# Patient Record
Sex: Female | Born: 1940
Health system: Southern US, Community
[De-identification: ages and names within clinical notes are randomized; demographics above are authoritative.]

## PROBLEM LIST (undated history)

## (undated) DIAGNOSIS — E039 Hypothyroidism, unspecified: Secondary | ICD-10-CM

## (undated) DIAGNOSIS — E782 Mixed hyperlipidemia: Secondary | ICD-10-CM

## (undated) DIAGNOSIS — I4891 Unspecified atrial fibrillation: Secondary | ICD-10-CM

## (undated) DIAGNOSIS — I350 Nonrheumatic aortic (valve) stenosis: Secondary | ICD-10-CM

## (undated) DIAGNOSIS — L409 Psoriasis, unspecified: Secondary | ICD-10-CM

## (undated) DIAGNOSIS — M1990 Unspecified osteoarthritis, unspecified site: Secondary | ICD-10-CM

## (undated) DIAGNOSIS — I10 Essential (primary) hypertension: Secondary | ICD-10-CM

## (undated) DIAGNOSIS — I2510 Atherosclerotic heart disease of native coronary artery without angina pectoris: Secondary | ICD-10-CM

## (undated) HISTORY — DX: Essential (primary) hypertension: I10

## (undated) HISTORY — DX: Unspecified osteoarthritis, unspecified site: M19.90

## (undated) HISTORY — DX: Atherosclerotic heart disease of native coronary artery without angina pectoris: I25.10

## (undated) HISTORY — DX: Mixed hyperlipidemia: E78.2

## (undated) HISTORY — DX: Nonrheumatic aortic (valve) stenosis: I35.0

## (undated) HISTORY — DX: Unspecified atrial fibrillation: I48.91

## (undated) HISTORY — DX: Hypothyroidism, unspecified: E03.9

## (undated) HISTORY — PX: CHOLECYSTECTOMY: SHX55

---

## 1968-06-24 HISTORY — PX: TOTAL THYROIDECTOMY: SHX2547

## 2001-01-22 ENCOUNTER — Encounter: Admission: RE | Admit: 2001-01-22 | Discharge: 2001-01-22 | Payer: Self-pay

## 2001-02-05 ENCOUNTER — Encounter: Payer: Self-pay | Admitting: Unknown Physician Specialty

## 2001-02-05 ENCOUNTER — Encounter: Admission: RE | Admit: 2001-02-05 | Discharge: 2001-02-05 | Payer: Self-pay | Admitting: Unknown Physician Specialty

## 2001-02-19 ENCOUNTER — Encounter: Payer: Self-pay | Admitting: Unknown Physician Specialty

## 2001-02-19 ENCOUNTER — Encounter: Admission: RE | Admit: 2001-02-19 | Discharge: 2001-02-19 | Payer: Self-pay | Admitting: Unknown Physician Specialty

## 2004-07-31 ENCOUNTER — Ambulatory Visit: Payer: Self-pay | Admitting: Cardiology

## 2004-09-10 ENCOUNTER — Ambulatory Visit: Payer: Self-pay | Admitting: Cardiology

## 2005-04-09 ENCOUNTER — Ambulatory Visit: Payer: Self-pay | Admitting: Cardiology

## 2005-06-25 ENCOUNTER — Ambulatory Visit: Payer: Self-pay | Admitting: Cardiology

## 2005-10-16 ENCOUNTER — Ambulatory Visit: Payer: Self-pay | Admitting: Cardiology

## 2005-10-28 ENCOUNTER — Ambulatory Visit: Payer: Self-pay | Admitting: Cardiology

## 2007-01-15 ENCOUNTER — Ambulatory Visit: Payer: Self-pay | Admitting: Cardiology

## 2007-02-03 ENCOUNTER — Ambulatory Visit: Payer: Self-pay | Admitting: Cardiology

## 2007-03-18 ENCOUNTER — Ambulatory Visit: Payer: Self-pay | Admitting: Gastroenterology

## 2007-03-18 LAB — CONVERTED CEMR LAB
INR: 0.9 (ref 0.8–1.0)
Prothrombin Time: 11.2 s (ref 10.9–13.3)
aPTT: 26.2 s (ref 21.7–29.8)

## 2007-03-26 ENCOUNTER — Ambulatory Visit (HOSPITAL_COMMUNITY): Admission: RE | Admit: 2007-03-26 | Discharge: 2007-03-26 | Payer: Self-pay | Admitting: Gastroenterology

## 2007-03-26 ENCOUNTER — Encounter: Payer: Self-pay | Admitting: Gastroenterology

## 2007-04-06 ENCOUNTER — Ambulatory Visit: Payer: Self-pay | Admitting: Gastroenterology

## 2007-06-25 HISTORY — PX: AORTIC VALVE REPLACEMENT: SHX41

## 2007-06-25 HISTORY — PX: CORONARY ARTERY BYPASS GRAFT: SHX141

## 2007-07-15 ENCOUNTER — Ambulatory Visit: Payer: Self-pay | Admitting: Cardiology

## 2007-09-04 DIAGNOSIS — Z8679 Personal history of other diseases of the circulatory system: Secondary | ICD-10-CM | POA: Insufficient documentation

## 2007-09-04 DIAGNOSIS — I359 Nonrheumatic aortic valve disorder, unspecified: Secondary | ICD-10-CM

## 2007-09-24 ENCOUNTER — Ambulatory Visit: Payer: Self-pay | Admitting: Cardiology

## 2007-11-24 ENCOUNTER — Ambulatory Visit: Payer: Self-pay | Admitting: Cardiology

## 2007-12-08 ENCOUNTER — Inpatient Hospital Stay (HOSPITAL_BASED_OUTPATIENT_CLINIC_OR_DEPARTMENT_OTHER): Admission: RE | Admit: 2007-12-08 | Discharge: 2007-12-08 | Payer: Self-pay | Admitting: Internal Medicine

## 2007-12-08 ENCOUNTER — Inpatient Hospital Stay (HOSPITAL_COMMUNITY): Admission: AD | Admit: 2007-12-08 | Discharge: 2007-12-23 | Payer: Self-pay | Admitting: Internal Medicine

## 2007-12-08 ENCOUNTER — Ambulatory Visit: Payer: Self-pay | Admitting: Internal Medicine

## 2007-12-09 ENCOUNTER — Encounter: Payer: Self-pay | Admitting: Internal Medicine

## 2007-12-10 ENCOUNTER — Encounter: Payer: Self-pay | Admitting: Thoracic Surgery (Cardiothoracic Vascular Surgery)

## 2007-12-12 ENCOUNTER — Encounter: Payer: Self-pay | Admitting: Cardiology

## 2007-12-15 ENCOUNTER — Ambulatory Visit: Payer: Self-pay | Admitting: Thoracic Surgery (Cardiothoracic Vascular Surgery)

## 2007-12-16 ENCOUNTER — Encounter: Payer: Self-pay | Admitting: Cardiology

## 2007-12-17 ENCOUNTER — Encounter: Payer: Self-pay | Admitting: Cardiology

## 2007-12-30 ENCOUNTER — Ambulatory Visit: Payer: Self-pay | Admitting: Thoracic Surgery (Cardiothoracic Vascular Surgery)

## 2008-01-05 ENCOUNTER — Ambulatory Visit: Payer: Self-pay | Admitting: Cardiology

## 2008-01-06 ENCOUNTER — Encounter: Payer: Self-pay | Admitting: Cardiology

## 2008-01-11 ENCOUNTER — Ambulatory Visit: Payer: Self-pay | Admitting: Thoracic Surgery (Cardiothoracic Vascular Surgery)

## 2008-01-18 ENCOUNTER — Ambulatory Visit: Payer: Self-pay | Admitting: Thoracic Surgery (Cardiothoracic Vascular Surgery)

## 2008-02-01 ENCOUNTER — Encounter
Admission: RE | Admit: 2008-02-01 | Discharge: 2008-02-01 | Payer: Self-pay | Admitting: Thoracic Surgery (Cardiothoracic Vascular Surgery)

## 2008-02-01 ENCOUNTER — Ambulatory Visit: Payer: Self-pay | Admitting: Thoracic Surgery (Cardiothoracic Vascular Surgery)

## 2008-02-08 ENCOUNTER — Encounter: Payer: Self-pay | Admitting: Cardiology

## 2008-02-16 ENCOUNTER — Ambulatory Visit: Payer: Self-pay | Admitting: Cardiology

## 2008-02-22 ENCOUNTER — Ambulatory Visit: Payer: Self-pay | Admitting: Thoracic Surgery (Cardiothoracic Vascular Surgery)

## 2008-03-14 ENCOUNTER — Ambulatory Visit: Payer: Self-pay | Admitting: Thoracic Surgery (Cardiothoracic Vascular Surgery)

## 2008-03-28 ENCOUNTER — Ambulatory Visit: Payer: Self-pay | Admitting: Thoracic Surgery (Cardiothoracic Vascular Surgery)

## 2008-04-01 ENCOUNTER — Ambulatory Visit (HOSPITAL_COMMUNITY)
Admission: RE | Admit: 2008-04-01 | Discharge: 2008-04-01 | Payer: Self-pay | Admitting: Thoracic Surgery (Cardiothoracic Vascular Surgery)

## 2008-04-01 ENCOUNTER — Ambulatory Visit: Payer: Self-pay | Admitting: Thoracic Surgery (Cardiothoracic Vascular Surgery)

## 2008-04-05 ENCOUNTER — Ambulatory Visit: Payer: Self-pay | Admitting: Thoracic Surgery (Cardiothoracic Vascular Surgery)

## 2008-04-18 ENCOUNTER — Ambulatory Visit: Payer: Self-pay | Admitting: Cardiology

## 2008-04-25 ENCOUNTER — Ambulatory Visit: Payer: Self-pay | Admitting: Thoracic Surgery (Cardiothoracic Vascular Surgery)

## 2008-06-06 ENCOUNTER — Ambulatory Visit: Payer: Self-pay | Admitting: Thoracic Surgery (Cardiothoracic Vascular Surgery)

## 2008-07-04 ENCOUNTER — Encounter
Admission: RE | Admit: 2008-07-04 | Discharge: 2008-07-04 | Payer: Self-pay | Admitting: Thoracic Surgery (Cardiothoracic Vascular Surgery)

## 2008-07-04 ENCOUNTER — Ambulatory Visit: Payer: Self-pay | Admitting: Thoracic Surgery (Cardiothoracic Vascular Surgery)

## 2008-07-13 ENCOUNTER — Ambulatory Visit: Payer: Self-pay | Admitting: Thoracic Surgery (Cardiothoracic Vascular Surgery)

## 2008-07-13 ENCOUNTER — Ambulatory Visit (HOSPITAL_COMMUNITY)
Admission: RE | Admit: 2008-07-13 | Discharge: 2008-07-13 | Payer: Self-pay | Admitting: Thoracic Surgery (Cardiothoracic Vascular Surgery)

## 2008-08-01 ENCOUNTER — Ambulatory Visit: Payer: Self-pay | Admitting: Thoracic Surgery (Cardiothoracic Vascular Surgery)

## 2008-08-15 ENCOUNTER — Ambulatory Visit: Payer: Self-pay | Admitting: Thoracic Surgery (Cardiothoracic Vascular Surgery)

## 2008-09-05 ENCOUNTER — Ambulatory Visit: Payer: Self-pay | Admitting: Thoracic Surgery (Cardiothoracic Vascular Surgery)

## 2008-09-28 ENCOUNTER — Ambulatory Visit: Payer: Self-pay | Admitting: Cardiology

## 2008-11-07 ENCOUNTER — Encounter: Payer: Self-pay | Admitting: Cardiology

## 2008-11-07 ENCOUNTER — Ambulatory Visit: Payer: Self-pay | Admitting: Thoracic Surgery (Cardiothoracic Vascular Surgery)

## 2008-11-14 ENCOUNTER — Encounter: Payer: Self-pay | Admitting: Cardiology

## 2009-04-06 DIAGNOSIS — E785 Hyperlipidemia, unspecified: Secondary | ICD-10-CM | POA: Insufficient documentation

## 2009-04-06 DIAGNOSIS — I10 Essential (primary) hypertension: Secondary | ICD-10-CM | POA: Insufficient documentation

## 2009-07-12 ENCOUNTER — Telehealth (INDEPENDENT_AMBULATORY_CARE_PROVIDER_SITE_OTHER): Payer: Self-pay | Admitting: *Deleted

## 2009-07-14 ENCOUNTER — Telehealth (INDEPENDENT_AMBULATORY_CARE_PROVIDER_SITE_OTHER): Payer: Self-pay | Admitting: *Deleted

## 2009-07-15 IMAGING — CR DG CHEST 2V
2 series · 2 of 2 positions shown · non-contrast
Comparison: None available.

CLINICAL DATA: Pre CABG.

CHEST - 2 VIEW

[w chest pa]
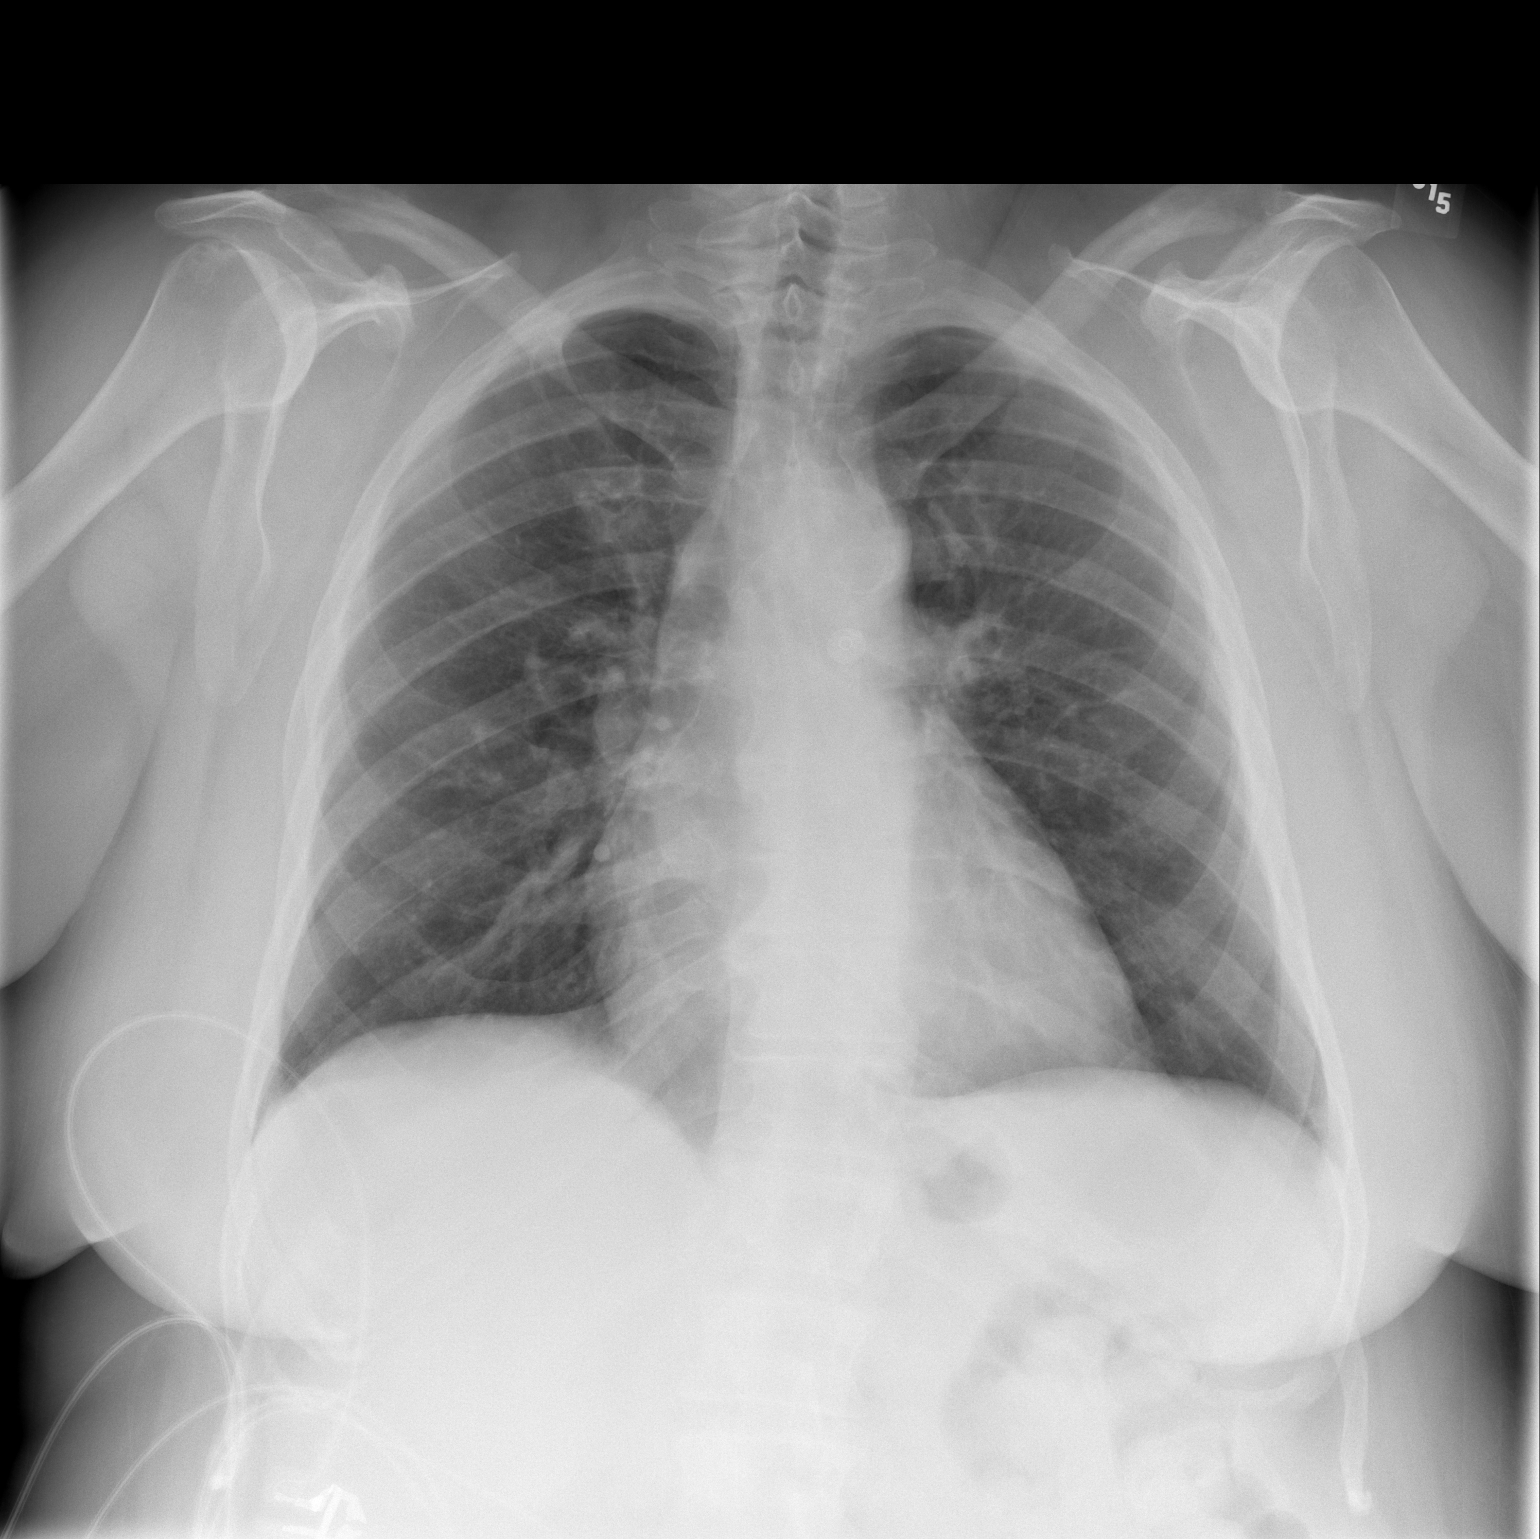

[w chest lat]
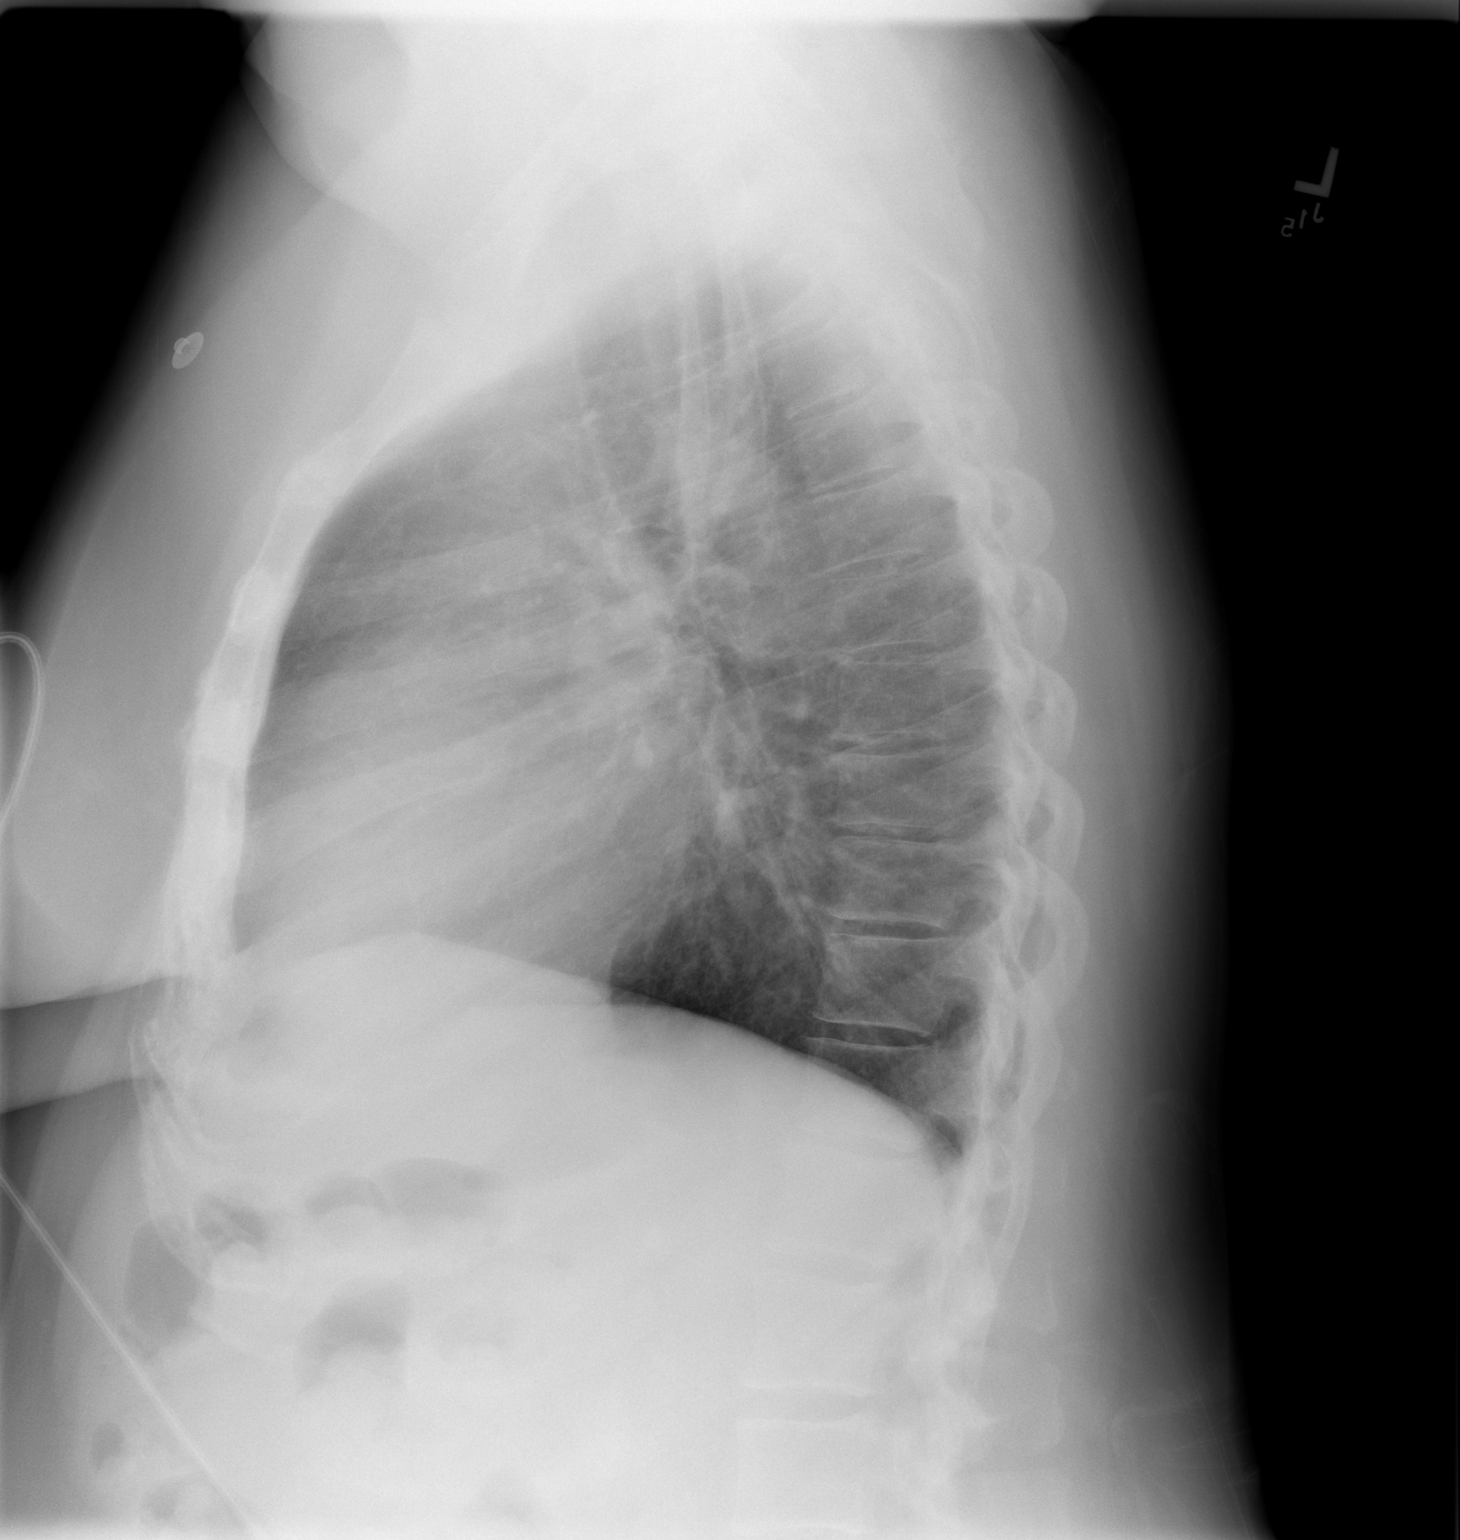

[2 of 2 positions shown; findings below may reference images not displayed]

FINDINGS: Trachea is midline.  Heart is at the upper limits of
normal in size.  Lungs are clear.  No pleural fluid.
IMPRESSION: No acute findings.

## 2009-07-17 IMAGING — CR DG CHEST 1V PORT
1 series · 1 of 1 positions shown · non-contrast
Comparison: 12/10/2007.

CLINICAL DATA: Postop CABG.

PORTABLE CHEST - 1 VIEW

[view not recorded]
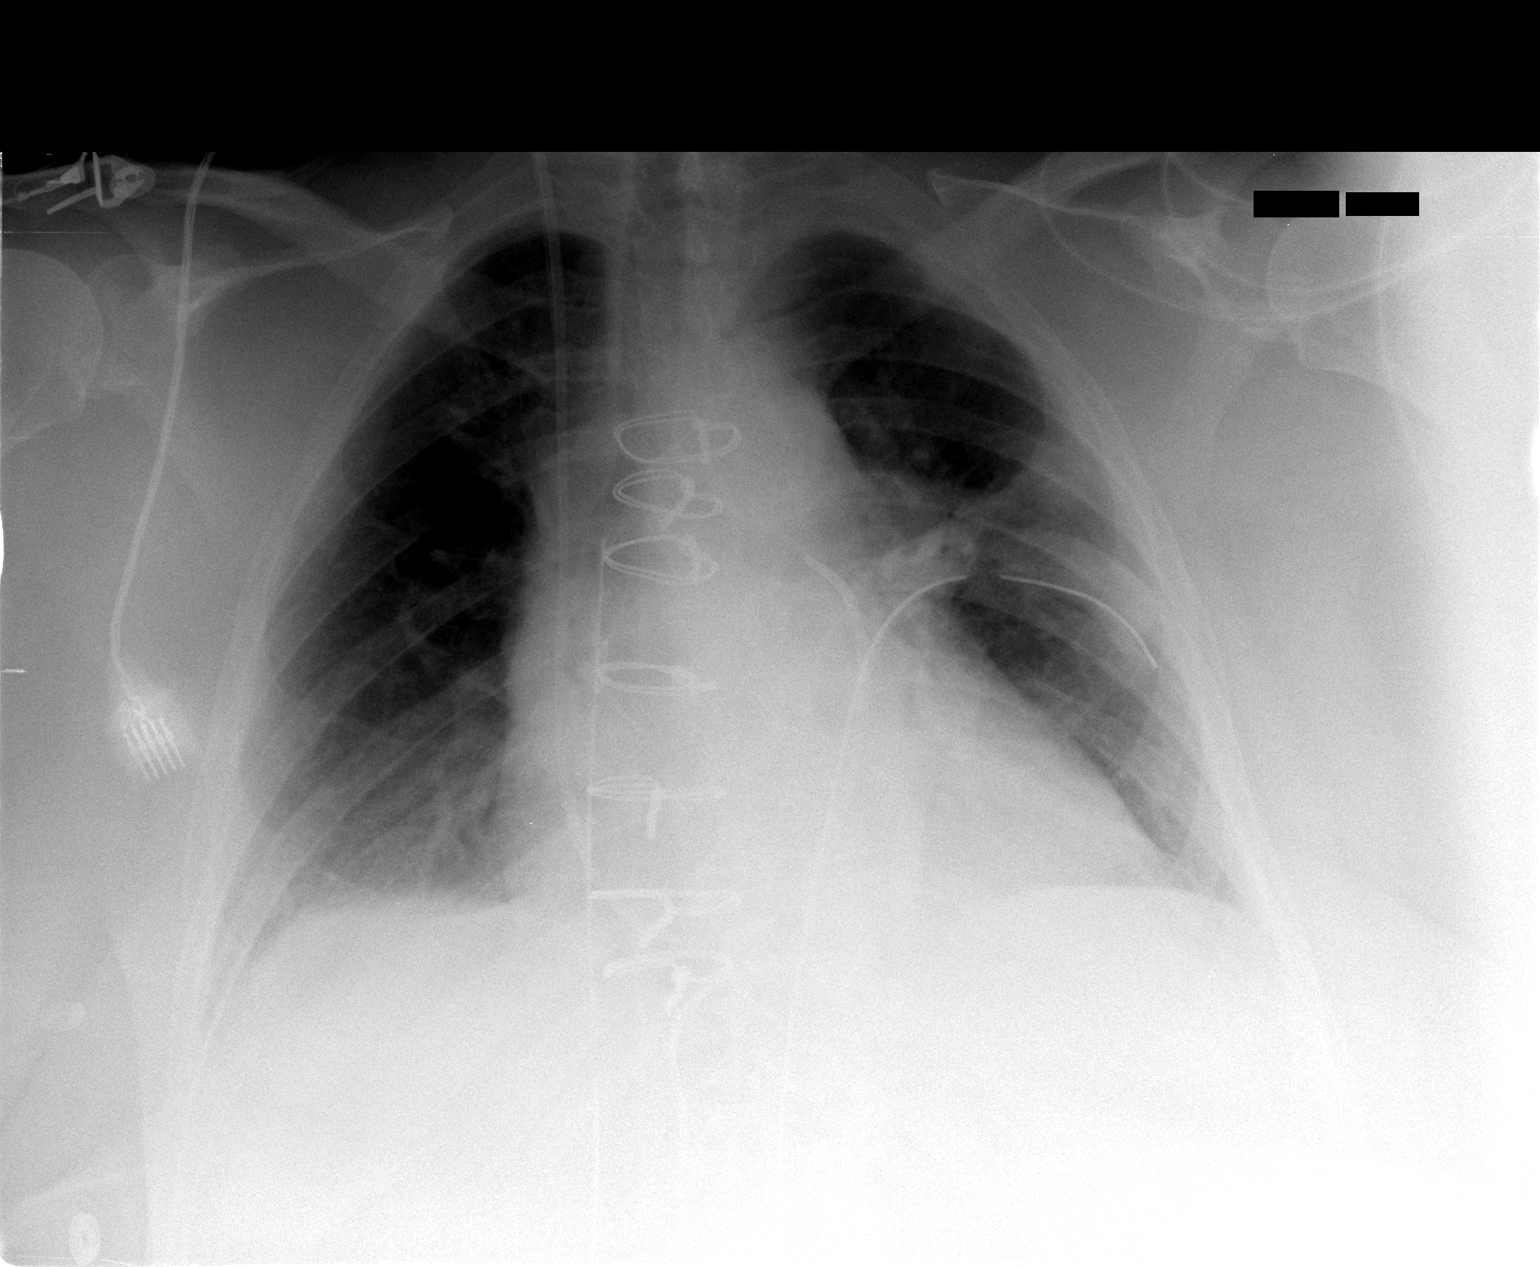

[1 of 1 positions shown; findings below may reference images not displayed]

FINDINGS: 5544 hours.  There has been interval extubation with
removal of the nasogastric tube.  A Swan-Ganz catheter and
bilateral chest tubes are in stable position.  The basilar aeration
has improved with mild residual basilar atelectasis and vascular
congestion.  There is no pneumothorax or significant pleural
effusion.  The heart size and mediastinal contours are stable.
IMPRESSION: 1.  Improved basilar aeration following extubation.
2.  No pneumothorax.

## 2009-07-18 IMAGING — CR DG CHEST 2V
2 series · 2 of 2 positions shown · non-contrast
Comparison: Yesterday's exam

CLINICAL DATA: CABG

CHEST - 2 VIEW

[w chest pa]
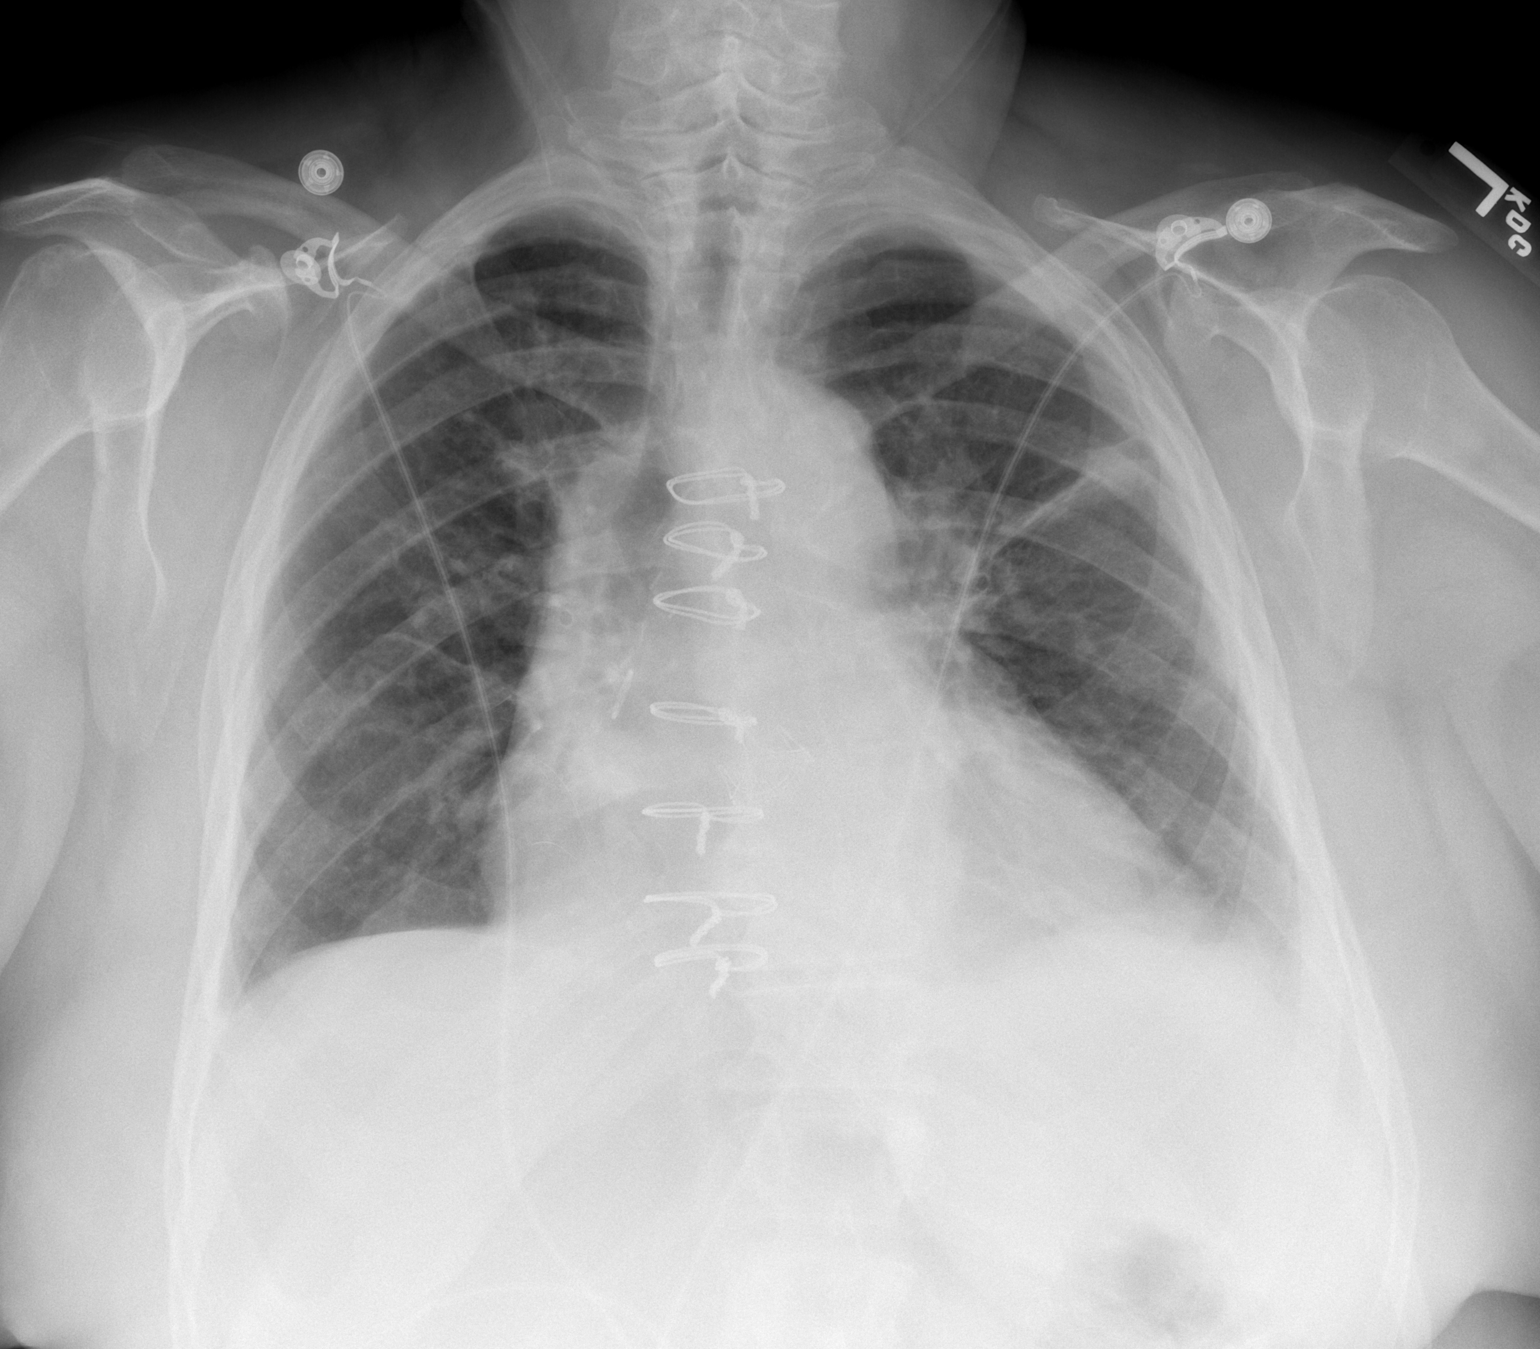

[w chest lat]
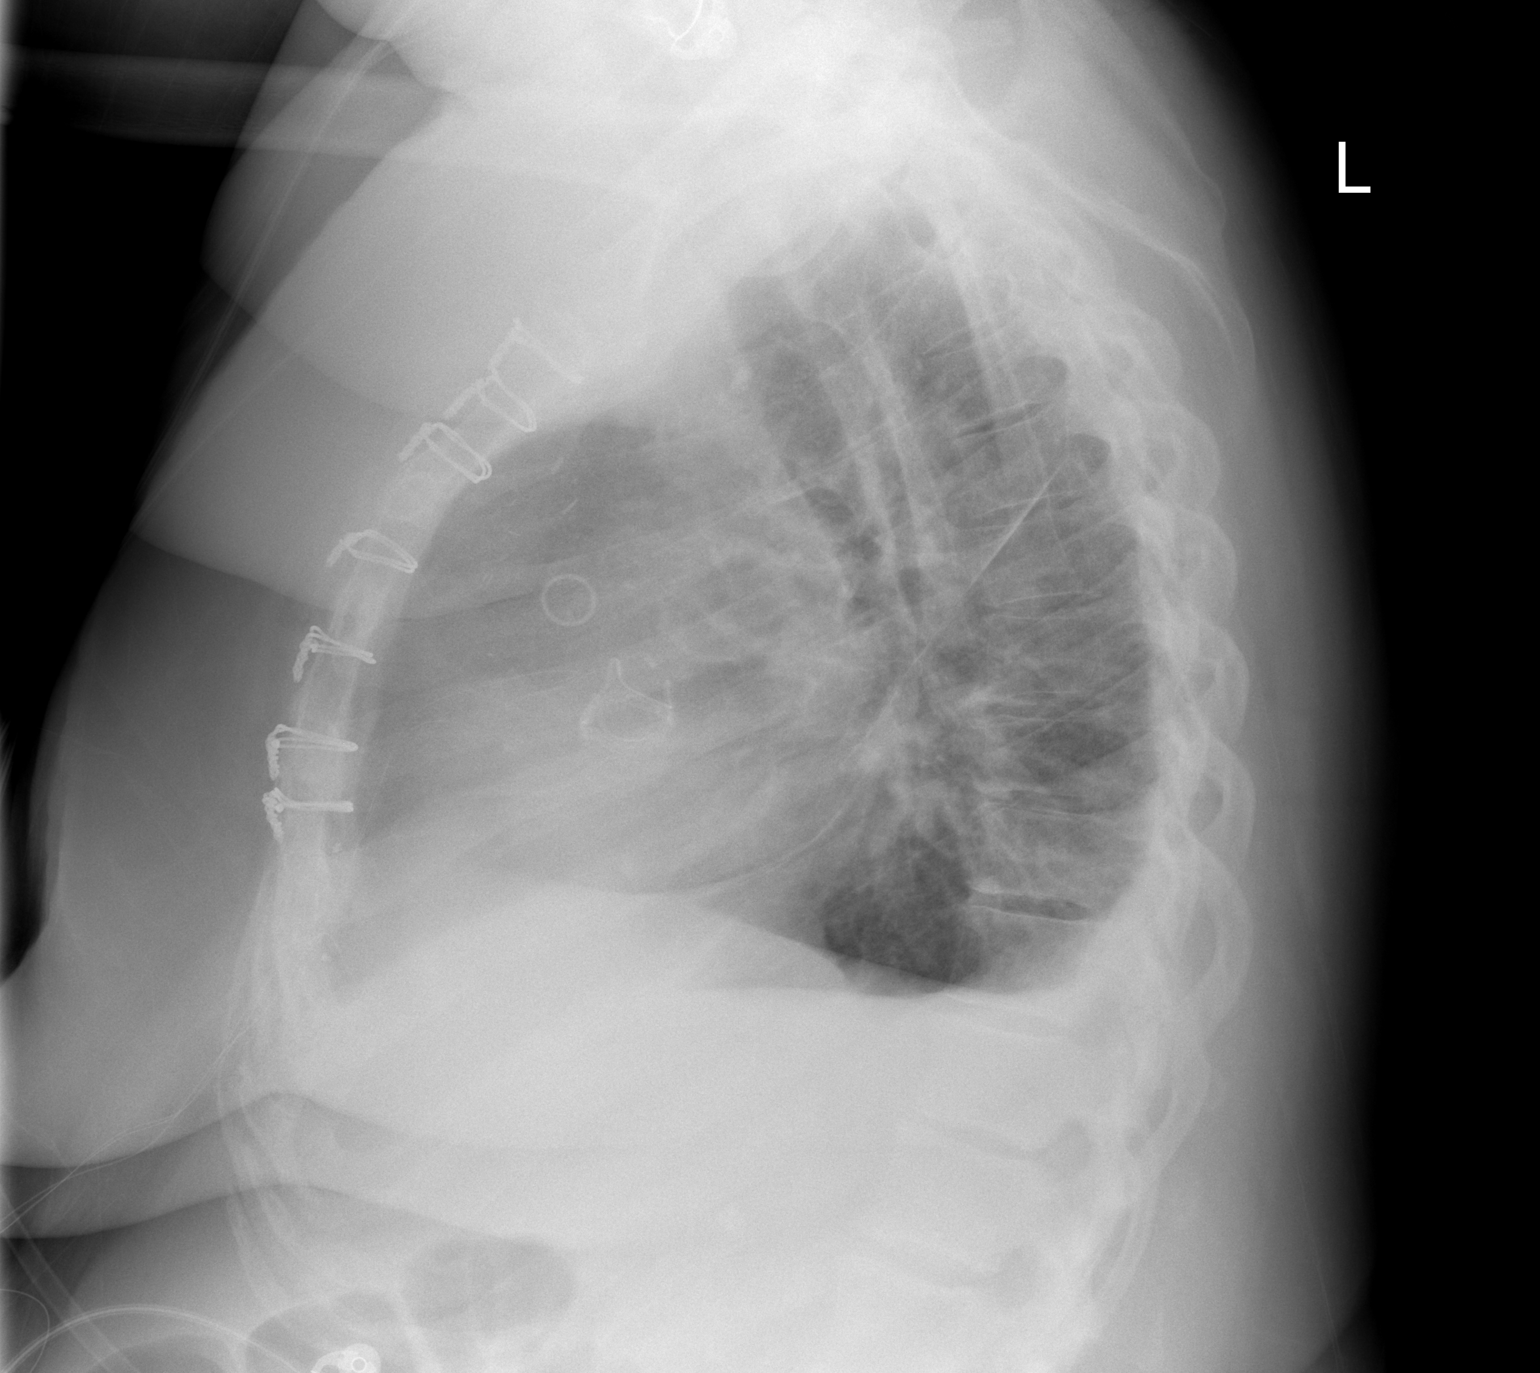

[2 of 2 positions shown; findings below may reference images not displayed]

FINDINGS: Chest tubes and SGC have been removed. Small left pleural
effusion.  Subsegmental atelectasis left upper lung zone.  No
pneumothorax.  Cardiomegaly.  Stable appearance of
cardiomediastinal silhouette.
IMPRESSION: Minimal left pleural effusion and subsegmental atelectasis left
upper lung zone.

## 2009-07-27 ENCOUNTER — Encounter: Payer: Self-pay | Admitting: Cardiology

## 2009-08-16 ENCOUNTER — Encounter: Payer: Self-pay | Admitting: Cardiology

## 2009-08-18 ENCOUNTER — Encounter: Payer: Self-pay | Admitting: Cardiology

## 2009-09-01 ENCOUNTER — Ambulatory Visit: Payer: Self-pay | Admitting: Cardiology

## 2009-09-01 DIAGNOSIS — R5381 Other malaise: Secondary | ICD-10-CM

## 2009-09-01 DIAGNOSIS — I2510 Atherosclerotic heart disease of native coronary artery without angina pectoris: Secondary | ICD-10-CM

## 2009-09-01 DIAGNOSIS — R5383 Other fatigue: Secondary | ICD-10-CM

## 2010-01-29 ENCOUNTER — Encounter: Payer: Self-pay | Admitting: Cardiology

## 2010-01-30 ENCOUNTER — Encounter: Payer: Self-pay | Admitting: Cardiology

## 2010-01-31 ENCOUNTER — Encounter: Payer: Self-pay | Admitting: Cardiology

## 2010-02-02 ENCOUNTER — Encounter: Payer: Self-pay | Admitting: Cardiology

## 2010-03-01 ENCOUNTER — Ambulatory Visit: Payer: Self-pay | Admitting: Cardiology

## 2010-06-24 HISTORY — PX: JOINT REPLACEMENT: SHX530

## 2010-07-24 NOTE — Assessment & Plan Note (Signed)
Summary: [name]   Visit Type:  Follow-up Primary Provider:  Dr. Doreen Beam   History of Present Illness: 70 year old woman presents for a followup visit. She has been evaluated by Dr. Sherril Croon for shortness of breath since our last visit in the office. She reportedly had pulmonary function tests, and was ultimately referred for a CT scan of the chest on 2/25 that revealed a 5 mm left lower lobe nodule, no other acute process. She states that she was treated for an apparent upper respiratory tract infection with antibiotics, and is beginning to feel better.  She remains chronically hoarse, and has seen an otolaryngologist. She states that her vocal cords "do not meet" and that she does not plan to undergo any invasive surgery.  Recent labs from February 3 revealed BUN 24, creatinine 1.3, AST 23, ALT 11, sodium 143, potassium 4.5, TSH 2.3, hemoglobin 12.1, platelets 227.  [name] complains of being fatigued in general and with poor energy. She admits to no exercise, remains overweight, and also is functionally limited by arthritic knee pain. Today I talked with her about strategies with trying to increase her aerobic fitness with a goal for weight loss.    Preventive Screening-Counseling & Management  Alcohol-Tobacco     Smoking Status: quit     Year Started: ?     Year Quit: 15 yr ago  Current Medications (verified): 1)  Metoprolol Succinate 25 Mg Xr24h-Tab (Metoprolol Succinate) .... Take One Tablet By Mouth Daily 2)  Poly-Iron 150 150 Mg Caps (Polysaccharide Iron Complex) .... Take 1 Capsule By Mouth Once A Day 3)  Potassium Chloride Crys Cr 20 Meq Cr-Tabs (Potassium Chloride Crys Cr) .... Take 1 Tablet By Mouth Once A Day 4)  Oscal 500/200 D-3 500-200 Mg-Unit Tabs (Calcium-Vitamin D) .... Take 1 Tablet By Mouth Two Times A Day 5)  Glucosamine-Chondroitin  Caps (Glucosamine-Chondroit-Vit C-Mn) .... Take 1 Tablet By Mouth Two Times A Day 6)   Centrum Silver  Tabs (Multiple Vitamins-Minerals) .... Take 1 Tablet By Mouth Once A Day 7)  Methotrexate 2.5 Mg Tabs (Methotrexate Sodium) .... One Tab On Monday, Wednesday, Friday 8)  Meloxicam 7.5 Mg Tabs (Meloxicam) .... Take 1 Tablet By Mouth Two Times A Day 9)  Levothroid 88 Mcg Tabs (Levothyroxine Sodium) .... Take 1 Tablet By Mouth Once A Day 10)  Zetia 10 Mg Tabs (Ezetimibe) .... Take 1 Tablet By Mouth Once A Day 11)  Folic Acid 1 Mg Tabs (Folic Acid) .... Take 1 Tablet By Mouth Once A Day 12)  Tricor 145 Mg Tabs (Fenofibrate) .... Take 1 Tablet By Mouth Once A Day 13)  Furosemide 40 Mg Tabs (Furosemide) .... Take 1 Tablet By Mouth Once A Day 14)  Fish Oil 1000 Mg Caps (Omega-3 Fatty Acids) .... Take 1 Tablet By Mouth Two Times A Day 15)  Aspir-Trin 325 Mg Tbec (Aspirin) .... Take 1 Tablet By Mouth Once A Day 16)  Darvocet N 100 .... Rarely Use As Needed  Allergies: 1)  ! * Statins 2)  Penicillin V Potassium (Penicillin V Potassium)  Past History:  Past Medical History: Last updated: 08/30/2009 Arthritis Hepatitis-Non Viral Aortic Stenosis Atrial Fibrillation - postoperative, transiently on Coumadin Hyperlipidemia Hypertension Hypothyroidism  Past Surgical History: Last updated: 08/30/2009 CABG 6/09 - LIMA to LAD, SVG to RCA AVR 6/09 - 21 mm Edwards Magna pericardial valve Subtotal thyroidectomy  Social History: Last updated: 08/30/2009 Retired  Tobacco Use - Former Widowed  Alcohol Use - no   Review of Systems       The patient complains of weight gain and hoarseness.  The patient denies anorexia, fever, chest pain, syncope, peripheral edema, prolonged cough, headaches, melena, and hematochezia.         Otherwise reviewed and negative except as outlined above.  Vital Signs:  Patient profile:   70 year old female Height:      63 inches Weight:      256 pounds BMI:     45.51 O2 Sat:      94 % Pulse rate:   69 / minute BP sitting:   137 / 70  (left  arm) Cuff size:   large  Vitals Entered By: Hoover Brunette, LPN (September 01, 2009 10:58 AM)  Nutrition Counseling: Patient's BMI is greater than 25 and therefore counseled on weight management options. Is Patient Diabetic? No Comments SOB x 6 months +, no energy   Physical Exam  Additional Exam:  Morbidly obese woman in no acute distress. HEENT: Conjunctiva and lids normal, oropharynx with moist mucosa. Neck: Supple, no elevated venous pressure bruits. Lungs: Diminished but clear. Cardiac: Regular rate and rhythm with 2/6 systolic murmur, no S3 gallop. Abdomen: Obese, nontender. Extremity: Trace ankle edema, nonpitting.   EKG  Procedure date:  09/01/2009  Findings:      Sinus rhythm at 70 beats per minute with occasional PVCs, small R prime in lead V1 and V2, nonspecific ST changes.  Impression & Recommendations:  Problem # 1:  AORTIC STENOSIS (ICD-424.1)  Status post bioprosthetic aortic valve replacement in June 2009 with preserved left ventricular systolic function.  Her updated medication list for this problem includes:    Metoprolol Succinate 25 Mg Xr24h-tab (Metoprolol succinate) .Marland Kitchen... Take one tablet by mouth daily    Furosemide 40 Mg Tabs (Furosemide) .Marland Kitchen... Take 1 tablet by mouth once a day  Problem # 2:  HYPERLIPIDEMIA-MIXED (ICD-272.4)  Fasting lipid profile and liver function tests will be obtained prior to the next visit in 6 months.  Her updated medication list for this problem includes:    Zetia 10 Mg Tabs (Ezetimibe) .Marland Kitchen... Take 1 tablet by mouth once a day    Tricor 145 Mg Tabs (Fenofibrate) .Marland Kitchen... Take 1 tablet by mouth once a day  Problem # 3:  CORONARY ATHEROSCLEROSIS NATIVE CORONARY ARTERY (ICD-414.01)  Status post coronary artery bypass grafting at the time of aortic valve replacement. No active angina.  The following medications were removed from the medication list:    Warfarin Sodium 2.5 Mg Tabs (Warfarin sodium) .Marland Kitchen... Take by mouth as directed Her  updated medication list for this problem includes:    Metoprolol Succinate 25 Mg Xr24h-tab (Metoprolol succinate) .Marland Kitchen... Take one tablet by mouth daily    Aspir-trin 325 Mg Tbec (Aspirin) .Marland Kitchen... Take 1 tablet by mouth once a day  Problem # 4:  FATIGUE (ICD-780.79)  I expect that this is multifactorial related to the patient's relative inactivity, functional limitations with arthritic knee pain, and also morbid obesity. Today we talked about some strategies to address this.  Other Orders: EKG w/ Interpretation (93000)  Patient Instructions: 1)  Your physician wants you to follow-up in: 6 months. You will receive a reminder letter in the mail one-two months in advance. If you don't receive a letter, please call our office to schedule the follow-up appointment. 2)  Your physician recommends that you go to the [facility] for a FASTING lipid profile and liver function labs. Do  not eat or drink after midnight. WE WILL DO THIS LAB WORK IN 6 MONTHS BEFORE YOUR NEXT VISIT. 3)  Your physician recommends that you continue on your current medications as directed. Please refer to the Current Medication list given to you today.

## 2010-07-24 NOTE — Letter (Signed)
Summary: [facility] D/C DR. DHRUV VYAS  [facility] D/C DR. DHRUV VYAS   Imported By: Zachary George 02/28/2010 13:26:17  _____________________________________________________________________  External Attachment:    Type:   Image     Comment:   External Document

## 2010-07-24 NOTE — Assessment & Plan Note (Signed)
Summary: 6 mo fu per sept reminder-srs   Visit Type:  Follow-up Primary Provider:  Dr. Doreen Beam   History of Present Illness: 70 year old woman presents for followup. I last saw her back in March. Records indicate she was admitted to [facility] in August with acute cholecystitis requiring cholecystectomy. She states she is back to baseline now.  She states that her daughter died over the summer and she is still grieving. Ambulation remains limited by arthritic pain, and she uses a cane.  Recent labs from August showed creatinine 1.2, BUN 19, AST 19, ALT 8, potassium 4.4, hemoglobin 12.9, platelets 290. No recent lipids noted.  No active angina.   Current Medications (verified): 1)  Metoprolol Succinate 25 Mg Xr24h-Tab (Metoprolol Succinate) .... Take One Tablet By Mouth Daily 2)  Poly-Iron 150 150 Mg Caps (Polysaccharide Iron Complex) .... Take 1 Capsule By Mouth Once A Day 3)  Potassium Chloride Crys Cr 20 Meq Cr-Tabs (Potassium Chloride Crys Cr) .... Take 1 Tablet By Mouth Once A Day 4)  Oscal 500/200 D-3 500-200 Mg-Unit Tabs (Calcium-Vitamin D) .... Take 1 Tablet By Mouth Two Times A Day 5)  Glucosamine-Chondroitin  Caps (Glucosamine-Chondroit-Vit C-Mn) .... Take 1 Tablet By Mouth Two Times A Day 6)  Centrum Silver  Tabs (Multiple Vitamins-Minerals) .... Take 1 Tablet By Mouth Once A Day 7)  Methotrexate 2.5 Mg Tabs (Methotrexate Sodium) .... One Tab On Monday, Wednesday, Friday 8)  Meloxicam 7.5 Mg Tabs (Meloxicam) .... Take 1 Tablet By Mouth Two Times A Day 9)  Levothroid 88 Mcg Tabs (Levothyroxine Sodium) .... Take 1 Tablet By Mouth Once A Day 10)  Zetia 10 Mg Tabs (Ezetimibe) .... Take 1 Tablet By Mouth Once A Day 11)  Folic Acid 1 Mg Tabs (Folic Acid) .... Take 1 Tablet By Mouth Once A Day 12)  Tricor 145 Mg Tabs (Fenofibrate) .... Take 1 Tablet By Mouth Once A Day 13)  Furosemide 40 Mg Tabs (Furosemide) .... Take 1 Tablet By Mouth Once A Day 14)  Fish Oil 1000 Mg Caps (Omega-3  Fatty Acids) .... Take 1 Tablet By Mouth Two Times A Day 15)  Aspir-Trin 325 Mg Tbec (Aspirin) .... Take 1 Tablet By Mouth Once A Day 16)  Darvocet N 100 .... Rarely Use As Needed  Allergies (verified): 1)  ! * Statins 2)  Penicillin V Potassium (Penicillin V Potassium)  Past History:  Past Surgical History: Last updated: 08/30/2009 CABG 6/09 - LIMA to LAD, SVG to RCA AVR 6/09 - 21 mm Edwards Magna pericardial valve Subtotal thyroidectomy  Social History: Last updated: 08/30/2009 Retired  Tobacco Use - Former Widowed  Alcohol Use - no  Past Medical History: Arthritis Hepatitis-Non Viral Aortic Stenosis Atrial Fibrillation - postoperative, transiently on Coumadin Hyperlipidemia Hypertension Hypothyroidism CAD  Review of Systems  The patient denies anorexia, fever, weight loss, chest pain, syncope, dyspnea on exertion, peripheral edema, melena, and hematochezia.         Otherwise reviewed and negative.  Vital Signs:  Patient profile:   70 year old female Height:      63 inches Weight:      257 pounds BMI:     45.69 Pulse rate:   73 / minute Resp:     18 per minute BP sitting:   134 / 78  (right arm)  Vitals Entered By: Marrion Coy, CNA (March 01, 2010 10:58 AM)  Physical Exam  Additional Exam:  Morbidly obese woman in no acute distress. HEENT: Conjunctiva and lids  normal, oropharynx with moist mucosa. Neck: Supple, no elevated venous pressure bruits. Lungs: Diminished but clear. Cardiac: Regular rate and rhythm with 2/6 systolic murmur, no S3 gallop. Abdomen: Obese, nontender. Extremity: Trace ankle edema, nonpitting.   Impression & Recommendations:  Problem # 1:  CORONARY ATHEROSCLEROSIS NATIVE CORONARY ARTERY (ICD-414.01)  No active anginal medical therapy. Followup in 6 months.  Her updated medication list for this problem includes:    Metoprolol Succinate 25 Mg Xr24h-tab (Metoprolol succinate) .Marland Kitchen... Take one tablet by mouth daily     Aspir-trin 325 Mg Tbec (Aspirin) .Marland Kitchen... Take 1 tablet by mouth once a day  Problem # 2:  AORTIC STENOSIS (ICD-424.1)  Status post pericardial tissue valve replacement in June 2009.  Her updated medication list for this problem includes:    Metoprolol Succinate 25 Mg Xr24h-tab (Metoprolol succinate) .Marland Kitchen... Take one tablet by mouth daily    Furosemide 40 Mg Tabs (Furosemide) .Marland Kitchen... Take 1 tablet by mouth once a day  Problem # 3:  HYPERTENSION, UNSPECIFIED (ICD-401.9)  Blood pressure reasonable today.  Her updated medication list for this problem includes:    Metoprolol Succinate 25 Mg Xr24h-tab (Metoprolol succinate) .Marland Kitchen... Take one tablet by mouth daily    Furosemide 40 Mg Tabs (Furosemide) .Marland Kitchen... Take 1 tablet by mouth once a day    Aspir-trin 325 Mg Tbec (Aspirin) .Marland Kitchen... Take 1 tablet by mouth once a day  Problem # 4:  HYPERLIPIDEMIA-MIXED (ICD-272.4)  Due to see Dr. Sherril Croon tomorrow for lab work.  Her updated medication list for this problem includes:    Zetia 10 Mg Tabs (Ezetimibe) .Marland Kitchen... Take 1 tablet by mouth once a day    Tricor 145 Mg Tabs (Fenofibrate) .Marland Kitchen... Take 1 tablet by mouth once a day  Patient Instructions: 1)  Your physician wants you to follow-up in: 6 months. You will receive a reminder letter in the mail one-two months in advance. If you don't receive a letter, please call our office to schedule the follow-up appointment. 2)  Your physician recommends that you continue on your current medications as directed. Please refer to the Current Medication list given to you today.

## 2010-07-24 NOTE — Progress Notes (Signed)
Summary: SOB, No Energy  Phone Note Call from Patient Call back at [facility] Phone [phone]   Summary of Call: Pt called with c/o SOB and no energy for about 1 month. Pt scheduled for 09/01/09 from previous phone call. Pt notified to see primary MD and have him evaluate for possible causes of symptoms. Notified pt if primary MD feels she needs to be seen by [place] sooner than 3/11 to have MD contact our office. Pt verbalized understanding.  Initial call taken by: Cyril Loosen, RN, BSN,  July 14, 2009 2:58 PM

## 2010-07-24 NOTE — Progress Notes (Signed)
Summary: PHONE: C/O SHORTNESS OF BREATH  Medications Added POLY-IRON 150 150 MG CAPS (POLYSACCHARIDE IRON COMPLEX) Take 1 capsule by mouth once a day POTASSIUM CHLORIDE CRYS CR 20 MEQ CR-TABS (POTASSIUM CHLORIDE CRYS CR) Take 1 tablet by mouth once a day TOPROL XL 25 MG XR24H-TAB (METOPROLOL SUCCINATE) Take 1 tablet by mouth once a day WARFARIN SODIUM 2.5 MG TABS (WARFARIN SODIUM) Take by mouth as directed      Allergies Added: PENICILLIN V POTASSIUM (PENICILLIN V POTASSIUM) Phone Note Call from Patient Call back at Home Phone [phone]   Caller: Patient Reason for Call: Acute Illness Details for Reason: Shortness of breath Summary of Call: c/o shortness of breath. Called stating I need an appointment with Dr. Diona Browner. I gave her 1st available which is in March. I ask patient if she wanted to discuss her shortness of breath with Dr.McDowell's nurse and she said no.  Initial call taken by: Zachary George,  July 12, 2009 11:14 AM   New Allergies: PENICILLIN V POTASSIUM (PENICILLIN V POTASSIUM) New/Updated Medications: POLY-IRON 150 150 MG CAPS (POLYSACCHARIDE IRON COMPLEX) Take 1 capsule by mouth once a day POTASSIUM CHLORIDE CRYS CR 20 MEQ CR-TABS (POTASSIUM CHLORIDE CRYS CR) Take 1 tablet by mouth once a day TOPROL XL 25 MG XR24H-TAB (METOPROLOL SUCCINATE) Take 1 tablet by mouth once a day WARFARIN SODIUM 2.5 MG TABS (WARFARIN SODIUM) Take by mouth as directed New Allergies: PENICILLIN V POTASSIUM (PENICILLIN V POTASSIUM)

## 2010-09-25 ENCOUNTER — Encounter: Payer: Self-pay | Admitting: Cardiology

## 2010-09-26 ENCOUNTER — Encounter: Payer: Self-pay | Admitting: *Deleted

## 2010-09-26 ENCOUNTER — Encounter: Payer: Self-pay | Admitting: Cardiology

## 2010-09-26 ENCOUNTER — Ambulatory Visit (INDEPENDENT_AMBULATORY_CARE_PROVIDER_SITE_OTHER): Payer: Medicare Other | Admitting: Cardiology

## 2010-09-26 VITALS — BP 123/69 | HR 65 | Ht 63.0 in | Wt 256.0 lb

## 2010-09-26 DIAGNOSIS — I359 Nonrheumatic aortic valve disorder, unspecified: Secondary | ICD-10-CM

## 2010-09-26 DIAGNOSIS — E785 Hyperlipidemia, unspecified: Secondary | ICD-10-CM

## 2010-09-26 DIAGNOSIS — I2510 Atherosclerotic heart disease of native coronary artery without angina pectoris: Secondary | ICD-10-CM

## 2010-09-26 DIAGNOSIS — I10 Essential (primary) hypertension: Secondary | ICD-10-CM

## 2010-09-26 NOTE — Assessment & Plan Note (Signed)
She reports followup lab work with Dr. Sherril Croon. Would aim for aggressive LDL control around 70.

## 2010-09-26 NOTE — Assessment & Plan Note (Signed)
Status post bioprosthetic aortic valve replacement in June 2009, stable.

## 2010-09-26 NOTE — Assessment & Plan Note (Signed)
No active angina, doing well on medical therapy.

## 2010-09-26 NOTE — Assessment & Plan Note (Signed)
Blood pressure is well-controlled today. 

## 2010-09-26 NOTE — Progress Notes (Signed)
Clinical Summary [name] is a 70 y.o.female presenting for routine followup. She was seen in September 2011.  She reports no angina or progressive shortness of breath. She remains functionally limited by bilateral knee arthritis, left worse than right. She states that she had an orthopedic surgical consultation, and surgery was not recommended. She is considering a second opinion. Continues to ambulate with a cane.  She reports blood work with Dr. Sherril [name] within the last few months.   Allergies  Allergen Reactions  . Penicillins     REACTION: rash: swelling  . Statins     REACTION: MUSCLE ACHE    Current outpatient prescriptions:aspirin 325 MG tablet, Take 325 mg by mouth daily.  , Disp: , Rfl: ;  calcium-vitamin D (OSCAL 500/200 D-3) 500-200 MG-UNIT per tablet, Take 1 tablet by mouth 2 (two) times daily.  , Disp: , Rfl: ;  ergocalciferol (VITAMIN D2) 50000 UNITS capsule, Take 50,000 Units by mouth once a week.  , Disp: , Rfl: ;  ezetimibe (ZETIA) 10 MG tablet, Take 10 mg by mouth daily.  , Disp: , Rfl:  fenofibrate (TRICOR) 145 MG tablet, Take 145 mg by mouth daily.  , Disp: , Rfl: ;  folic acid (FOLVITE) 1 MG tablet, Take 1 mg by mouth daily.  , Disp: , Rfl: ;  furosemide (LASIX) 40 MG tablet, Take 40 mg by mouth daily.  , Disp: , Rfl: ;  Glucosamine-Chondroit-Vit C-Mn (GLUCOSAMINE-CHONDROITIN) CAPS, Take by mouth 2 (two) times daily.  , Disp: , Rfl:  HYDROcodone-acetaminophen (VICODIN) 5-500 MG per tablet, Take 1 tablet by mouth 2 (two) times daily.  , Disp: , Rfl: ;  levothyroxine (SYNTHROID, LEVOTHROID) 88 MCG tablet, Take 88 mcg by mouth daily.  , Disp: , Rfl: ;  methotrexate (RHEUMATREX) 2.5 MG tablet, Take 7.5 mg by mouth once a week. Caution:Chemotherapy. Protect from light. , Disp: , Rfl: ;  metoprolol (TOPROL XL) 50 MG 24 hr tablet, Take 50 mg by mouth daily.  , Disp: , Rfl:  Multiple Vitamins-Minerals (CENTRUM SILVER PO), Take by mouth daily.  , Disp: , Rfl: ;  Omega-3 Fatty Acids (FISH  OIL) 1000 MG CAPS, Take by mouth 2 (two) times daily.  , Disp: , Rfl: ;  potassium chloride SA (K-DUR,KLOR-CON) 20 MEQ tablet, Take 20 mEq by mouth daily.  , Disp: , Rfl: ;  DISCONTD: iron polysaccharides (NIFEREX) 150 MG capsule, Take 150 mg by mouth daily.  , Disp: , Rfl:  DISCONTD: meloxicam (MOBIC) 7.5 MG tablet, Take 7.5 mg by mouth 2 (two) times daily.  , Disp: , Rfl: ;  DISCONTD: metoprolol succinate (TOPROL-XL) 25 MG 24 hr tablet, Take 25 mg by mouth daily.  , Disp: , Rfl: ;  DISCONTD: propoxyphene-acetaminophen (DARVOCET-N 100) 100-650 MG per tablet, Take 1 tablet by mouth as needed.  , Disp: , Rfl:    Past Medical History  Diagnosis Date  . Coronary atherosclerosis of native coronary artery     Multivessel  . Essential hypertension, benign   . Mixed hyperlipidemia   . Hypothyroidism   . Arthritis   . Aortic stenosis   . Hepatitis     Nonviral  . Atrial fibrillation     Postoperative, transiently on Coumadin    Social History [name] reports that she quit smoking about 21 years ago. She has never used smokeless tobacco. [name] reports that she does not drink alcohol.  Review of Systems No fevers, chills, or unusual weight change. No chest pain, progressive  shortness of breath, cough, hemoptysis, or wheezing. No palpitations, dizziness, or syncope. No dysphasia or odynophagia. Stable appetite with no abdominal pain, melena, or hematochezia. No orthopnea, PND, or lower extremity edema. No focal motor weakness, memory problems, or speech deficits. Otherwise systems reviewed and negative except as already outlined.   Physical Examination Filed Vitals:   09/26/10 1450  BP: 123/69  Pulse: 65  Morbidly obese woman in no acute distress. HEENT: Conjunctiva and lids normal, oropharynx with moist mucosa. Neck: Supple, no elevated venous pressure bruits. Lungs: Diminished but clear. Cardiac: Regular rate and rhythm with 2/6 systolic murmur, no S3 gallop. Abdomen: Obese,  nontender. Extremity: Trace ankle edema, nonpitting.   ECG Normal sinus rhythm.   Problem List and Plan

## 2010-09-26 NOTE — Patient Instructions (Signed)
Your physician wants you to follow-up in: 6 months. You will receive a reminder letter in the mail one-two months in advance. If you don't receive a letter, please call our office to schedule the follow-up appointment. Your physician recommends that you continue on your current medications as directed. Please refer to the Current Medication list given to you today. 

## 2010-10-08 LAB — URINALYSIS, ROUTINE W REFLEX MICROSCOPIC
Glucose, UA: NEGATIVE mg/dL
Hgb urine dipstick: NEGATIVE
Specific Gravity, Urine: 1.023 (ref 1.005–1.030)
pH: 6 (ref 5.0–8.0)

## 2010-10-08 LAB — PROTIME-INR
INR: 1 (ref 0.00–1.49)
Prothrombin Time: 13 seconds (ref 11.6–15.2)

## 2010-10-08 LAB — APTT: aPTT: 26 seconds (ref 24–37)

## 2010-10-08 LAB — BASIC METABOLIC PANEL
BUN: 18 mg/dL (ref 6–23)
CO2: 30 mEq/L (ref 19–32)
Chloride: 107 mEq/L (ref 96–112)
Potassium: 4.7 mEq/L (ref 3.5–5.1)

## 2010-10-08 LAB — WOUND CULTURE: Gram Stain: NONE SEEN

## 2010-10-08 LAB — CBC
HCT: 39.2 % (ref 36.0–46.0)
MCHC: 33.6 g/dL (ref 30.0–36.0)
MCV: 90.6 fL (ref 78.0–100.0)
Platelets: 238 10*3/uL (ref 150–400)
WBC: 7.5 10*3/uL (ref 4.0–10.5)

## 2010-11-06 NOTE — Letter (Signed)
August 15, 2008   Appeals Analyst-Level I  Network Support department  [facility]  [id number]  [place], [id number]   Re:  [name], [name]             DOB:  03-07-1941   Letter regarding service, negative pressure wound therapy, electrical  pump.   To Whom It May Concern:   Please be advised that the patient is a patient of ours, who has had a  complicated course with respect to a small wound in her right thigh.  This wound has been chronic and nonhealing, originally dating back to  her open heart surgery performed in June 2009.  Ultimately it became  clear that excisional debridement of the wound would be necessary for  excision of a chronic sinus tract.  This procedure was performed on  July 13, 2008, with intention of opening the wound and placing a  negative pressure vacuum-assisted closure device at the time.  Unfortunately, the normal procedure for preoperative approval for  insurance coverage for negative pressure wound therapy did not happen  prior to surgery.  This was an administrative failure on our end, and  this problem should not be passed on to the patient.  Please be advised  that our intention was always to initiate negative pressure wound  therapy beginning at the time of surgery, and this therapy remains in  place to this date.  Again, failure to obtain preoperative approval for  initiation of this therapy did not happen, and the reason for this was  an administrative failure on behalf of the [employer], not the patient.  We would hope that [facility] will not  continue to hold the patient accountable for this administrative  oversight, and rapidly improve insurance coverage for this very  important and necessary therapy.   Please do not hesitate to contact me at any point for further  clarification of the patient's underlying medical problems and the need  for this type of therapy.   Sincerely,   Salvatore Decent. Cornelius Moras, M.D.  Electronically Signed   CHO/MEDQ  D:  08/15/2008  T:  08/16/2008  Job:  [id number]

## 2010-11-06 NOTE — Assessment & Plan Note (Signed)
[facility]                          [facility]   NAME:Laws, NIKO JAKEL                          MRN:          [id number]  DATE:09/24/2007                            DOB:          11/12/1940    PRIMARY CARE PHYSICIAN:  Doreen Beam, MD   REASON FOR VISIT:  Follow up aortic stenosis.   HISTORY OF PRESENT ILLNESS:  Ms. Jumper returns for a routine visit.  She  is reporting somewhat progressive dyspnea on exertion, NYHA class II to  III, at times when she pushes it.  She is not having any chest pain,  palpitations or syncope, however.  I referred her for follow-up  echocardiogram that was done back in late January and does show some  progression in her aortic stenosis, now more clearly into the severe  range.  Her peak and mean transvalvular velocities 76 mmHg and 47 mmHg,  respectively.  Calculated valve area was 0.97.  We have already  discussed the likelihood that she would need to proceed with valve  surgery over this next year, and she has been considering this.  She  states that she has a daughter that lives with her, really her only  family support at this time, and that she would prefer to wait until  early summer, because her daughter is a Runner, broadcasting/film/video and would be out of work  at that time to help take care of her.  She will need help in the  postoperative period, and I think this is very reasonable.  She does  have arthritis that affects her knees, and this may also to some degree  slow her recovery.  We discussed a return to the office in May to early  June for scheduling of a cardiac catheterization and the subsequent  referral to cardiothoracic surgery.  I have otherwise cautioned her to  avoid strenuous activity and let me know if symptoms progress more  suddenly.   ALLERGIES:  PENICILLIN.   PRESENT MEDICATIONS:  1. Levoxyl 88 mcg p.o. q.d.  2. Metoprolol 25 mg p.o. b.i.d.  3. Lisinopril HCT 20/12.5 mg p.o.q.d.  4. Meloxicam 7.5  mg p.o.q.d.  5. Zetia 10 mg p.o.q.d.  6. Folic acid, 1 mg daily.  7. Soriatane 25 mg p.o. every other day.  8. Methotrexate 2.5 mg three times a week.  9. Centrum Silver daily.  10.Glucosamine chondroitin.  11.Calcium supplements.  12.Aspirin 81 mg p.o.q.d.  13.Red yeast rice extract.  14.Tricor 145 mg p.o. q.a.m.   REVIEW OF SYSTEMS:  As described in history of present illness,  otherwise negative.   EXAMINATION:  VITAL SIGNS:  Blood pressure is 142/64, rate is 61, weight  is 253 pounds.  GENERAL:  This is an overweight woman in no acute distress.  HEENT:  Conjunctiva and lids normal.  Pharynx is clear.  NECK:  Supple.  No elevated jugular venous pressure or loud bruits.  There is radiation of aortic stenosis murmur to the right.  LUNGS:  Are clear without labored breathing.  CARDIAC:  Exam reveals a 3/6 systolic murmur with decreased  S2, heard  mainly at the base, although radiating towards the apex.  There is no S3  gallop or pericardial rub.  ABDOMEN:  Obese, nontender.  No active bowel sounds.  EXTREMITIES:  Exhibit no frank pitting edema.  Distal pulses are 1 to  2+.  MUSCULOSKELETAL:  No kyphosis noted.  NEUROPSYCHIATRIC:  Patient alert and oriented x3.  Affect is normal.   IMPRESSION/RECOMMENDATIONS:  Severe aortic stenosis as outlined above,  with normal ejection fraction of 55% to 60% and moderate left  ventricular hypertrophy.  As outlined above, I will plan to bring Ms.  Winther back to the office in late May to early June, to schedule her for a  cardiac catheterization and then subsequent referral to cardiothoracic  surgery.  It is at that time a year that her daughter, who is a Runner, broadcasting/film/video,  will be out of work and be able to take care of her.  This is her only  family, at this time.  I have cautioned her to avoid strenuous activity  and certainly let me know if symptoms progress more quickly.  She  reports dyspnea on exertion but is having no chest pain, palpitations,   or syncope at this time.  Further plans to follow.     [name], MD  Electronically Signed    SGM/MedQ  DD: 09/24/2007  DT: 09/24/2007  Job #: [id number]

## 2010-11-06 NOTE — Op Note (Signed)
NAME:  [name], [name]                   ACCOUNT NO.:  [id number]   MEDICAL RECORD NO.:  [id number]          PATIENT TYPE:  AMB   LOCATION:  SDS                          FACILITY:  [facility]   PHYSICIAN:  Salvatore Decent. Cornelius Moras, M.D. DATE OF BIRTH:  1941-03-29   DATE OF PROCEDURE:  04/01/2008  DATE OF DISCHARGE:                               OPERATIVE REPORT   PREOPERATIVE DIAGNOSIS:  Right thigh wound infection.   POSTOPERATIVE DIAGNOSIS:  Right thigh wound infection.   PROCEDURE:  Excisional debridement of right thigh wound.   SURGEON:  Salvatore Decent. Cornelius Moras, MD   ANESTHESIA:  Lidocaine 1% local with monitored anesthesia care.   BRIEF CLINICAL NOTE:  The patient is a 70 year old morbidly obese female  who underwent coronary artery bypass grafting previously.  She has had  chronic wound infection involving the right thigh, endoscopic vein  harvest incision that has failed to heal with more conservative  measures.  The patient is now brought to the operating room for further  debridement of this wound.   OPERATIVE NOTE IN DETAIL:  Following full informed consent, the patient  was brought to the operating room on the above-mentioned date and placed  in the supine position on the operating table.  Intravenous antibiotics  were administered.  The patient's right thigh was prepared and draped in  sterile manner.  Intravenous sedation was administered by the anesthesia  team under the care and direction of Dr. Arta Bruce.  Lidocaine 1%  local anesthesia was administered to the skin and subcutaneous tissues  immediately surrounding the chronically draining wound located on the  medial aspect of the distal thigh just above the right knee.  A 15-blade  knife was used to make an elliptical incision to excise the chronic scar  tissue around this wound.  This continued deep and a deep pocket was  encountered consistent with a probable lymphocele.  Wound swab cultures  were obtained.  All the surrounding scar  tissue was debrided and  removed.  The wound was irrigated with saline solution and packed with  sterile gauze.  The patient tolerated the procedure well and was  transported back to the postanesthesia care unit in stable condition.      Salvatore Decent. Cornelius Moras, M.D.  Electronically Signed     CHO/MEDQ  D:  04/01/2008  T:  04/01/2008  Job:  [id number]

## 2010-11-06 NOTE — Assessment & Plan Note (Signed)
OFFICE VISIT   Guardia, [name]  DOB:  [birth year]                                        February 01, 2008  CHART #:  [id number]   The patient comes in today for her regularly scheduled followup with Dr.  Cornelius Moras.  She is status post AVR, CABG on December 10, 2007.  She has recently  been followed in the office with superficial separation of her EVH  incision.  It initially was infected, but this resolved and now the  wound is granulating.  She is down to doing dressing changes daily and  the wound is small, note that she is using 1/4-inch gauze at this point.  She has done well since her last office visit.  Her main complaints  today are related to her arthritis and some swelling and limitation of  her mobility related to her arthritis.  Her breathing has been stable.  Her Coumadin had been a little bit difficult to regulate over the past  week and the cardiologist's office has been making daily changes to her  medication.  She is otherwise doing fairly well.   PHYSICAL EXAMINATION:  VITAL SIGNS:  Blood pressure is 179/87, pulse is  80, respirations 18, O2 sat 92% on room air.  SKIN:  Her sternal wound has healed well.  Her right lower extremity EVH  site is granulating.  It is much smaller than it was on her previous  visit, although remains about a centimeter deep.  The area surrounding  the wound is free of erythema and there is no purulence at this time.  HEART:  Regular rate and rhythm.  LUNGS:  Clear.  EXTREMITIES:  She does have some pedal and lower extremity edema  bilaterally.   Chest x-ray today is improved with only trace pleural effusions.   ASSESSMENT/PLAN:  Dr. Cornelius Moras has seen the patient today and reviewed her x-  ray.  We will continue with daily dressing changes to the right lower  extremity EVH site.  She is otherwise progressing well.  She has a  followup appointment in 2 weeks with Dr. Diona Browner.  Dr. Orvan July opinion  today if she is still in sinus  rhythm at that time, then from our  standpoint she can discontinue her Coumadin.  She has previously taken  Mobic for her arthritis and has been off of it since starting Coumadin.  We will refer her arthritis medication questions back to her primary  care physician.  We will see her back in our office in 3 weeks for  another wound recheck.  She is released at this point to begin driving  short distances, but to continue with sternal precautions against heavy  lifting.   Salvatore Decent. Cornelius Moras, M.D.  Electronically Signed   GC/MEDQ  D:  02/01/2008  T:  02/02/2008  Job:  [id number]   cc:   [name], MD  Dr. Sherril Croon

## 2010-11-06 NOTE — Assessment & Plan Note (Signed)
[facility]                          [facility]   NAME:[name]                          MRN:          [id number]  DATE:02/16/2008                            DOB:          [birth year]    PRIMARY CARE PHYSICIAN:  Doreen Beam, MD   REASON FOR VISIT:  Scheduled followup.   HISTORY OF PRESENT ILLNESS:  [name] comes back in for a postoperative  visit.  She continues to recuperate fairly well.  Recently noted lower  extremity edema, has improved with an increase in Lasix and potassium  supplementation.  Labs done back on February 08, 2008, showed a BUN and  creatinine of 27 and 1.3, sodium of 141, and a potassium of 3.4.  Her  BNP was only 31 at that time.  She had a followup INR yesterday of 1.6.  Her main complaint is arthritic pain.  She was previously on  methotrexate and meloxicam, although has been on no medicines for her  arthritis.  This limits her ambulation.  Otherwise, she has good  strength in her left lower extremity and some mild residual numbness in  her left hand.  I reviewed her medications and also I spoke with Shelby Dubin in our [facility] regarding reasonable choices for arthritic  treatment considering the long-term use of Coumadin in this patient.  We  talked about Extra-Strength Tylenol, possibly a trial of Naprosyn if  this was not effective, and as a last resort to return to meloxicam.  She, otherwise, has had no bleeding problems or palpitations.  Her  electrocardiogram shows sinus rhythm at 74 beats per minute with left  atrial enlargement and nonspecific ST-T wave changes.   ALLERGIES:  PENICILLIN.   PRESENT MEDICATIONS:  1. Aspirin 325 mg p.o. daily.  2. Iron 150 mg p.o. daily.  3. Coumadin as directed by the [facility]madin Clinic through home health      nurse blood checks.  4. Omega 3 supplements 2000 mg p.o. q.a.m. and 2000 mg p.o. q.p.m.  5. Amiodarone 200 mg p.o. daily.  6. Lasix 60 mg p.o. daily.  7. K-Dur 40 mEq p.o. daily.  8. TriCor 140 mg p.o. daily.  9. Folic acid 1 mg p.o. daily.  10.Zetia 10 mg p.o. daily.  11.Levothyroxine 88 mcg p.o. daily.   REVIEW OF SYSTEMS:  As per history of present illness.  Otherwise,  negative.   PHYSICAL EXAMINATION:  VITAL SIGNS:  Blood pressure is 140/69, heart  rate is 76 and regular, and weight is 248 pounds.  GENERAL:  She is an overweight woman in no acute distress.  HEENT:  Conjunctiva is normal.  Oropharynx is clear.  NECK:  Supple.  No elevated jugular venous pressure.  No audible bruits.  No thyromegaly is noted.  LUNGS:  Clear without labored breathing at rest.  Chest wall is stable  with well-healing sternal incision.  CARDIAC:  Regular rate and rhythm, soft systolic murmur at the base  consistent with prosthetic aortic valve.  No diastolic murmur or  pericardial rub.  ABDOMEN:  Soft and nontender.  No obvious hepatomegaly.  EXTREMITIES:  Exhibit chronic-appearing edema.  Some venostasis, right  worse than left.  Distal pulses are 1+.  MUSCULOSKELETAL:  No kyphosis noted.  NEUROPSYCHIATRIC:  The patient is alert and oriented x3.  Affect is  appropriate   IMPRESSION AND RECOMMENDATIONS:  1. History of severe aortic stenosis with 2-vessel obstructive      coronary artery disease status post bioprosthetic aortic valve      replacement and 2-vessel coronary artery bypass grafting.  She is      doing well.  I have encouraged her to continue to increase her      activity as best as she can.  She has already been seen by Dr. Cornelius Moras      in followup.  We will see her back in the next few months.  2. History of postoperative atrial fibrillation, maintaining sinus      rhythm.  She will continue on Coumadin indefinitely with a history      of stroke and a CHADS2 score of 3.  I have asked her to discontinue      her amiodarone after her present bottle and we will start Toprol-XL      25 mg p.o. daily.  3. In terms of her arthritic  discomfort, I have asked her to start      using Extra-Strength Tylenol regularly.  If this is not effective,      then potentially a trial of Naprosyn with close attention to      Coumadin followup could be considered.  As a last resort, we might      consider resuming Mobic if neither of these two options work.     [name], MD  Electronically Signed    SGM/MedQ  DD: 02/16/2008  DT: 02/17/2008  Job #: [id number]   cc:   Doreen Beam, MD

## 2010-11-06 NOTE — Assessment & Plan Note (Signed)
[facility]                          [facility] NOTE   NAME:[name], [name]                 MRN:          [id number]  DATE:02/03/2007                            DOB:          06-Oct-1940    REASON FOR VISIT:  Followup aortic stenosis.   HISTORY OF PRESENT ILLNESS:  I saw [name] back in May of last year.  She has a history of moderate to severe aortic stenosis and we have been  following her with serial echocardiograms and symptom review.  I planned  on seeing her back 6 months following her last visit although this did  not get scheduled.  She comes in today saying that she has had no  changes in her baseline functional capacity.  She is limited by  arthritic pain from rheumatoid arthritis involving her knees and hands.  She is not reporting any exertional chest pain, progressive  breathlessness or syncope.  She had a followup echocardiogram in July  which demonstrated moderate left ventricular hypertrophy with an  ejection fraction of 60% - 65%.  She had moderate left atrial  enlargement with trace mitral regurgitation.  The aortic valve was  moderately calcified and trileaflet with decreased cuspid excursion and  a stable abnormal transvalvular mean gradient of 35 mmHg.  Peak velocity  was around 4.1 meters per second.  I reviewed this with the patient  today.  We have already discussed the fact that she will need ultimately  an aortic valve replacement.  She has been taking her medicines as  directed.  Recent blood work shows normal liver function tests with an  LDL cholesterol of 96, HDL 67, triglycerides 97 and total cholesterol of  182.  Her electrocardiogram today shows sinus rhythm with voltage  criteria for left ventricular hypertrophy and nonspecific T wave  changes.   ALLERGIES:  PENICILLIN.   PRESENT MEDICATIONS:  1. Levoxyl 88 mcg p.o. daily.  2. Metoprolol 25 mg p.o. b.i.d.  3. Lisinopril/HCTZ 20/12.5 mg p.o.  daily.  4. Meloxicam 2.5 mg p.o. daily.  5. Zetia 10 mg p.o. daily.  6. Folic acid 1 mg p.o. daily.  7. Soriatane 25 mg p.o. every other day.  8. Methotrexate 2.5 mg 3 times a week.  9. Centrum Silver.  10.Glucosamine chondroitin.  11.Omega 3 supplements.  12.Calcium supplements.  13.Aspirin 81 mg p.o. daily.  14.Red yeast rice extract.   REVIEW OF SYSTEMS:  As described in the history of present illness.  Otherwise negative.   EXAMINATION:  Blood pressure today 164/69, heart rate is 69, weight is  257 pounds.  This is an obese woman in no acute distress.  HEENT:  Normal.  NECK:  Supple, no elevated jugular venous pressure or loud bruits, no  thyromegaly.  LUNGS:  Clear, diminished breath sounds, no active wheezing.  CARDIAC:  Reveals a 3/6 systolic murmur heard at the base consistent  with aortic stenosis.  The second heart sound is reduced but not absent,  no S3 gallop or pericardial rub.  ABDOMEN:  Obese, no obvious hepatomegaly, no bruits.  EXTREMITIES:  Exhibit trace edema.  MUSCULOSKELETAL:  No  kyphosis is noted.  NEUROPSYCHIATRIC:  Patient alert and oriented x3, affect is normal.   IMPRESSION/RECOMMENDATION:  1. Moderately severe aortic stenosis with stable gradients by recent      echocardiography.  She is symptomatically stable and not at a point      where she is wanting to consider any valve replacement surgeries.      She will have perioperative risks associated with her obesity and      rheumatoid arthritis requiring methotrexate.  I suspect this will      impact her both from the perspective of postoperative      rehabilitation and also potentially perioperative infection risk.      We have talked about this and our plan is to defer any potential      surgery consultations at this point in favor of a symptom review      over the next 6 months with a repeat echocardiogram.  We can make      determinations about the referral for surgery at that time.  2. Further  plans to follow.     [name], MD  Electronically Signed    SGM/MedQ  DD: 02/03/2007  DT: 02/04/2007  Job #: [id number]

## 2010-11-06 NOTE — Assessment & Plan Note (Signed)
[facility]                         GASTROENTEROLOGY OFFICE NOTE   NAME:[name], [name]                 MRN:          [id number]  DATE:03/18/2007                            DOB:          June 24, 1941    REASON FOR CONSULTATION:  Possible drug-induced hepatitis.   HISTORY OF PRESENT ILLNESS:  [name] is a pleasant 70 year old white  female, referred through the courtesy of Dr. Ria Bush. T[place] for  evaluation.  She has psoriasis, for which she has received 1.7 grams of  methotrexate.  Liver tests have been checked periodically and have been  normal.  [name] has no GI complaints, including abdominal pain, change  in bowel habits, jaundice or hepatitis.  She rarely has had bleeding  from hemorrhoids.  She underwent a colonoscopy in 2006, at [facility].   PAST MEDICAL HISTORY:  1. Hypertension.  2. Thyroid disease.  3. She has a known heart murmur.   FAMILY HISTORY:  Noncontributory.   MEDICATIONS:  1. Levoxyl.  2. Metoprolol.  3. Lisinopril/hydrochlorothiazide.  4. Meloxicam.  5. Zetia.  6. Folate.  7. Soriatane.  8. Methotrexate.   ALLERGIES:  PENICILLIN.   SOCIAL HISTORY:  She neither smokes or drinks.  She is a retired Runner, broadcasting/film/video  and a widow.   REVIEW OF SYSTEMS:  Positive for joint pain and skin rash.   PHYSICAL EXAMINATION:  VITAL SIGNS:  Pulse 64, weight 262 pounds.  HEENT:  EOMI.  PERRLA.  Sclerae are anicteric.  Conjunctivae are pink.  NECK:  Supple without thyromegaly, adenopathy or carotid bruits.  CHEST:  Clear to auscultation and percussion without adventitious  sounds.  CARDIAC:  She has a 2/6 early systolic murmur heard throughout the  precordium.  Regular rhythm; normal S1 S2.  There are no gallops or  rubs.  ABDOMEN:  Bowel sounds are normoactive.  Abdomen is soft, nontender and  nondistended.  There are no abdominal masses, tenderness, splenic  enlargement or hepatomegaly.  EXTREMITIES:  She has psoriatic  lesions on the digits of her upper  extremities.  Full range of motion.  No cyanosis, clubbing or edema.  RECTAL:  Deferred.   IMPRESSION:  1. Psoriasis, on methotrexate.  Methotrexate-induced liver injury      needs to be ruled out.  2. Aortic stenosis.   RECOMMENDATION:  Percutaneous liver biopsy.     Barbette Hair. Arlyce Dice, MD,FACG  Electronically Signed    RDK/MedQ  DD: 03/18/2007  DT: 03/18/2007  Job #: [id number]   cc:   Ria Bush. Jorja Loa, M.D.  Doreen Beam  Patient

## 2010-11-06 NOTE — Assessment & Plan Note (Signed)
OFFICE VISIT   Fritze, [name]  DOB:  [birth year]                                        March 14, 2008  CHART #:  [id number]   HISTORY OF PRESENT ILLNESS:  The patient returns for followup related to  her right thigh endoscopic vein harvest wound infection.  She was last  seen here in the office February 22, 2008.  Since then, she has continued  with local wound care, packing her small wound daily with iodoform  gauze.  Home health nurses continue to check on her once a week and  otherwise her daughter is helping her with wound care.  She has not had  any further problems.  She denies any fevers or chills.  She has not had  any significant pain.  She reports no purulent drainage but only dark,  bloody drainage during dressing changes.  She has completed her course  of antibiotics.   PHYSICAL EXAMINATION:  GENERAL:  A morbidly obese female.  VITAL SIGNS:  Blood pressure 180/77, pulse 66, oxygen saturation 92%.  She is afebrile.  EXTREMITIES:  Right thigh and lower leg demonstrates that all the  surgical incisions have healed with exception of the small punctate open  wound that was there previously.  There is no surrounding cellulitis.  There is no fluctuance on palpation and there is no tenderness.  On  probing wound, this still will probe more than 1 cm in depth.  Dark,  bloody fluid was evacuated with probing consistent with probable  resolving old hematoma.  There is a mild residual diffuse rash involving  both lower legs consistent with longstanding venous insufficiency.  No  other abnormalities are noted.   IMPRESSION:  Small open wound involving right thigh endoscopic vein  harvest incision with probable resolving hematoma.  At this point, there  is no sign of active infection, but I am skeptical that this will heal.  I have discussed options with the patient here in the office today  including continued wound care like we have been doing versus  taking her  back for exploration of the wound with irrigation and debridement of any  foreign material and hematoma and possible wound VAC placement.  She is  reluctant to consider going down this path at this time.   PLAN:  We will continue local wound care as previously prescribed.  The  patient has been cautioned that if this deteriorates any further, we  will need to intervene surgically.  All of her questions have been  addressed.  We will see her back in 4 weeks.   Salvatore Decent. Cornelius Moras, M.D.  Electronically Signed   CHO/MEDQ  D:  03/14/2008  T:  03/15/2008  Job:  [id number]   cc:   [name], MD  Doreen Beam, MD

## 2010-11-06 NOTE — Assessment & Plan Note (Signed)
[facility]                          [facility] OFFICE NOTE   NAME:[name], [name]                          MRN:          [id number]  DATE:01/05/2008                            DOB:          1941/05/29    PRIMARY CARE PHYSICIAN:  April Beam, MD   CARDIOTHORACIC SURGEON:  April Decent. Cornelius Moras, MD   REASON FOR VISIT:  Routine followup.   HISTORY OF PRESENT ILLNESS:  [name] presents to the office in followup  of aortic valve replacement due to severe, progressive aortic stenosis  and subsequent findings of 2-vessel obstructive coronary artery disease  She underwent aortic valve replacement with a 21-mm Edwards Magna  pericardial tissue valve and bypass grafting with a LIMA to the left  anterior descending and saphenous vein graft to distal right coronary  artery.  She was discharged from the hospital in late June, and has home  health nursing as well as physical therapy.  Her hospital stay is  notable for evidence of a postoperative incisional cellulitis involving  the right vein harvest site as well as atrial fibrillation with evidence  of a right-sided cerebrovascular event, age indeterminate, although  treated with Coumadin.  Overall, she is doing fairly well.  She is using  a rolling walker essentially for stability and to be safe, although she  has not been using it pretty much at home.  She still has some numbness  in her left arm and hand with some weakness in her shoulder.  She is not  reporting any chest pain or breathlessness at rest.  She has no sense of  palpitations.  She has been taking her medicines as directed and is in  sinus rhythm today on examination.  She denies having any erythema or  drainage from either her sternal incision or her vein harvest site and  completed a course of Keflex.  She is due to see [name] on February 01, 2008, with a chest x-ray at that time.   ALLERGIES:  PENICILLIN.   PRESENT MEDICATIONS:  1. Aspirin  325 mg p.o. daily.  2. Nu-Iron 150 mg p.o. daily.  3. Coumadin as directed by the [facility] (presently the      [facility]).  4. Lasix 40 mg p.o. daily.  5. K-Dur 20 mEq p.o. daily.  6. Omega-3 supplements 4 g daily.  7. Amiodarone 200 mg p.o. b.i.d.  8. TriCor 145 mg p.o. daily.  9. Levoxyl 88 mcg p.o. daily.  10.Zetia 10 mg p.o. daily.   REVIEW OF SYSTEMS:  As outlined above, otherwise negative.   PHYSICAL EXAMINATION:  VITAL SIGNS:  Blood pressure is 149/77, heart  rate is 77 and regular, and weight is 249.2 pounds.  GENERAL:  The patient is overweight in no acute distress.  HEENT:  Conjunctiva is normal.  Oropharynx clear.  NECK:  Supple.  No elevated jugular venous pressure.  No loud bruits.  No thyromegaly is noted.  LUNGS:  Clear.  Diminished breath sounds throughout.  No rales or  labored breathing.  CARDIAC:  Regular rate and rhythm,  soft basal systolic murmur.  Second  heart sound is preserved.  No S3, gallop, or pericardial rub.  ABDOMEN:  Obese, nontender.  THORAX:  A well-healing sternal incision with no erythema or drainage.  EXTREMITIES:  Exhibit chronic-appearing edema, right greater than the  left, 1+ to 2+ pitting on the right.  Vein harvest sites in the right  upper thigh show no erythema, appear to be healing.  There is an area  midway within this incision that is healing by second intention and has  been packed with gauze.  This is being followed by home health nursing.  Distal pulses are 1+.  SKIN:  Warm and dry.  Fine maculopapular eruption in the lower legs,  nonpruritic.  MUSCULOSKELETAL:  No kyphosis noted.  NEUROPSYCHIATRIC:  The patient is alert and oriented x3.  Affect is  appropriate.   IMPRESSION AND RECOMMENDATIONS:  1. History of severe aortic stenosis associated with 2-vessel      obstructive coronary artery disease, now status post coronary      artery bypass grafting and aortic valve replacement as outlined       above.  The patient is early in her postoperative course and is      recuperating relatively well.  She continues to require home health      nursing for followup and also physical therapy, which is being      extended a bit longer.  I plan to have Coumadin follow up      transitioned to here in the [employer] office.  We will plan to followup      CBC and BMET, and I have asked [name] to continue both her Lasix      and potassium at this time.  She is due to see [name] back on      February 01, 2008, and will have a chest x-ray at that point.  We      will plan to see her back over the next month.  2. Postoperative atrial fibrillation.  We will plan to continue      Coumadin.  I have asked [name] to decrease amiodarone to 200 mg      once a day.  I anticipate she will continue Coumadin indefinitely      given her evidence of cerebrovascular event and hypertension with      CHADS2 score of 3.  3. History of renal insufficiency at baseline, creatinine 1.3 at      discharge.  She will continue Lasix and potassium at present dosing      and a followup BMET will be obtained.  4. Fine maculopapular eruption of the lower extremities, rather      nonspecific appearing.  It is not clear that this is related to any      specific drug, although she was recently on Keflex (perhaps cross      reactivity with PENICILLIN allergy), and may also be related to      edema and skin irritation.  No obvious associated cellulitis at      this time.     April Sidle, MD  Electronically Signed    SGM/MedQ  DD: 01/05/2008  DT: 01/06/2008  Job #: [id number]   cc:   [name], M.D.  April Beam, MD

## 2010-11-06 NOTE — Assessment & Plan Note (Signed)
OFFICE VISIT   [name], [name]  DOB:  May 09, 1941                                        April 25, 2008  CHART #:  [id number]   HISTORY OF PRESENT ILLNESS:  The patient returns to the office today for  wound check.  She was supposed to see a PA, but apparently they are not  available.  She has done quite well.  Home health nurses have been  changing her wound VAC.  She has developed within the last day or two  sudden onset of red, swollen, and painful first metatarsophalangeal  joint in her right foot.  She has never been told that she had gout  before.  She has not had any fevers or chills.  She is otherwise getting  along quite well.  The remainder of her review of systems is  unremarkable.   PHYSICAL EXAMINATION:  GENERAL:  Notable for a well-appearing obese  female.  VITAL SIGNS:  Blood pressure 152/72.  She is afebrile.  Oxygen  saturation 95% on room air.  EXTREMITIES:  Her wound VAC is removed.  The small incision just above  the right knee is granulating in nicely.  The wound is very clean.  There is no purulent drainage.  There is no surrounding cellulitis.  The  wound has started to fill in, but there is still plenty of room to fit a  small VAC dressing in place.  The right foot is notably swollen with  obvious erythema and swelling around the first metatarsophalangeal  joint.   IMPRESSION:  1. Probable acute gout flare.  2. Progressive healing of the right thigh endoscopic vein harvest      wound infection and lymphocele that is now healing nicely with      wound vacuum-assisted closure (dressing) in place.   PLAN:  I have given the patient a prescription for colchicine 0.6 mg  daily to begin today.  She has been instructed to take it for 2 weeks  with a maximum of 4 weeks.  If her symptoms do not improve, she has been  instructed to see her primary care physician.  We will plan to see her  back in 6 weeks for another wound check.  At some  point between now and  then it may get to the point where a wound VAC will no longer be  feasible as the wound continues to shrink at which  time, we will switch back to wet-to-dry gauze dressings as needed.  All  of her questions have been addressed.   [name], M.D.  Electronically Signed   CHO/MEDQ  D:  04/25/2008  T:  04/25/2008  Job:  [id number]   cc:   April Beam, MD  April Sidle, MD

## 2010-11-06 NOTE — Op Note (Signed)
NAME:  [name], April                   ACCOUNT NO.:  [id number]   MEDICAL RECORD NO.:  [id number]           PATIENT TYPE:   LOCATION:                                 FACILITY:   PHYSICIAN:  Salvatore Decent. Cornelius Moras, M.D. DATE OF BIRTH:  [birth year]   DATE OF PROCEDURE:  07/13/2008  DATE OF DISCHARGE:                               OPERATIVE REPORT   PREOPERATIVE DIAGNOSIS:  Right thigh wound.   POSTOPERATIVE DIAGNOSIS:  Right thigh wound.   PROCEDURE:  Excisional debridement of right thigh wound with excision of  chronic sinus tract and placement of wound VAC.   SURGEON:  Salvatore Decent. Cornelius Moras, MD   ANESTHESIA:  General.   BRIEF CLINICAL NOTE:  The patient is a 70 year old obese female who  underwent aortic valve replacement and coronary artery bypass grafting  x2 on December 10, 2007.  The patient developed right thigh wound infection,  which has undergone previous excisional wound debridement in the past.  However, the patient's wound has failed to heal and she has developed  what appears to be a chronic sinus tract.  Limited CT scan reveals no  sign of any retained foreign body.  The patient is now brought to the  operating room for further wound debridement.  The patient is provided  full informed consent and understands and accepts all potential  associated risks.   OPERATIVE NOTE IN DETAIL:  The patient was brought to the operating room  on the above-mentioned date and placed in the supine position on the  operating table.  Intravenous antibiotics were administered.  A time-out  procedure was performed verifying correct site, correct patient, and the  correct procedure.  The patient's existing wound packing was removed and  the patient's right thigh was prepared and draped in sterile manner.  Sterile culture swab was inserted through the chronic sinus tract and  the wound explored with swab.  The swab was sent for routine culture and  sensitivity.  There was no purulent drainage noted.  The  chronic sinus  tract appears to tunnel directly deep without tunneling in either  direction.  A 10-blade knife was then utilized to create an elliptical  incision to completely excise the sinus tract and all surrounding  chronic scar tissue from this chronic wound.  The sinus tract extends  quite deep nearly to the level of the fascia above the underlying  musculature.  All chronic sinus tissue and scar tissue was excised  sharply.  No purulence was encountered.  There was no sign of any  retained foreign body.  The wound was curetted further and further  debrided to remove any chronic scar tissue and nonviable tissue.  Meticulous hemostasis was ascertained.  The wound was irrigated with  saline solution.  A wound VAC was now placed after trimming a small  sponge to the appropriate size.  The wound VAC was connected to 125 mmHg  continuous suction and appropriate seal was verified.  The patient  tolerated the procedure well and was transported back to the post  anesthesia care unit in stable condition.  There were no intraoperative  complications.  Estimated blood loss was trivial.      Salvatore Decent. Cornelius Moras, M.D.  Electronically Signed     CHO/MEDQ  D:  07/13/2008  T:  07/13/2008  Job:  [id number]

## 2010-11-06 NOTE — Discharge Summary (Signed)
NAME:  [name] [name]                   ACCOUNT NO.:  [id number]   MEDICAL RECORD NO.:  [id number]          PATIENT TYPE:  OIB   LOCATION:  1962                         FACILITY:  [facility]   PHYSICIAN:  Salvatore Decent. Cornelius Moras, M.D. DATE OF BIRTH:  May 08, 1941   DATE OF ADMISSION:  12/08/2007  DATE OF DISCHARGE:  12/08/2007                               DISCHARGE SUMMARY   PRIMARY ADMITTING DIAGNOSES:  1. Severe aortic stenosis.  2. Two-vessel coronary artery disease.   ADDITIONAL/DISCHARGE DIAGNOSES:  1. Severe aortic stenosis.  2. Two-vessel coronary artery disease.  3. Right-sided cerebrovascular accident, age indeterminate.  4. Hypertension.  5. Hypothyroidism.  6. Morbid obesity.  7. Longstanding arthritis.  8. Postoperative atrial fibrillation.  9. Acute blood loss anemia.  10.Mild postoperative renal insufficiency.  11.Postoperative incisional cellulitis.   PROCEDURES PERFORMED:  1. Aortic valve replacement with 21-mm Edwards Magna pericardial      tissue valve.  2. Coronary artery bypass grafting x2 (left internal mammary artery to      the distal left anterior descending, saphenous vein graft to the      distal right coronary artery).  3. Endoscopic vein harvest right thigh.   HISTORY:  The patient is a 70 year old female with a known history of  aortic stenosis and hypertension.  [name] [name] apparently has a history of  rheumatic fever as a child and has been followed recently by Dr.  Diona Browner.  In early [name] 2009, [name] [name] developed worsening symptoms of  exertional dyspnea.  An echocardiogram was performed at [facility] which showed progression of aortic stenosis to the severe  range.  There was evidence suggestive of mild pulmonary hypertension and  possibly mild mitral regurgitation.  It was recommended that [name] [name] undergo  elective left to right heart catheterization to further delineate [name] [name]  anatomy.  [name] [name] was brought into [facility] on the date of this admission  to proceed  with cardiac catheterization by Dr. Gala Romney.  Catheterization showed severe two-vessel coronary artery disease with  moderate pulmonary hypertension.  There were tandem 99 and 95% lesions  in the mid LAD after the takeoff of a large diagonal branch.  There was  70% stenosis of the mid right coronary artery with right dominant  coronary circulation.  Following catheterization, [name] [name] was subsequently  admitted for cardiac surgery evaluation.   HOSPITAL COURSE:  [name] [name] was admitted following [name] [name] catheterization on  December 08, 2007.  [name] [name] was seen in consultation by Dr. Tressie Stalker and  [name] [name] films were reviewed.  Dr. Cornelius Moras felt [name] [name] best course of action would  be to proceed with AVR and CABG at this time.  He explained the risks,  benefits, and alternatives of the surgery to the patient and [name] [name] agreed  to proceed.  [name] [name] preoperative workup included lower extremity ABIs which  were greater than 1.0 bilaterally and carotid Doppler studies which  showed no ICA stenosis.  [name] [name] was taken to the operating room on June 18  and underwent CABG x2 with AVR as described in detail above.  [name] [name]  tolerated the procedures well and  was transferred to the SICU in stable  condition.  [name] [name] was able to be extubated shortly after surgery.  [name] [name] was  hemodynamically stable and doing well on postop day #1.  At that time,  [name] [name] was able to be transferred to the step-down unit.  Initially  postoperatively, [name] [name] developed acute renal insufficiency with creatinine  bumping up to 3.5.  [name] [name] was also quite volume overloaded and was started  on gentle diuresis.  After conservative management, [name] [name] creatinine began  to trend downwards and at this point is back to baseline.  Also, [name] [name]  developed intermittent atrial fibrillation and was started on beta-  blocker therapy as well as amiodarone.  [name] [name] said mostly in sinus that  even after amiodarone was started, [name] [name] had no other brief episode of  atrial fibrillation and it was felt that at  that point [name] [name] should be  anticoagulated.  [name] [name] was started on low-dose Coumadin and [name] [name] dose has  been titrated upward with close monitoring of [name] [name] INR.  [name] [name] has had a  mild acute blood loss anemia which has not required transfusion.  During  [name] [name] postoperative course, [name] [name] developed left leg weakness as well as  some complaints of left hand numbness.  Because of these new onset  findings, a brain CT was performed to rule out possible CVA.  [name] [name] CT  scan did reveal small lacunar infarct in the right centrum semiovale  which was age indeterminate.  A neuro consult was obtained and because  the patient was not felt to be a TPA candidate due to the duration of  symptoms and the nominal deficit, it was recommended that [name] [name] undergo a  CT angiogram and this was performed on June 25, which showed a stable  right centrum semiovale hypodensity and bilateral cavernous ICA  atherosclerosis with 60% stenosis with respect to the distal vessel on  the right, otherwise normal without intracranial abnormalities.  As  recommended that [name] [name] continue aspirin and Coumadin which [name] [name] has had  already been started on for atrial fibrillation.  PT and OT consults  were obtained and the patient is progressing well with rehab modalities.  Overall, [name] [name] is doing well.  [name] [name] has been afebrile and all vital signs  are stable.  [name] [name] is maintaining normal sinus rhythm.  [name] [name] O2 sats have  been greater than 90% on room air.  Current labs show a PT of 15.7 with  an INR of 1.2.  The remainder of [name] [name] recent labs showed sodium 141,  potassium 4.0, BUN 20, creatinine 1.29, white count 10.4, and hemoglobin  10.9, hematocrit 32.5, and platelets 259.  [name] [name] incisions are all healing  well with the exception of some erythema at [name] [name] right lower extremity  incision.  [name] [name] has been started on Keflex for mild early lower extremity  incisional cellulitis.  [name] [name] is diuresing well on Lasix.  [name] [name] neuro  status has remained stable.  It was felt that  if [name] [name] continues to  progress that hopefully within the next 48 hours, [name] [name] will be ready for  discharge home.   DISCHARGE MEDICATIONS ARE AS FOLLOWS:  1. Enteric-coated aspirin 325 mg daily.  2. Nu-Iron 150 mg daily.  3. Amiodarone 200 mg b.i.d.  4. Lasix 40 mg daily x1 week.  5. K-Dur 20 mEq daily x1 week.  6. Levoxyl 88 mcg daily.  7. Tricor 145 mg nightly.  8. Zetia 10 mg nightly.  9. Folic acid 1 mg daily.  10.Centrum Silver daily.  11.Glucosamine  and chondroitin b.i.d.  12.Calcium 500 mg b.i.d.  13.Omega-4 two tablets b.i.d.  14.Aspirin 81 mg daily.  15.Coumadin.  Home dose will be determined by PT and INR drawn at the      date of discharge.   DISCHARGE INSTRUCTIONS:  [name] [name] was asked to refrain from driving, heavy  lifting, or strenuous activity.  [name] [name] may continue ambulating daily and  using [name] [name] Incentive spirometer.  [name] [name] may shower daily and clean [name] [name]  incisions with soap and water.  [name] [name] will continue a low-fat, low-sodium  diet.   DISCHARGE FOLLOW-UP:  [name] [name] will see Dr. Ival Bible PA back in the office  in 2 weeks.  [name] [name] will follow up with Dr. Cornelius Moras on August 10 with a chest  x-ray.  [name] [name] will have home health PT and OT for further rehab.  [name] [name] will  follow up as directed with neuro.  In the interim if [name] [name] experiences any  problems or has questions, [name] [name] is asked to contact our office  immediately.      Coral Ceo, P.A.      Salvatore Decent. Cornelius Moras, M.D.  Electronically Signed    GC/MEDQ  D:  12/18/2007  T:  12/19/2007  Job:  [id number]   cc:   Bevelyn Buckles. Bensimhon, MD  Doreen Beam, MD  [name], MD  Lesia Sago

## 2010-11-06 NOTE — Consult Note (Signed)
NAME:  AXELLE, SZWED                   ACCOUNT NO.:  [id number]   MEDICAL RECORD NO.:  [id number]          PATIENT TYPE:  INP   LOCATION:  2017                         FACILITY:  [facility]   PHYSICIAN:  Marlan Palau, M.D.  DATE OF BIRTH:  Aug 03, 1940   DATE OF CONSULTATION:  12/17/2007  DATE OF DISCHARGE:                                 CONSULTATION   HISTORY OF PRESENT ILLNESS:  [name] is a 70 year old right-handed  white female born 01-23-1941 with a history of aortic stenosis  requiring an elective aortic valve replacement which was done on December 10, 2007 with a tissue valve.  The patient tolerated the procedure very  well, but has noted since surgery that the left side of her body has  felt somewhat numb.  The patient was first walked 6 days ago and noted  that the left leg was heavy.  The patient feels like the left hand is  cold and numb.  The patient initially attributed the sensation of the  left hand to the actual surgery itself.  The patient has undergone a CT  scan of the brain that was done on December 16, 2007 showing evidence of a  right centrum semiovale lacunar type infarct of indeterminate age.  The  patient has had no prior comparison study.  Carotid Doppler studies done  prior to surgery were unremarkable.  Neurology is asked to see this  patient for further evaluation of the above event.  NIH stroke scale  score is 1.   Past medical history is significant for,  1. Right centrum semiovale stroke possibly subacute with left      hemisensory deficit.  2. Status post aortic valve replacement this admission.  3. Obesity.  4. Hypertension.  5. Thyroid goiter resection.  6. Dyslipidemia.  7. Aortic stenosis with rheumatic fever prior to surgery.  8. Degenerative arthritis.  9. History of psoriasis.   The patient has an allergy to PENICILLIN and is intolerant to STATIN  DRUGS due to myalgias.   Does not smoke or drink.   SOCIAL HISTORY:  This patient is widowed,  lives in the [place] area, has one child who is alive and well.  The patient is  retired.   FAMILY MEDICAL HISTORY:  Mother died with a blood clot.  Father died  after complications of an abdominal aortic aneurysm.  The patient had  one sister died with multiple sclerosis.   REVIEW OF SYSTEMS:  Notable for no recent fevers or chills.  The patient  denies headache.  Denies visual disturbance, speech problems, swallowing  problems.  Does note some neck and shoulder pain following surgery.  The  patient denies pain down the arm and leg.  The patient denies any  shortness of breath, has some soreness in the site of surgery, but no  other chest pains.  The patient denies any abdominal pain, nausea,  vomiting, troubles controlling the bowels or bladder, dizziness, or  blackout episodes.  The patient does have some numbness on the left  side.  The patient  denies dizziness.   Current medications at this time include,  1. Aspirin 325 mg daily.  2. Amiodarone 400 mg q.12 h., the patient had transient atrial      fibrillation during this hospitalization following surgery.  3. Dulcolax 10 mg daily.  4. Colace 200 mg daily.  5. Lovenox 30 mg subcu daily.  6. Zetia 10 mg daily.  7. TriCor 145 mg daily.  8. Folic acid 1 mg daily.  9. Lasix 40 mg daily.  10.Nu-Iron 150 mg daily.  11.Xopenex 0.63 mg inhaler q.6 h.  12.Synthroid 0.088 mg daily.  13.Metoprolol 12.5 mg b.i.d.  14.Protonix 40 mg daily.  15.Potassium 20 mEq daily.  16.Coumadin therapy.  17.Dulcolax suppository if needed.  18.Laxative of choice if needed.  19.Ultram if needed.  20.Ambien 5 mg at night if needed.   Again, the patient has an allergy to PENICILLIN and intolerant to  STATINS.   PHYSICAL EXAMINATION:  VITALS:  Blood pressure is currently 122/69,  heart rate 67, respiratory rate 18, and temperature afebrile.  GENERAL:  This patient is a moderately to markedly obese white female  who is alert and  cooperative at the time of examination.  HEENT:  Atraumatic.  Eyes, pupils equal, round, and reactive to light.  Discs are flat bilaterally.  Extraocular movements are full.  Visual  fields are full.  Speech is well enunciated.  NECK:  Supple.  No carotid bruits noted.  RESPIRATORY:  Clear.  CARDIOVASCULAR:  Reveals a regular rate and rhythm.  No obvious murmurs  or rubs noted.  EXTREMITIES:  Notable for 1+ edema at the ankles bilaterally.  NEUROLOGIC:  Cranial nerves as above.  Facial symmetry is present.  The  patient has decreased pinprick sensation on the left face compared to  the right.  Again, full extraocular movements and visual fields noted.  Motor testing reveals 5/5 strength to direct testing on all fours.  Good  symmetric motor tone is noted throughout.  Sensory testing reveals a  decreased pinprick sensation and vibratory sensation of the left arm and  left leg compared to the right.  No evidence of extinction is seen.  The  patient has good finger-nose-finger and heel-to-shin.  Gait was not  tested.  The patient has good symmetric reflexes, very definitely  upgoing toe noted on the left, equivocal on the right.  Again, no drift  is seen with the arms or legs.   NIH stroke scale score is 1, but with sensory deficit.   Laboratory values notable for white count of 10.4, hemoglobin of 10.9,  hematocrit of 32.5, MCV of 95.8, platelets of 259, INR of 1.1, sodium  141, potassium 4.0, chloride of 97, CO2 of 36, glucose of 107, BUN of  20, and creatinine 1.29.   CT of the head is as above.  Carotid Doppler studies are unremarkable.   IMPRESSION:  1. New onset of right brain subcortical stroke with right centrum      semiovale stroke by CT, possibly subacute.  2. Status post aortic valve replacement.  3. Hypertension.  4. Obesity.   This patient may have sustained a small subcortical right brain stroke  in the perioperative period following aortic valve replacement.  The   patient does have a right centrum semiovale infarct by CT that could be  subacute.  Carotid Doppler studies done prior to surgery were  unremarkable.  The patient is on aspirin and Coumadin therapy at this  point. Patient is not a TPA candidate due  to duration of symptoms and  minimal deficit.   PLAN:  1. We will obtain a CT angiogram of the head and neck.  2. Continue aspirin and Coumadin for now.  3. Physical and occupational therapy.  The patient may benefit      possibly from rehab.  We will follow the patient's clinical course      while in-house.      Marlan Palau, M.D.  Electronically Signed     CKW/MEDQ  D:  12/17/2007  T:  12/18/2007  Job:  [id number]   cc:   Salvatore Decent. Cornelius Moras, M.D.  [facility]

## 2010-11-06 NOTE — Assessment & Plan Note (Signed)
[facility]                          [facility] OFFICE NOTE   NAME:[name], April LAIR                          MRN:          [id number]  DATE:11/24/2007                            DOB:          Jun 12, 1941    PRIMARY CARE PHYSICIAN:  Doreen Beam, M.D.   REASON FOR VISIT:  Followup aortic stenosis.   HISTORY OF PRESENT ILLNESS:  I saw [name] back in April.  She has known  severe aortic stenosis with peak and mean transvalvular gradients of 76  and 47 mmHg, respectively.  This was followed most recently with an  echocardiogram done in January.  She has a NYHA class II to III dyspnea  on exertion that has been progressive over the last several months,  although is not having any frank angina or syncope.  We have already  discussed need for a valve replacement over time, and when I saw her  back in April, she wanted to try to tentatively schedule this for  sometime in June.  She has a daughter who is really her only family  nearby who is a Runner, broadcasting/film/video and will be completing her school year around  this time.  She comes back into the office today with similar  symptomatology, and we discussed proceeding on to an outpatient cardiac  catheterization and then referral on to cardiothoracic surgery to  discuss valve replacement.   ALLERGIES:  PENICILLIN.   PRESENT MEDICATIONS:  1. Meloxicam 7.5 mg p.o. b.i.d.  2. TriCor 145 mg p.o. daily.  3. Red yeast rice extract.  4. Aspirin 81 mg p.o. daily.  5. Omega-3 supplements.  6. Calcium supplements.  7. Centrum Silver daily.  8. Methotrexate 2.5 mg twice a week.  9. Glucosamine chondroitin daily.  10.Folic acid 1 mg daily.  11.Zetia 10 mg p.o. daily.  12.Lisinopril/hydrochlorothiazide 20/12.5 mg p.o. daily.  13.Metoprolol 25 mg p.o. b.i.d.  14.Levoxyl 88 mcg p.o. daily.   REVIEW OF SYSTEMS:  As outlined above.  Otherwise, negative.   PHYSICAL EXAMINATION:  VITAL SIGNS:  Blood pressure 107/59, heart rate  is  77, saturation is 94% on room air.  GENERAL:  The patient is comfortable and in no acute distress.  She is  overweight.  HEENT:  Conjunctiva and lids normal.  Oropharynx clear.  NECK:  Supple.  No elevated jugular venous pressure.  No loud bruits, no  thyromegaly.  LUNGS:  Clear without labored breathing at rest.  CARDIAC:  Regular rate and rhythm with a 3/6 systolic murmur heard at  the base.  Second heart sound is diminished but audible.  This radiates  towards the apex.  There is no S3 gallop or pericardial rub.  ABDOMEN:  Obese, nontender, normoactive bowel sounds.  EXTREMITIES:  No frank pitting edema.  Distal pulses are 1-2+.  SKIN:  Warm and dry.  MUSCULOSKELETAL:  No kyphosis noted.  NEUROPSYCHIATRIC:  The patient is alert and oriented x3.  Affect is  appropriate.   IMPRESSION AND RECOMMENDATIONS:  1. Severe aortic stenosis, as outlined previously.  The valve is      described  as trileaflet by her most recent echocardiogram with      moderate calcification and severely limited cusp excursion.  Her      mean transvalvular gradient was 47 mmHg.  She has in [place] [employer]      Association Class II to III dyspnea on exertion which has been      progressive, particularly over the last 6 months.  Our plan at this      point is to refer her for a diagnostic cardiac catheterization to      clearly define the coronary anatomy in anticipation of referral to      cardiothoracic surgery to discuss valve replacement.  She is in      agreement with this.  Baseline labs will be obtained as well as a      chest x-ray.  2. Known history of arthritis as well as psoriasis.  She is on the      medications outlined above.  This may well to some degree inhibit      her rehabilitation process, although she does ambulate at this time      without assistance.  3. History of hypertension, well-controlled.  4. History of hyperlipidemia, recent laboratory data showed a low-      density lipoprotein  cholesterol of 94 in April.     [name], MD  Electronically Signed    SGM/MedQ  DD: 11/24/2007  DT: 11/24/2007  Job #: [id number]   cc:   Doreen Beam, MD

## 2010-11-06 NOTE — Assessment & Plan Note (Signed)
OFFICE VISIT   [name], [name]  DOB:  [birth year]                                        April 05, 2008  CHART #:  [id number]   HISTORY OF PRESENT ILLNESS:  The patient returns to the office today for  wound check and VAC placement.  She underwent excisional debridement and  drainage of right thigh endoscopic vein harvest wound infection on  April 01, 2008.  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  Her wound was debrided  extensively and packed.  She has done well since then with wound packing  and returns to the office today for further management.  She has not had  any fevers.  She reports that her wound has been somewhat tender.  She  has no other complaints.   PHYSICAL EXAMINATION:  GENERAL:  Notable for well-appearing obese female  who is afebrile.  Her wound packing is removed.  There is no surrounding  cellulitis.  There is no purulence.  The wound is well drained and  clean.  A wound VAC is now placed at the ACL and continuous suction  applied.   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   PLAN:  We will continue wound VAC with changes per home health nursing 3  times a week.  The patient will complete her course of oral Keflex as  previously prescribed.  She will return to the office for further  followup and wound check in 3 weeks.   Salvatore Decent. Cornelius Moras, M.D.  Electronically Signed   CHO/MEDQ  D:  04/05/2008  T:  04/06/2008  Job:  [id number]

## 2010-11-06 NOTE — Assessment & Plan Note (Signed)
OFFICE VISIT   [name], [name]  DOB:  Jan 01, 1941                                        June 06, 2008  CHART #:  [id number]   The patient is status post aortic valve replacement with pericardial  tissue valve and coronary artery bypass grafting x2 done by Dr. Cornelius Moras on  December 10, 2007.  She developed a right thigh wound infection requiring  excisional debridement of right thigh wound on April 01, 2008, with  wound VAC placement.  She was last seen in the office on April 25, 2008.  At this time, the patient's wound was noted to be healing well.  She was still using the wound VAC at this time.  Dr. Cornelius Moras switched her  to wet-to-dry dressing changes daily.  Also at this time, Dr. Cornelius Moras gave  her a prescription for colchicine for gout flare-up.  The patient  presents today for wound followup.  The patient is without any  complaints.  She states that home health nurse is continuing to come up  and change her packing daily.  She states that when home health nurse  does not visit, her daughter is changing her dressing.  She denies any  redness, swelling, pain, or purulent drainage.  She also states that her  gout flare-up 6 weeks ago has resolved.  She does not remember any  specific appointment she has per Dr. Diona Browner, this questioning length  of Coumadin.   PHYSICAL EXAMINATION:  VITAL SIGNS:  Blood pressure 157/74, pulse of 58,  respirations of 18, and O2 sats 94% on room air.  EXTREMITIES:  Iodoform gauze was removed from the patient's right knee  wound.  There is no purulent drainage, erythema, or edema noted at the  site.  There is a small amount of blood noted.  This wound was repacked  with iodoform gauze, but with normal saline.  A sterile gauze applied  afterwards.   IMPRESSION AND PLAN:  The patient continues to progress well.  We will  plan to continue daily packing changes to her right lower extremity  wound.  We will plan to follow back up with  her in 6 weeks for further  evaluation.  She is told to contact Dr. Ival Bible office to schedule an  appointment with him within the next month to 2 months.  In the interim,  she is told to contact us  if she has any erythema, edema, or purulent drainage noted from her  wound site.  The patient is in agreement.   Salvatore Decent. Cornelius Moras, M.D.  Electronically Signed   KMD/MEDQ  D:  06/06/2008  T:  06/06/2008  Job:  [id number]   cc:   Salvatore Decent. Cornelius Moras, M.D.  Doreen Beam, MD  [name], MD

## 2010-11-06 NOTE — Assessment & Plan Note (Signed)
OFFICE VISIT   Talwar, Maidie H  DOB:  1941/04/05                                        Nov 07, 2008  CHART #:  [id number]   The patient presents to the office today proudly displaying her right  lower thigh scar.  She reports that her legs finally healed up within  the last month, and she is delighted that she has had no further  problems or complaints.   On physical exam, her wound has healed completely.  There is no sign of  any residual infection whatsoever.  She is delighted and she will call  or return to see [place] in the future only should further problems or  difficulties arise.   Salvatore Decent. Cornelius Moras, M.D.  Electronically Signed   CHO/MEDQ  D:  11/07/2008  T:  11/08/2008  Job:  [id number]   cc:   [name], MD  Doreen Beam, MD

## 2010-11-06 NOTE — Op Note (Signed)
NAME:  [name], [name]                   ACCOUNT NO.:  [id number]   MEDICAL RECORD NO.:  [id number]           PATIENT TYPE:   LOCATION:                                 FACILITY:   PHYSICIAN:  Salvatore Decent. Cornelius Moras, M.D. DATE OF BIRTH:  January 20, 1941   DATE OF PROCEDURE:  12/10/2007  DATE OF DISCHARGE:                               OPERATIVE REPORT   PREOPERATIVE DIAGNOSES:  Severe aortic stenosis and two-vessel coronary  artery disease.   POSTOPERATIVE DIAGNOSIS:  Severe aortic stenosis and two-vessel coronary  artery disease.   PROCEDURE:  Median sternotomy for aortic valve replacement (21-mm  Edwards Magna pericardial tissue valve) and the coronary artery bypass  grafting x2 (left internal mammary artery to distal left anterior  descending coronary artery, saphenous vein graft to distal right  coronary artery, and endoscopic saphenous vein harvest from right  thigh).   SURGEON:  Salvatore Decent. Cornelius Moras, MD   ASSISTANT:  Rowe Clack, PA-C   ANESTHESIA:  General.   BRIEF CLINICAL NOTE:  The patient is a 70 year old morbidly obese female  with known history of aortic stenosis and hypertension.  The patient  presents with worsening symptoms of exertional shortness of breath.  Echocardiogram demonstrates severe aortic stenosis with normal left  ventricular systolic function.  Cardiac catheterization demonstrates  severe two-vessel coronary artery disease with normal left ventricular  function and mild pulmonary hypertension.  A full consultation note has  been dictated previously.  The patient and her family have been  counseled at length regarding the indications, risks, and potential  benefits of surgery.  Alternative to treatment strategies have been  discussed.  They understand and accept all associated risks and desired  to proceed with surgery as described.   OPERATIVE FINDINGS:  1. Normal left ventricular systolic function with moderate-to-severe      left ventricular hypertrophy.  2.  Severe aortic stenosis.  3. Good-quality left internal mammary artery conduit for grafting.  4. Fair-quality saphenous vein conduit for grafting.  5. Good-quality target vessels for grafting.   OPERATIVE NOTE IN DETAIL:  The patient was brought to the operating room  on the above-mentioned date and central monitoring was established by  the Anesthesia Service under the care and direction of Dr. Hart Robinsons.  Specifically, a Swan-Ganz catheter was placed through the  right internal jugular approach.  A radial arterial line was placed.  Intravenous antibiotics were administered.  Following induction with  general endotracheal anesthesia, a Foley catheter was placed.  The  patient's chest, abdomen, both groins, and both lower extremities were  prepared and draped in sterile manner.   Baseline transesophageal echocardiogram was performed by Dr. Gelene Mink.  This demonstrates severe aortic stenosis.  There was normal left  ventricular systolic function.  There was mild mitral regurgitation.  There was mild tricuspid regurgitation.  There was moderate-to-severe  left ventricular hypertrophy.  There was mild-to-moderate aortic  insufficiency.   A median sternotomy incision was performed, and the left internal  mammary artery was dissected from the chest wall and prepared for bypass  grafting.  The  left internal mammary artery was a good-quality conduit.  Simultaneously, saphenous vein was obtained from the patient's right  thigh using endoscopic vein harvest technique.  The saphenous vein was a  fair-quality conduit.  After the saphenous vein had been removed, the  small incisions in the right thigh were closed in multiple layers with  interrupted nylon vertical mattress sutures.  The patient was  heparinized systemically, and the left internal mammary artery was  transected distally.  It was noted to have excellent flow.   The pericardium was opened.  There was moderate-to-severe   atherosclerotic plaque in the transverse aortic arch.  The proximal  ascending thoracic aorta appears fairly normal externally.  The aortic  cannula was placed somewhat low to avoid atherosclerotic plaque.  Venous  cannula was placed through the right atrium.  The retrograde  cardioplegic catheter was placed through the right atrium into coronary  sinus.  Adequate heparinization was verified.  Cardiopulmonary bypass  was begun, and the left ventricular vent was placed through the right  superior pulmonary vein.  Distal target vessels were selected for  coronary bypass grafting.  A temperature probe was placed in the left  ventricular septum, and a cardioplegic catheter was placed in the  ascending aorta.   The patient was cooled to 32 degrees systemic temperature.  The aortic  cross-clamp was applied, and cold blood cardioplegia was delivered  initially in antegrade fashion through the aortic root.  Iced saline  slush was applied for topical hypothermia, and supplemental cardioplegia  was administered retrograde through the coronary sinus catheter.  The  initial cardioplegic arrest and myocardial cooling was felt to be  satisfactory.  Repeat doses of cardioplegia were administered  intermittently throughout the cross-clamp portion of the operation  through the aortic root, down the subsequently placed vein graft, and  retrograde through the coronary sinus catheter to maintain left  ventricular septal temperature below 15 degrees centigrade.   The following distal coronary anastomoses were performed:  1. The distal right coronary artery was grafted with a saphenous vein      graft in an end-to-side fashion.  This vessel measures 2.0 mm in      diameter and was a good-quality target vessel for grafting.  2. The distal left anterior descending coronary artery was grafted      with left internal mammary artery in an end-to-side fashion.  This      vessel measures 2.0 mm in diameter and was  a good-quality target      vessel for grafting.   An oblique aortotomy incision was performed.  The aortic valve was  inspected.  The aortic valve was tricuspid, but severely stenotic and  heavily calcified.  The aortic valve was excised sharply.  There was  minimal calcification in the aortic annulus.  The aortic root was  irrigated with saline solution.  The aortic root was sized to accept a  21-mm stented bioprosthetic tissue valve.   Aortic valve replacement was performed using interrupted 2-0 Ethibond  horizontal mattress pledgeted sutures with pledgets in the subannular  position.  A 21-mm Edwards Magna pericardial tissue valve (model #3000,  serial [id number]) was secured in place uneventfully.  Rewarming was  begun.  The aortic root was irrigated with saline solution.  Of note,  one can appreciate friable atherosclerotic plaque in the distal  ascending aorta below the cross-clamp, but above the aortotomy incision.  The aortotomy incision was closed using a two-layer closure of running 4-  0  Prolene suture.  The single proximal saphenous vein anastomoses was  performed directly to the ascending aorta prior to removal of the aortic  cross-clamp.  The left ventricular septal temperature rises rapidly with  reperfusion of the left internal mammary artery.  One final dose of warm  retrograde hot shot cardioplegia was administered.  The lungs were  ventilated and the heart allowed to fill to evacuate any residual air  through the aortic root.  The aortic cross-clamp was removed after total  cross-clamp time of 104 minutes.   The heart began to beat spontaneously without need for cardioversion.  Proximal and distal coronary anastomoses were inspected for hemostasis  in appropriate graft orientation.  The saphenous vein graft was notably  a little bit too short when the right heart was allowed to fill.  Subsequently, the vein is transected in a short interposition of  saphenous vein  graft was added to extend the length.  Epicardial pacing  wire was fixed to the right ventricular free wall into the right atrial  appendage.  The patient was rewarmed to 37 degrees centigrade  temperature.  The retrograde cardioplegic catheter and a left  ventricular vent were removed.  The patient was weaned from  cardiopulmonary bypass without difficulty.  The patient's rhythm at  separation from bypass was AV block.  AV sequential pacing was employed.  Total cardiopulmonary bypass time for the operation was 151 minutes.   Follow up transesophageal echocardiogram performed by Dr. Gelene Mink  after separation from bypass demonstrates no changes in left ventricular  function.  There was a well-seated bioprosthetic tissue valve in the  aortic position.  There was no sign of any perivalvular leak.  There  remains mild mitral regurgitation.  There was mild-to-moderate tricuspid  regurgitation.  There was no residual air.   The venous and arterial cannulae were removed uneventfully.  Protamine  was administered to reverse the anticoagulation.  The mediastinum and  the left chest were irrigated with saline solution containing  vancomycin.  Meticulous surgical hemostasis was ascertained.  The  mediastinum and the left chest were drained with 3 chest tubes exited  through separate stab incisions inferiorly.  The pericardium and soft  tissues anterior to the aorta were reapproximated loosely.  The sternum  was closed with double-strength sternal wire.  The soft tissues anterior  to the sternum were closed in multiple layers, and the skin was closed  with interrupted vertical mattress Prolene sutures.   The patient tolerated the procedure well and was transported to the  Surgical Intensive Care Unit in stable condition.  There were no  intraoperative complications.  All sponge, instrument, and needle counts  were verified correct at completion of the operation.      Salvatore Decent. Cornelius Moras, M.D.   Electronically Signed     CHO/MEDQ  D:  12/10/2007  T:  12/10/2007  Job:  [id number]   cc:   [name], MD  Bevelyn Buckles. Bensimhon, MD  Doreen Beam, MD

## 2010-11-06 NOTE — Assessment & Plan Note (Signed)
OFFICE VISIT   Mcgaha, Hanin H  DOB:  Apr 16, 1941                                        September 05, 2008  CHART #:  [id number]   The patient returns for further followup related to her right thigh  wound.  She was last seen here in the office on August 15, 2008.  Since then, she has done quite well.  Her only problem has been  continued difficulty with [facility] as they have attempted  to deny paying for her outpatient wound VAC therapy.  Clinically, she  has done fine.  She has had no fevers.  She is very proud to display her  wound.  After the [facility] was removed, it is apparent that the wound has  granulated in remarkably well and now the wound has contracted down to a  very small size.  It is completely superficial.  The wound is clean with  healthy granulation tissue.  It cannot be probed deep at all in any  areas and it appears to be healing over.  There is no longer any need to  continue with the wound VAC itself.  We will switch her to normal  saline, moist and wet-to-dry gauze until the small remaining wound has  healed over entirely.  We will plan to see her back in 2 months.  The  patient is delighted with her progress.  If the wound stagnates or  starts to get worse after the [employer] [facility] has been discontinued, she will call  or return to see [place] as soon as possible.   Salvatore Decent. Cornelius Moras, M.D.  Electronically Signed   CHO/MEDQ  D:  09/05/2008  T:  09/06/2008  Job:  [id number]   cc:   [name], MD  Doreen Beam, MD

## 2010-11-06 NOTE — Assessment & Plan Note (Signed)
OFFICE VISIT   [name], [name]  DOB:  1941-02-20                                        August 01, 2008  CHART #:  [id number]   The patient returned for wound check today.  She had been doing quite  well until several days ago, when she developed a severe allergic  reaction, presumably due to one of her blood pressure medications.  She  had just gotten her prescription refilled, and she was given completely  new preparation by the pharmacist.  Subsequent to that she developed  diffuse webs all over with severe pruritus and swelling of her face and  mouth.  She had to go to the emergency room where she was treated with  steroids and Benadryl.  She is now taking a prednisone dosepak.  She has  had no problems with her wound VAC, and home health nurses have been  coming 3 times a week to change her VAC.  She has not had any fevers or  chills.  The remainder of her review of systems is unrevealing.  The  remainder of her past medical history is unchanged.   PHYSICAL EXAMINATION:  GENERAL:  Notable for well-appearing obese  female.  VITAL SIGNS:  Blood pressure 143/69, oxygen saturation 94% on room air,  and pulse 78.  EXTREMITIES:  Limited examination of a right thigh wound is performed.  This wound displays healthy granulation tissue throughout, and there are  signs of wound contraction and peripheral epithelialization.  The wound  now measures 2 x 5 cm in diameter and in the midportion it is  approximately 2 cm deep.  This is filled in quite a bit since her wound  was debrided.  The wound is granulating symmetrically, and there is no  sign of any fibrinous tissue or other problems.  There is no surrounding  cellulitis.  A new wound VAC is replaced.   IMPRESSION:  The patient's right thigh wound is progressing nicely.   PLAN:  We will have her return in 3 weeks' time.  By then, it may be  that the wound will have contracted to the point where wound VAC  therapy  will no longer be needed.   Salvatore Decent. Cornelius Moras, M.D.  Electronically Signed   CHO/MEDQ  D:  08/01/2008  T:  08/01/2008  Job:  [id number]   cc:   [name], MD  Doreen Beam, MD

## 2010-11-06 NOTE — Assessment & Plan Note (Signed)
[facility]                          [facility] OFFICE NOTE   NAME:[name], [name]                          MRN:          [id number]  DATE:04/18/2008                            DOB:          02-24-1941    PRIMARY CARE PHYSICIAN:  Doreen Beam, MD   CARDIOTHORACIC SURGEON:  Salvatore Decent. Cornelius Moras, MD   REASON FOR VISIT:  Scheduled followup.   HISTORY OF PRESENT ILLNESS:  I saw Ms. Belkin back in August.  She  continues to recuperate fairly well following aortic valve replacement  as already detailed.  Most recently, she has had problems with healing  of a right lower extremity cellulitis with wound that has been followed  by Dr. Cornelius Moras.  Since I last saw her, this area has been further debrided  and she now has a wound vac in place.  She is due to see Dr. Cornelius Moras back  next week.  Other than this, she is reporting stable symptomatology with  no palpitations and no significant chest pain.  She is tolerating her  medical therapy including a change from amiodarone to Toprol-XL.  She  continues on Coumadin without any bleeding problems.  Weight is overall  stable and 248 pounds.   ALLERGIES:  PENICILLIN.   MEDICATIONS:  1. Aspirin 325 mg p.o. daily.  2. Nu-Iron 150 mg p.o. daily.  3. Coumadin as directed by the Coumadin clinic.  4. Omega 3 supplements 4 g daily.  5. Lasix 60 mg p.o. daily.  6. Potassium chloride 30 mEq p.o. daily.  7. Tricor 140 mg p.o. daily.  8. Folic acid 1 mg p.o. daily.  9. Zetia 10 mg p.o. daily.  10.Levothyroxine 88 mcg p.o. daily.  11.Toprol-XL 25 mg p.o. daily.   REVIEW OF SYSTEMS:  As described in the history of present illness.  Otherwise negative.   PHYSICAL EXAMINATION:  VITAL SIGNS:  Blood pressure is 133/70, heart  rate is 71, and weight is 248 pounds.  GENERAL:  The patient is comfortable and in no acute distress.  NECK:  No elevated jugular venous pressure.  No loud bruits.  LUNGS:  Clear.  No labored breathing at rest.  CHEST:  Examination the chest wall shows a well-healed sternal incision.  Cardiac examination reveals a regular rate and rhythm.  Soft systolic  murmur at the base, grade 2/6, no diastolic murmur or pericardial rub.  EXTREMITIES:  Chronic-appearing edema and venous stasis.  This looks to  be stable.  There is a wound vac in place on the right leg.  She does  not appear to have much drainage in the reservoir.   IMPRESSION AND RECOMMENDATIONS:  1. History of severe aortic stenosis and two-vessel obstructive      cardiovascular disease status post bioprosthetic aortic valve      replacement with concurrent two-vessel bypass grafting.  Ms. Kadow is      recuperating quite well.  She will continue her present medications      and I will see her back over the next 4 months.  2. Postoperative atrial fibrillation, maintaining sinus rhythm  at this      point.  She continues on Coumadin with a CHADS2 score of 3 and is      tolerating Toprol-XL in lieu of amiodarone.     [name], MD  Electronically Signed    SGM/MedQ  DD: 04/18/2008  DT: 04/18/2008  Job #: [id number]   cc:   Salvatore Decent. Cornelius Moras, M.D.

## 2010-11-06 NOTE — Assessment & Plan Note (Signed)
OFFICE VISIT   [name], [name]  DOB:  1941/02/21                                        August 15, 2008  CHART #:  [id number]   The patient returns to the office today for followup of her right thigh  wound.  She has done well over the last few weeks.  Home health nurses  who had been changing her wound VAC, had been concerned about a firm  area alongside of her wound.  She otherwise has no complaints and has  been getting along quite well.  She has not had any fevers.  Examination  of the wound today demonstrates considerable improvement with further  symmetrical wound contraction and granulation.  The wound now measures 4  x 1.5 x 1 cm in its greatest dimensions, with 1.0 cm depth.  There is  healthy, beefy granulation tissue throughout.  The wound is healing in  nicely.  On probing, there are no pockets at all.  There is no  surrounding cellulitis.  One can palpate some firm scar tissue beneath  the wound inferiorly, but I believe this is to be expected and  appropriate under the circumstances.  I see nothing to be concerned  about and I am pleased with progress.   PLAN:  We will have the patient continue on the wound VAC for now.  She  will return for a wound check in 3 weeks.   Salvatore Decent. Cornelius Moras, M.D.  Electronically Signed   CHO/MEDQ  D:  08/15/2008  T:  08/15/2008  Job:  [id number]   cc:   [name], MD  Doreen Beam, MD

## 2010-11-06 NOTE — Consult Note (Signed)
NAME:  [name], [name]                   ACCOUNT NO.:  [id number]   MEDICAL RECORD NO.:  [id number]          PATIENT TYPE:  INP   LOCATION:  2003                         FACILITY:  [facility]   PHYSICIAN:  Salvatore Decent. Cornelius Moras, M.D. DATE OF BIRTH:  20-Jul-1940   DATE OF CONSULTATION:  DATE OF DISCHARGE:                                 CONSULTATION   REQUESTING PHYSICIAN:  Bevelyn Buckles. Bensimhon, MD   PRIMARY CARDIOLOGIST:  [name], MD   PRIMARY CARE PHYSICIAN:  Doreen Beam, MD   REASON FOR CONSULTATION:  Severe aortic stenosis and 2-vessel coronary  artery disease.   HISTORY OF PRESENT ILLNESS:  [name] is a 70 year old morbidly obese  white female with long-standing history of aortic stenosis who has been  followed recently by Dr. [name].  She was first noted to have a  heart murmur during childhood and she apparently was treated for scarlet  fever and rheumatic fever.  Since then, she has been followed  clinically.  In recent years, she had been followed through the [facility]  Cardiology office in [place] by Dr. [name].  In early April 2009,  she returned with worsening symptoms of exertional shortness of breath.  Follow up echocardiogram was performed at [facility] in [place]  being January of this year.  By report, this echocardiogram demonstrated  progression of aortic stenosis to the point of being severe.  Specifically, the peak and mean transvalvular gradients across the  aortic valve were estimated 76 and 47 mmHg respectively.  Calculated  aortic valve area was 0.9 centimeter squared.  Left ventricular function  remained normal.  There was moderate concentric left ventricular  hypertrophy.  There was report of mild mitral regurgitation.  There was  evidence suggestive of mild pulmonary hypertension.  No other  significant abnormalities were reported.  The patient subsequently  returned for further follow up in May and was ultimately scheduled for  elective left  and right heart catheterization.  This was performed today  by Dr. Gala Romney.  Findings were notable for the presence of severe 2-  vessel coronary artery disease.  The gradient across the aortic valve  was not measured.  There was with moderate pulmonary hypertension.  Cardiothoracic surgical consultation was requested.   REVIEW OF SYSTEMS:  GENERAL:  The patient has stable appetite.  She is 5  feet 2 inches tall and weighs approximately 258 pounds.  CARDIAC:  The  patient describes a 29-month progression of symptoms of exertional  shortness of breath, now functional class II to class III.  She denies  resting shortness of breath.  She denies PND, orthopnea, palpitations,  or syncope.  The patient has intermittent lower extremity edema.  The  patient denies any symptoms of chest pain either with activity or at  rest.  RESPIRATORY:  Notable only for exertional shortness of breath.  The patient reports no productive cough, hemoptysis, or wheezing.  GASTROINTESTINAL:  Negative.  The patient has no difficulty swallowing.  She denies hematochezia, hematemesis, or melena.  She does have  intermittent bright red blood from hemorrhoids  that is mild.  MUSCULOSKELETAL:  Notable for chronic arthralgias related to  osteoarthritis, degenerative arthritis, and rheumatoid arthritis.  This  afflicts her hands, both knees, both feet, and to a milder degree her  lower back.  The patient is able to ambulate slowly, and she does not  use a walker nor a cane.  NEUROLOGIC:  Negative.  The patient denies  symptoms suggestive of previous TIA or stroke.  GENITOURINARY:  Negative.  HEENT:  Negative.  The patient is edentulous.  INFECTIOUS:  Negative   PAST MEDICAL HISTORY:  1. Aortic stenosis.  2. Hypertension.  3. Hypothyroidism.  4. Morbid obesity.  5. Long-standing arthritis.   PAST SURGICAL HISTORY:  Subtotal thyroidectomy in the distant past for  multinodular goiter.   FAMILY HISTORY:   Noncontributory.   SOCIAL HISTORY:  The patient is widowed and lives with her daughter in  [place].  She lives a sedentary lifestyle.  She has a remote history of  tobacco use, but she quit smoking more than 15 years ago.  She denies  significant alcohol consumption.   MEDICATIONS PRIOR TO ADMISSION:  1. Levoxyl 88 mcg daily.  2. Metoprolol 25 mg twice daily.  3. Meloxicam 7.5 mg twice daily.  4. Darvocet as needed for pain.  5. TriCor 145 mg daily at bedtime.  6. Zetia 10 mg daily at bedtime.  7. Lasix 40 mg as needed for leg swelling.  8. Methotrexate 2.5 mg once weekly.  9. Folic acid 1 mg daily at bedtime.   DRUG ALLERGIES:  PENICILLIN and the patient reports sensitivity to  STATINS.   PHYSICAL EXAM:  The patient is a well-appearing, morbidly obese white  female who appears her stated age in no acute distress.  Blood pressure  measured 160/72, pulse 65 in sinus rhythm, and oxygen saturation 96% on  room air.  The patient is afebrile.  HEENT exam is unrevealing.  The  neck is supple.  There is no palpable lymphadenopathy.  Auscultation of  the chest demonstrates clear breath sounds which are symmetrical  bilaterally.  Cardiovascular exam includes regular rate and rhythm.  There is a crescendo-decrescendo systolic murmur heard best along the  right upper sternal border with radiation to the neck.  No diastolic  murmurs are noted.  The abdomen is quite obese, but soft and nontender.  There are no palpable masses.  Bowel sounds are present.  The  extremities are warm and adequately perfused.  There is trace bilateral  lower extremity edema.  Distal pulses are not palpable in either lower  leg at the ankle.  Rectal and GU exams are both deferred.  The skin is  clean, dry, and healthy appearing throughout.  Neurologic examination is  grossly nonfocal and symmetrical throughout.   DIAGNOSTIC TESTS:  Cardiac catheterization performed by Dr. Gala Romney  earlier today is reviewed.  This  demonstrates severe 2-vessel coronary  artery disease.  Specifically, there are tandem 99% and 95% lesions in  the mid left anterior descending coronary artery after takeoff of the  large diagonal branch.  There are luminal irregularities in the left  circumflex system.  There is 70% stenosis of the mid right coronary  artery with right dominant coronary circulation.  There is moderate  pulmonary hypertension with PA pressures measuring 41/21 and pulmonary  capillary wedge pressure of 21.  Baseline cardiac output was 5.3 L per  minute corresponding to a cardiac index of 2.5 using the Fick method.   IMPRESSION:  Severe aortic stenosis  with normal left ventricular  systolic function, moderate left ventricular hypertrophy, and severe 2-  vessel coronary artery disease.  The patient presents with worsening  symptoms of exertional shortness of breath.  I believe she would best be  treated with elective aortic valve replacement and coronary artery  bypass grafting.  She will be at somewhat increased risk for surgery due  to her morbid obesity as well as her underlying limited mobility with  underlying severe arthritis.   PLAN:  I have discussed options at length with [name] here in the  hospital this afternoon.  Alternative treatment strategies have been  discussed.  She understands and accepts all associated risks of surgery  including but not limited to risk of death, stroke, myocardial  infarction, congestive heart failure, respiratory failure, pneumonia,  bleeding requiring blood transfusion, arrhythmia, infection, recurrent  coronary artery disease, late complications related to valve  replacement.  We have discussed alternatives with respect to valve  replacement and [name] desires that we use a bioprosthetic tissue valve  to avoid the need for long-term anticoagulation with Coumadin.  She  understands that there is a small but nonetheless significant risk of  late structural valve  deterioration and failure depending upon her  longevity.  All of their questions have been addressed.      Salvatore Decent. Cornelius Moras, M.D.  Electronically Signed     CHO/MEDQ  D:  12/08/2007  T:  12/09/2007  Job:  [id number]   cc:   [name], MD  Bevelyn Buckles. Bensimhon, MD  Doreen Beam, MD

## 2010-11-06 NOTE — Assessment & Plan Note (Signed)
[name]                          [facility] NOTE   NAME:[name], April LACEY                          MRN:          [id number]  DATE:09/28/2008                            DOB:          08/06/1940    PRIMARY CARE PHYSICIAN:  Doreen Beam, MD   REASON FOR VISIT:  Routine followup.   HISTORY OF PRESENT ILLNESS:  [name] comes back in for a 69-month visit.  She is doing very well, status post aortic valve replacement.  She has  had a right lower extremity cellulitis that has been followed by Dr.  Cornelius Moras very closely and has required 2 debridements.  This appears to be  healing well at this point and she is no longer using a wound VAC.  Dr.  Cornelius Moras took her off of Coumadin, as she has maintained sinus rhythm  following her surgery.  She has gone back to her walking regimen and  continues to enjoy playing bridge.   ALLERGIES:  PENICILLIN.   PRESENT MEDICATIONS:  1. Calcium 500 mg p.o. b.i.d.  2. Glucosamine and chondroitin twice daily.  3. Centrum Silver daily.  4. Methotrexate 2.5 mg p.o. t.i.d.  5. Meloxicam 7.5 mg p.o. nightly.  6. Toprol-XL 25 mg p.o. daily.  7. Levothyroxine 88 mcg p.o. daily.  8. Zetia 10 mg p.o. daily.  9. Folic acid 1 mg p.o. daily.  10.TriCor 145 mg p.o. daily.  11.K-Dur 20 mEq p.o. daily.  12.Lasix 40 mg p.o. daily.  13.Omega-3 supplements 2000 mg p.o. b.i.d.  14.Nu-Iron 150 mg p.o. daily.  15.Aspirin 325 mg p.o. daily.   REVIEW OF SYSTEMS:  As outlined above.  She still suffers with arthritic  complaints, although this is better on her present medical therapy.  She  has had no palpitations, weakness, memory or speech deficits, otherwise  reviewed and negative.   PHYSICAL EXAMINATION:  VITAL SIGNS:  Blood pressure is 107/64, using a  large cuff on the right; heart rate is 61 and regular; and weight is 251  pounds.  GENERAL:  This is an obese woman in no acute distress.  NECK:  No elevated jugular venous pressure.   No bruits.  LUNGS:  Clear without labored breathing at rest.  CARDIAC:  A regular rate and rhythm with a 2/6 systolic murmur at the  base as before.  No diastolic murmur.  EXTREMITIES:  Exhibit venous stasis.  No pitting edema.  The right lower  medial thigh wound appears to be healing well.   IMPRESSION AND RECOMMENDATIONS:  1. Severe aortic stenosis with two-vessel obstructive cardiovascular      disease status post bioprosthetic aortic valve replacement with      concurrent two-vessel bypass grafting by Dr. Cornelius Moras.  [name] is      doing well.  I encouraged her to continue her regular exercise      regimen and I will see her back in 6 months.  2. History of postoperative atrial fibrillation, maintaining normal      sinus rhythm.  She was taken off of Coumadin by Dr. Cornelius Moras.  Her      CHADS2 score is 3 and she is tolerating Toprol-XL.  I did speak      with her about resuming Coumadin if she shows any recurrence of      atrial fibrillation, although she very much prefers to stay off of      this medicine particularly as it relates to her use of anti-      inflammatory and pain medicines for her arthritis.  She will      continue on full-dose aspirin for the time being.     [name], MD  Electronically Signed    SGM/MedQ  DD: 09/28/2008  DT: 09/29/2008  Job #: [id number]   cc:   Doreen Beam, MD

## 2010-11-06 NOTE — Assessment & Plan Note (Signed)
OFFICE VISIT   [name]  DOB:  July 26, 1940                                        February 22, 2008  CHART #:  [id number]   HISTORY OF PRESENT ILLNESS:  The patient returns for further followup  related to her endoscopic vein harvest wound infection.  She was last  seen here in the office on February 01, 2008.  At that time, the small  open wound in the right thigh was open and granulating and appeared  clean.  Since then, she has continued to do well, but she reports that  over the last week, she developed some tenderness in this region again.  The home health nurse notes that there has not been much progress in  wound healing.  She has not had any fevers.  The remainder of her review  of systems is unrevealing.  She continues to follow up with Dr. Diona Browner  at [facility].  She is otherwise getting along reasonably  well.   PHYSICAL EXAMINATION:  GENERAL:  Notable for a well-appearing obese  female who is afebrile.  VITAL SIGNS:  Blood pressure is 155/81 and pulse 83 and regular.  CHEST:  Her sternal incision has healed completely.  Breath sounds are  clear.  All of the surgical incisions from endoscopic vein harvest have  healed completely with exception of this small incision located just  above the right knee and the right thigh.  This incision has granulated  and enclosed somewhat, but there remains an open wound in the  midportion.  This can be probed to 1 cm depth.  Bloody drainage can be  evacuated.  There is some mild tenderness in this region.  There is no  surrounding cellulitis other than a very mild amount of erythema right  around the wound itself.   IMPRESSION:  Slowly healing open wound with some returning cellulitis in  the small endoscopic vein harvest and wound in the right thigh just  above the right knee.   PLAN:  We have cultured this wound today and we will restart the patient  on oral antibiotics (Keflex 500 mg p.o.  twice daily).  The wound seems  clean and we will continue wound packing on a daily basis with home  health nursing.  We will have the patient return in 3 weeks.  If this  does not continue to improve, we may ultimately have to consider opening  the wound further for debridement.   Salvatore Decent. Cornelius Moras, M.D.  Electronically Signed   CHO/MEDQ  D:  02/22/2008  T:  02/23/2008  Job:  [id number]   cc:   [name], MD  Doreen Beam, MD

## 2010-11-06 NOTE — H&P (Signed)
HISTORY AND PHYSICAL EXAMINATION   July 04, 2008   Re:  [name], [name]       DOB:  February 23, 1941   Date of planned hospital admission July 11, 2008.   PRESENTING CHIEF COMPLAINT:  Right thigh wound.   HISTORY OF PRESENT ILLNESS:  The patient is a 70 year old obese white  female who originally underwent aortic valve replacement and coronary  artery bypass grafting x2 on December 10, 2007.  She developed right thigh  wound infection requiring excisional debridement of the right thigh with  wound VAC placement in October.  This initially seemed to do well, and  her thigh wound filled in to the point where the wound VAC was  discontinued in November.  However, since then she has developed chronic  draining sinus, which has failed to heal in the right thigh wound.  She  has not had any fevers or chills.  She has not had any redness or  swelling around the wound.  She has not had any purulent drainage.  The  remainder of her review of systems is unremarkable and in particular,  she has not had any problems with chest pain or shortness of breath.   PAST MEDICAL HISTORY:  1. Aortic stenosis, status post aortic valve replacement.  2. Coronary artery disease, status post coronary artery bypass      grafting x2.  3. Obesity.  4. Hypertension.  5. Hyperlipidemia  6. Gout.  7. Degenerative arthritis.  8. Psoriasis.   CURRENT MEDICATIONS:  1. Aspirin 325 mg daily.  2. Iron 150 mg daily.  3. Coumadin 2.5 mg daily.  4. Lasix 60 mg daily.  5. Potassium chloride 30 mEq daily.  6. Fish oil capsule.  7. Levoxyl 88 mcg daily.  8. TriCor 145 mg daily.  9. Zetia 10 mg daily.  10.Folic acid 1 mg daily.  11.MiraLax.  12.Toprol-XL 25 mg daily.   DRUG ALLERGIES:  1. Penicillin.  2. Statins.   PHYSICAL EXAMINATION:  GENERAL:  The patient is an obese female.  VITAL SIGNS:  Blood pressure 175/78, pulse 68, and oxygen saturation 94%  on room air.  CHEST:  A well-healed median  sternotomy scar.  Auscultation of the chest  demonstrates clear breath sounds, which are symmetrical.  CARDIOVASCULAR:  Notable for regular rate and rhythm.  There is a grade  2/6 systolic murmur heard along the sternal border.  ABDOMEN:  Soft,  nontender.  EXTREMITIES:  Warm and well perfused.  The chronic wound in the right  thigh just above the right knee is examined.  There remains a punctate  opening which after being cleaned with Betadine solution is probed with  sterile Q-tip swab culture.  This probe is approximately 2 cm depth and  bloody drainage is emanated.  There is no surrounding erythema.  There  is no purulence.  There is no lower extremity edema.  The remainder of her physical exam is unrevealing.   IMPRESSION:  Chronic draining sinus and nonhealing wound involving the  distal right thigh endoscopic vein harvest incision.  This has failed to  yield despite being re-excised with wound vacuum-assisted closure  (dressing ) placement 3 months ago.   PLAN:  We will send the patient for a limited CT scan of the thigh to  rule out retained foreign body.  We will tentatively plan further  excisional debridement of the wound with wound VAC replacement next  Friday.  She will stop taking Coumadin today in anticipation of this  surgery.  I  think it would be reasonable for her to come off of Coumadin regardless  at this point as she has been maintaining sinus rhythm and is now more  than 6 months out from her original surgery.   Salvatore Decent. Cornelius Moras, M.D.  Electronically Signed   CHO/MEDQ  D:  07/04/2008  T:  07/04/2008  Job:  [id number]   cc:   [name], MD  Doreen Beam, MD

## 2010-11-06 NOTE — H&P (Signed)
HISTORY AND PHYSICAL EXAMINATION   March 28, 2008   Re:  Mullenax, [name]       DOB:  1941-03-06   Date of planned surgery April 01, 2008.   HISTORY OF PRESENT ILLNESS:  The patient returns to the office today for  follow up related to right thigh endoscopic vein harvest wound  infection.  She originally underwent aortic valve replacement and  coronary artery bypass grafting x2 on December 10, 2007.  Her early  postoperative course was notable for postoperative atrial fibrillation  as well as questionable history of a transient neurologic deficit  potentially consistent with a minor stroke without residual defect.  Brain CT scans were normal.  She was ultimately discharged home on December 23, 2007.  Prior to hospital discharge, she developed a local wound  infection involving her right thigh endoscopic vein harvest incision.  She was initially treated with oral Keflex, and ultimately, her wound  was opened and packed.  Since then, she has been followed in the office  with home health nursing care to assist for packing of her right thigh  wound, this had never healed completely.  Intermittently, she will have  periods where it does quite well, then when she completes the course of  oral antibiotics, she will develop increased drainage and tenderness.  This has continued off and on intermittently ever since her original  surgery.  She denies any fevers or chills.  She still has drainage from  the small open wound just above the right knee.  She is otherwise doing  quite well and getting around well.  Her appetite is stable.  She has no  shortness of breath.  She has no tachy palpitations.  She has never had  any transient neurologic deficits or other problems to suggest possible  cerebrovascular disease.  The remainder of her review of systems is  unrevealing.   PAST MEDICAL HISTORY:  Unchanged from previously.   CURRENT MEDICATIONS:  Aspirin 325 mg daily, Coumadin 2.5 mg  alternating  with 5 mg daily, iron 150 mg daily, Lasix 40 mg daily, potassium 20 mEq  daily, fish oil capsules 4 daily, Levoxyl 88 mcg daily, TriCor 145 mg  daily, Zetia 10 mg daily, folic acid 1 mg daily, stool softener 1 tablet  daily, and Toprol-XL 25 mg daily.   PHYSICAL EXAMINATION:  GENERAL:  Notable for well-appearing morbidly  obese female.  VITAL SIGNS:  Blood pressure 152/71 and oxygen saturation 92% on room  air.  She is afebrile.  CHEST:  Clear breath sounds, which are symmetrical.  Her sternal scar  has healed and the sternum is stable.  CARDIOVASCULAR:  Notable for regular rate and rhythm.  ABDOMEN:  Soft and nontender.  EXTREMITIES:  Warm and well perfused.  All of the small incisions from  endoscopic vein harvest from the right lower extremity have healed with  exception of the small incision just above the right knee.  There is a  small punctate open area, which can be probed to about 1.5 cm deep.  There is some bloody drainage and mild tenderness.  There is no  surrounding cellulitis.  There is no fluctuance on exam.  There is no  cellulitis below the leg.  Pulses are diminished, but palpable in the  posterior tibial position.  Remainder of physical exam is  noncontributory.   IMPRESSION:  Nonhealing right thigh endoscopic vein harvest infection  with likely a retained suture material and/or hematoma and chronic  draining  sinus.   PLAN:  We will plan open incision, irrigation, debridement, and drainage  of this.  Depending upon, how much further debridement is necessary, we  may ultimately place a wound VAC.  This can be done as an outpatient and  nothing this is certainly feasible.  She has been taking Coumadin, so we  will have to wait a few days for the effects of Coumadin to dissipate  prior to surgery.  We will check her prothrombin time today.   Salvatore Decent. Cornelius Moras, M.D.  Electronically Signed   CHO/MEDQ  D:  03/28/2008  T:  03/28/2008  Job:  [id number]   cc:    [name], MD  Doreen Beam, MD

## 2010-11-06 NOTE — Cardiovascular Report (Signed)
NAME:  [name], [name]                   ACCOUNT NO.:  [id number]   MEDICAL RECORD NO.:  [id number]          PATIENT TYPE:  OIB   LOCATION:  1962                         FACILITY:  [facility]   PHYSICIAN:  Bevelyn Buckles. Bensimhon, MDDATE OF BIRTH:  July 02, 1940   DATE OF PROCEDURE:  12/08/2007  DATE OF DISCHARGE:                            CARDIAC CATHETERIZATION   PRIMARY CARE PHYSICIAN:  Doreen Beam, MD.   CARDIOLOGIST:  [name], MD.   PATIENT INDICATION:  [name] is a 70 year old woman with a history of  hypertension and morbid obesity.  She has been having progressive class  III dyspnea and was found to have severe aortic stenosis by  echocardiogram with a mean gradient of 47 mmHg.  She has not had any  chest pain.  She was seen by Dr. Diona Browner and brought in today for right  heart cath and coronary angiography for presurgical evaluation.   PROCEDURES PERFORMED:  1. Right heart cath.  2. Selective coronary angiography.  3. Bilateral selective renal angiography.  4. Left subclavian angiography.   DESCRIPTION OF PROCEDURE:  The risks and indication of catheterization  were explained.  Consent was signed and placed on the chart.  A 7-French  venous sheath was placed in the right femoral vein using a modified  Seldinger technique and a standard Swan-Ganz catheter was used for right  heart cath.  A 4-French arterial sheath was placed in the right femoral  artery using a modified Seldinger technique.  There was significant  opening pressure.  We placed a 4-French arterial sheath using a modified  Seldinger technique.  Standard catheters including JL-4 and 3DRC were  used for the procedure.  We did not attempt to cross the aortic valve  given high-quality echo data.  No apparent complications throughout the  procedure.   HEMODYNAMICS:  Right atrial pressure mean of 14, RV pressure of 41/15,  PA pressure 41/21 with a mean of 31, pulmonary capillary wedge pressure  with mean of 21,  central aortic pressure 120/54 with a mean of 79, Fick  cardiac output 5.3 liters per minute, cardiac index 2.5 liters per  minute per meter squared, PVR was 1.9 Wood units.   Left main was normal.   LAD was a long vessel coursing to the apex gave off a large branching  proximal diagonal.  There is a 30% ostial LAD lesion in the mid-LAD just  after the takeoff of the diagonal, there were tandem high-grade stenosis  of 99% and 95%.  There is TIMI III flow.  The apical LAD was very small.   Left circumflex was made up primarily of large OM-1.  There is a 30%  lesion in the proximal AV groove circumflex.   Right coronary artery was a large dominant vessel, had 20% proximal  lesion and 60-70% mid-lesion.   Left subclavian was widely patent.  LIMA was patent to the chest wall.   Renal angiography done to assess due to hypertension and significant  opening pressure.   Right renal artery had 30% stenosis.  Left renal artery was patent.  ASSESSMENT:  1. Two-vessel coronary disease with high-grade tandem LAD stenosis and      moderate-to-severe mid right coronary artery stenosis.  2. Severe aortic stenosis by echocardiogram.   Patent renal arteries bilaterally.   Mildly elevated filling pressures.   PLAN:  Given the high-grade severity of her LAD lesions in the phase of  severe aortic stenosis, I think the best plan would be to admit her for  her aortic valve replacement and coronary artery bypass graft.  We will  have Dr. Cornelius Moras see her in-house as he is already scheduled to see her  next week.      Bevelyn Buckles. Bensimhon, MD  Electronically Signed     DRB/MEDQ  D:  12/08/2007  T:  12/09/2007  Job:  [id number]   cc:   [name], MD  Doreen Beam, MD

## 2010-11-06 NOTE — Assessment & Plan Note (Signed)
OFFICE VISIT   Caggiano, Thalya H  DOB:  31-Dec-1940                                        January 18, 2008  CHART #:  [id number]   The patient comes in today for a 1-week wound recheck.  She was seen  last week with purulent drainage and separation of her EVH incision.  She has now completed a course of Keflex.  The results of her wound's  Gram stain showed moderate gram-negative rods and few gram-positive  cocci in pairs.  She has had marked improvement since her last visit.  There has been no more drainage from the incision and she is having no  erythema or tenderness.  She is currently packing the wound b.i.d. with  wet-to-dry dressings.   PHYSICAL EXAMINATION:  Vital Signs:  Blood pressure 169/75, pulse is 68,  respirations 18, and O2 sat 94%.  Chest:  Sternal incision has healed  well.  Heart:  Regular rate and rhythm without murmurs, rubs, or  gallops.  Lungs:  Clear.  Extremities:  She has persistent lower  extremity edema, but this actually looks improved from her last visit.  Her right thigh EVH site is now markedly less erythematous and there is  no drainage.  Upon probing, it appears to be granulating well, although  it is still approximately a centimeter deep.  I have repacked the wound  with 1/2-inch gauze strip and covered it with 2 x 2s.   ASSESSMENT AND PLAN:  The patient appears to be improving from her right  lower extremity endoscopic vein harvest site infection.  She has  finished with her course of antibiotics and we will plan on decreasing  the frequency of her dressing changes to once daily.  She will continue  to have [facility] evaluate her wound daily and she may keep  her scheduled followup appointment with Dr. Cornelius Moras on August 10th.  She is  asked to call in the interim if she experiences any further problems or  has any questions.   Salvatore Decent. Cornelius Moras, M.D.  Electronically Signed   GC/MEDQ  D:  01/18/2008  T:  01/18/2008  Job:   [id number]

## 2010-11-06 NOTE — Assessment & Plan Note (Signed)
OFFICE VISIT   [name], [name]  DOB:  1940-12-28                                        January 11, 2008  CHART #:  [id number]   The patient comes in today for a postoperative follow up.  She is status  post an aortic valve replacement with coronary artery bypass grafting x2  by Dr. Cornelius [name] on 12/10/2007.  She had prolonged postoperative course which  was complicated by atrial fibrillation, a questionable CVA and lower  extremity cellulitis.  She was discharged home on 12/23/2007 on Keflex  for her low lower extremity EVH site.  She completed the course of  Keflex, which was 1 week in duration, and states that when she came in  for suture removal to the office her wound fell open and local wound  care was ordered with Home Health to assist with dressing changes,  saline wet to dries daily.  After being off antibiotics for 1 week, she  states that she has had recurrence of drainage from the open wound.  Now, she is having a large amount of purulent foul-smelling drainage.  The area around it has not been erythematous and she has not had any  fever.  She phoned the office on Friday and a prescription for Keflex  was called in.  She is now on her third day of antibiotics without  significant improvement.  Otherwise, she has been doing well.  She has  seen Dr. Diona [name] and has been continued on Lasix as she has continued  to remain volume overloaded.  She is making good progress with PT and  mobility.  Her breathing has been stable and she otherwise has no  complaints.   PHYSICAL EXAMINATION:  VITALS SIGNS:  Blood pressure is 151/78, pulse is  87, respirations 18, O2 sat 93%, and temperature is 97.6.  Her sternal  incision has healed well.  Her sternum is stable.  HEART:  Regular rate and rhythm without murmurs, rubs or gallops.  LUNGS:  Clear.  EXTREMITIES:  Show ankle and pedal edema bilaterally, right greater than  left.  Her right thigh EVH site is open and is  approximately 1-2 cm  deep.  When the packing is removed today, there is a large amount of  purulent foul-smelling drainage and old blood in the wound tract.  The  surrounding tissues look pink and clean and the external portion of the  incision has no significant erythema.   ASSESSMENT/PLAN:  Overall, the patient is doing well following aortic  valve replacement (AVR) and coronary artery bypass grafting.  She does  appear to have a an EVH site infection.  I have sent a culture of the  wound and asked her to continue the Keflex pending the results of the  culture.  We will also increase her dressing changes to b.i.d. and will  have her follow up in 1 week for a wound recheck.  She is asked to phone  our office if she experiences any fevers, worsening drainage or redness  around the incision site itself.   [name]. [name], M.D.  Electronically Signed   GC/MEDQ  D:  01/11/2008  T:  01/12/2008  Job:  [id number]   cc:   April Sidle, MD

## 2010-11-06 NOTE — Discharge Summary (Signed)
NAME:  Shugars, Isella                   ACCOUNT NO.:  [id number]   MEDICAL RECORD NO.:  [id number]          PATIENT TYPE:  INP   LOCATION:  2041                         FACILITY:  [facility]   PHYSICIAN:  Salvatore Decent. Cornelius Moras, M.D. DATE OF BIRTH:  27-Oct-1940   DATE OF ADMISSION:  12/08/2007  DATE OF DISCHARGE:  12/23/2007                               DISCHARGE SUMMARY   ADDENDUM   This addendum will cover the period of December 18, 2007 to December 23, 2007.  For further details of this hospitalization, please see the previously  dictated discharge summary.   The patient continued to progress nicely throughout her hospitalization,  however, the incision in the right thigh became infected requiring  addition of antibiotics and further local wound care prior to discharge.  The patient otherwise remained quite stable and progressed nicely and  ultimately was deemed to be acceptable for discharge on December 23, 2007.   MEDICATIONS AT DISCHARGE:  1. Levoxyl 88 mcg daily.  2. TriCor 145 mg daily at bedtime.  3. Zetia 10 mg daily at bedtime.  4. Folic acid 1 mg q.p.m.  5. Aspirin 325 mg daily.  6. Nu-Iron 150 mg daily.  7. Amiodarone 200 mg twice daily.  8. Coumadin 2.5 mg daily or as directed.  9. Keflex 500 mg 3 times daily.  10.Lasix 40 mg daily for 1 week.  11.K-Dur 20 mEq daily for 1 week.   FOLLOWUP:  Followup will include PT/INR to be drawn by the home nurse on  January 04, 2008.  Additionally, appointment to see Dr. Diona Browner on January 06, 2008 at 12 noon, Dr. Cornelius Moras on February 01, 2008 at 2:45.   FINAL DIAGNOSES:  As previously dictated.  Also, right lower extremity  cellulitis and wound infection from endoscopic vein harvest site.      Rowe Clack, P.A.-C.      Salvatore Decent. Cornelius Moras, M.D.  Electronically Signed    WEG/MEDQ  D:  04/06/2008  T:  04/07/2008  Job:  [id number]   cc:   Doreen Beam, MD  Bevelyn Buckles. Bensimhon, MD  C. Lesia Sago, M.D.

## 2010-12-17 NOTE — Progress Notes (Signed)
This note is appended to the patient's office visit from April of this year. At that time the patient was clinically stable. She has a history of CABG and bioprosthetic aortic valve replacement in 2009. She is not on Coumadin. Surgical clearance was requested based on recent communication received from Dr. Lequita Halt with plan for elective knee replacement in early September. At this point I do not anticipate any further cardiac testing prior to the patient's planned surgery, presuming she continues to do well without active angina or heart failure symptoms. We can certainly see her back in the office prior to anticipated surgery if her clinical status changes. She should otherwise continue medical therapy.

## 2011-02-22 ENCOUNTER — Encounter (HOSPITAL_COMMUNITY): Payer: Medicare Other

## 2011-02-22 ENCOUNTER — Other Ambulatory Visit: Payer: Self-pay | Admitting: Orthopedic Surgery

## 2011-02-22 ENCOUNTER — Ambulatory Visit (HOSPITAL_COMMUNITY)
Admission: RE | Admit: 2011-02-22 | Discharge: 2011-02-22 | Disposition: A | Discharge status: Home / Self Care | Payer: Medicare Other | Source: Ambulatory Visit | Attending: Orthopedic Surgery | Admitting: Orthopedic Surgery

## 2011-02-22 ENCOUNTER — Other Ambulatory Visit (HOSPITAL_COMMUNITY): Payer: Self-pay | Admitting: Orthopedic Surgery

## 2011-02-22 DIAGNOSIS — Z951 Presence of aortocoronary bypass graft: Secondary | ICD-10-CM | POA: Insufficient documentation

## 2011-02-22 DIAGNOSIS — I10 Essential (primary) hypertension: Secondary | ICD-10-CM | POA: Insufficient documentation

## 2011-02-22 DIAGNOSIS — M1710 Unilateral primary osteoarthritis, unspecified knee: Secondary | ICD-10-CM | POA: Insufficient documentation

## 2011-02-22 DIAGNOSIS — Z01812 Encounter for preprocedural laboratory examination: Secondary | ICD-10-CM | POA: Insufficient documentation

## 2011-02-22 DIAGNOSIS — Z01811 Encounter for preprocedural respiratory examination: Secondary | ICD-10-CM

## 2011-02-22 DIAGNOSIS — Z01818 Encounter for other preprocedural examination: Secondary | ICD-10-CM | POA: Insufficient documentation

## 2011-02-22 LAB — COMPREHENSIVE METABOLIC PANEL
ALT: 9 U/L (ref 0–35)
AST: 20 U/L (ref 0–37)
Alkaline Phosphatase: 87 U/L (ref 39–117)
CO2: 28 mEq/L (ref 19–32)
Calcium: 9.3 mg/dL (ref 8.4–10.5)
GFR calc Af Amer: >60 mL/min (ref >60)
Glucose, Bld: 96 mg/dL (ref 70–99)
Potassium: 4.1 mEq/L (ref 3.5–5.1)
Sodium: 143 mEq/L (ref 135–145)
Total Protein: 6.9 g/dL (ref 6.0–8.3)

## 2011-02-22 LAB — URINALYSIS, ROUTINE W REFLEX MICROSCOPIC
Glucose, UA: NEGATIVE mg/dL
Hgb urine dipstick: NEGATIVE
Leukocytes, UA: NEGATIVE
Protein, ur: NEGATIVE mg/dL
Specific Gravity, Urine: 1.013 (ref 1.005–1.030)
Urobilinogen, UA: 0.2 mg/dL (ref 0.0–1.0)

## 2011-02-22 LAB — CBC
HCT: 40.9 % (ref 36.0–46.0)
Hemoglobin: 13.3 g/dL (ref 12.0–15.0)
MCH: 34.3 pg — ABNORMAL HIGH (ref 26.0–34.0)
MCHC: 32.5 g/dL (ref 30.0–36.0)
RBC: 3.88 MIL/uL (ref 3.87–5.11)

## 2011-02-27 NOTE — H&P (Signed)
[name], [name]                   ACCOUNT NO.:  [id number]  MEDICAL RECORD NO.:  [id number]  LOCATION:                                 FACILITY:  PHYSICIAN:  Ollen Gross, M.D.    DATE OF BIRTH:  1941-03-31  DATE OF ADMISSION: DATE OF DISCHARGE:                             HISTORY & PHYSICAL   DATE OF ADMISSION:  03/01/2011.  CHIEF COMPLAINT:  Left knee pain.  HISTORY OF PRESENT ILLNESS:  The patient is 70 year old female who has seen by Dr. Lequita Halt, referred over by Dr. Sherril Croon for evaluation of painful knees.  She has had trouble with her knees for many years now, the left is far more symptomatic than the right, but the right is also painful. She is at a point now where she would like to have something done.  She has pain with all activity, not as bad at rest during night, but is starting to limiting what she can and cannot do with her functional limitations, it is felt she would benefit from surgical intervention. She has also been treated conservatively over some time with including injections cortisone and gel shots is felt she would benefit.  She has advanced arthritis with bone-on-bone medial and patellofemoral compartments in the left knee and also in the right knee, but radiograph is more pronounced on the left.  There is no contraindications to the upcoming surgery such as ongoing infection or progressive neurological disease.  She was subsequently  admitted to the hospital.  ALLERGIES: 1. PENICILLIN causes rash, swelling, and fever. 2. She also has reaction to STATINS such as ZOCOR AND CRESTOR.  CURRENT MEDICATIONS: 1. Levoxyl. 2. Metoprolol. 3. Vicodin 7.5. 4. Tricor. 5. Zetia. 6. Furosemide. 7. Potassium. 8. Vitamin D. 9. Methotrexate. 10.Folic acid. 11.Glucosamine. 12.Calcium. 13.Fish oil. 14.Aspirin. 15.Multivitamin.  PAST MEDICAL HISTORY: 1. Aortic stenosis. 2. Hypothyroidism. 3. Hemorrhoids. 4. Psoriasis. 5. Hypertension. 6. Cardiac  murmur.  CHILDHOOD ILLNESSES: 1. Rheumatic fever, scarlet fever, measles, mumps, and rubella. 2. Eczema.  PAST SURGICAL HISTORY:  Thyroidectomy, aortic valve replacement using a bovine valve 2009, bypass surgery 2009, and gallbladder surgery 2011.  FAMILY HISTORY:  Father with heart disease and mother with heart disease.  SOCIAL HISTORY:  Widowed, retired Runner, broadcasting/film/video, quit smoking about 16 years ago.  No alcohol.  She lives alone and does want to look into [facility].  She has six steps entering her home.  She does have a living will.  REVIEW OF SYSTEMS:  GENERAL:  No fevers, chills, or night sweats. NEURO:  No seizures, syncope, or paralysis.  RESPIRATORY:  No shortness of breath, productive cough, or hemoptysis.  CARDIOVASCULAR:  No chest pain or orthopnea.  GI:  She does have constipation.  She has no nausea, vomiting, or diarrhea.  GU:  No dysuria, hematuria, or discharge. MUSCULOSKELETAL:  Knee pain.  PHYSICAL EXAMINATION:  Vital Signs:  Pulse 64, respirations 16, and blood pressure 155/64. General:  70 year old white female, well nourished, well developed, overweight, and no acute distress.  She is alert and cooperative. HEENT:  Normocephalic, atraumatic.  Pupils are round and reactive.  EOMs intact.  She does have upper denture plate. NECK:  Supple.  Carotid bruits bilaterally versus referred from her thoracic cavity. CHEST:  Clear, anterior-posterior chest walls. HEART:  Regular rate and rhythm with a grade 3/6 systolic ejection murmur best heard over aortic and pulmonic points. ABDOMEN:  Round, protruded abdomen.  Bowel sounds present. RECTAL, BREAST, GENITALIA:  Not done, not part of present illness. EXTREMITIES:  Left knee range of motion 5-120, slight varus, moderate crepitus, tender more medial than lateral.  No instability.  Right knee range of motion 5-125.  No deformities.  Moderate crepitus, tender more medial.  She does ambulate with an antalgic  gait.  IMPRESSION:  Osteoarthritis, left knee greater than right knee.  PLAN:  The patient will be admitted to [facility], undergo a left total knee replacement arthroplasty.  Surgery will be performed by Dr. Ollen Gross.  There is no contraindications to the ongoing procedure such as ongoing infection versus or progressive neurological disease.  The patient does want to look into [facility] for inpatient skilled rehab following her surgery.     Alexzandrew L. Julien Girt, P.A.C.   ______________________________ Ollen Gross, M.D.    ALP/MEDQ  D:  02/25/2011  T:  02/25/2011  Job:  [id number]  cc:   Dr. Sherril Croon  Electronically Signed by Patrica Duel P.A.C. on 02/27/2011 10:01:17 AM Electronically Signed by Ollen Gross M.D. on 02/27/2011 10:05:11 AM

## 2011-03-01 ENCOUNTER — Inpatient Hospital Stay (HOSPITAL_COMMUNITY)
Admission: RE | Admit: 2011-03-01 | Discharge: 2011-03-04 | DRG: 470 | Disposition: A | Discharge status: Skilled Nursing Facility | Payer: Medicare Other | Source: Ambulatory Visit | Attending: Orthopedic Surgery | Admitting: Orthopedic Surgery

## 2011-03-01 DIAGNOSIS — Z01812 Encounter for preprocedural laboratory examination: Secondary | ICD-10-CM

## 2011-03-01 DIAGNOSIS — E039 Hypothyroidism, unspecified: Secondary | ICD-10-CM | POA: Diagnosis present

## 2011-03-01 DIAGNOSIS — I10 Essential (primary) hypertension: Secondary | ICD-10-CM | POA: Diagnosis present

## 2011-03-01 DIAGNOSIS — Z954 Presence of other heart-valve replacement: Secondary | ICD-10-CM

## 2011-03-01 DIAGNOSIS — I2510 Atherosclerotic heart disease of native coronary artery without angina pectoris: Secondary | ICD-10-CM | POA: Diagnosis present

## 2011-03-01 DIAGNOSIS — Z951 Presence of aortocoronary bypass graft: Secondary | ICD-10-CM

## 2011-03-01 DIAGNOSIS — D62 Acute posthemorrhagic anemia: Secondary | ICD-10-CM | POA: Diagnosis not present

## 2011-03-01 DIAGNOSIS — M1710 Unilateral primary osteoarthritis, unspecified knee: Principal | ICD-10-CM | POA: Diagnosis present

## 2011-03-01 LAB — TYPE AND SCREEN
ABO/RH(D): O POS
Antibody Screen: NEGATIVE

## 2011-03-01 LAB — ABO/RH: ABO/RH(D): O POS

## 2011-03-02 LAB — CBC
Platelets: 217 10*3/uL (ref 150–400)
RBC: 3.14 MIL/uL — ABNORMAL LOW (ref 3.87–5.11)
RDW: 14.3 % (ref 11.5–15.5)
WBC: 10.7 10*3/uL — ABNORMAL HIGH (ref 4.0–10.5)

## 2011-03-02 LAB — BASIC METABOLIC PANEL
CO2: 32 mEq/L (ref 19–32)
Chloride: 98 mEq/L (ref 96–112)
Creatinine, Ser: 1.08 mg/dL (ref 0.50–1.10)
GFR calc Af Amer: >60 mL/min (ref >60)
Potassium: 3.8 mEq/L (ref 3.5–5.1)
Sodium: 138 mEq/L (ref 135–145)

## 2011-03-03 LAB — CBC
HCT: 31.1 % — ABNORMAL LOW (ref 36.0–46.0)
Hemoglobin: 10.4 g/dL — ABNORMAL LOW (ref 12.0–15.0)
MCV: 104 fL — ABNORMAL HIGH (ref 78.0–100.0)
RBC: 2.99 MIL/uL — ABNORMAL LOW (ref 3.87–5.11)
RDW: 14.2 % (ref 11.5–15.5)
WBC: 14.2 10*3/uL — ABNORMAL HIGH (ref 4.0–10.5)

## 2011-03-03 LAB — BASIC METABOLIC PANEL
BUN: 10 mg/dL (ref 6–23)
CO2: 32 mEq/L (ref 19–32)
Chloride: 97 mEq/L (ref 96–112)
GFR calc Af Amer: >60 mL/min (ref >60)
Potassium: 3.8 mEq/L (ref 3.5–5.1)

## 2011-03-03 NOTE — Op Note (Signed)
[name], [name]                   ACCOUNT NO.:  [id number]  MEDICAL RECORD NO.:  [id number]  LOCATION:  1616                         FACILITY:  [facility]  PHYSICIAN:  Ollen Gross, M.D.    DATE OF BIRTH:  1941/03/25  DATE OF PROCEDURE:  03/01/2011 DATE OF DISCHARGE:                              OPERATIVE REPORT   PREOPERATIVE DIAGNOSIS:  Osteoarthritis of the left knee.  POSTOPERATIVE DIAGNOSIS:  Osteoarthritis of the left knee.  PROCEDURE:  Left total knee arthroplasty.  SURGEON:  Ollen Gross, M.D.  ASSISTANT:  Alexzandrew L. Perkins, P.A.C.  ANESTHESIA:  Spinal.  ESTIMATED BLOOD LOSS:  Minimal.  DRAIN:  Hemovac x1.  TOURNIQUET TIME:  38 minutes at 300 mmHg.  COMPLICATIONS:  None.  CONDITION:  Stable to recovery.  BRIEF CLINICAL NOTE:  [name] is a 70 year old female with advanced end- stage arthritis of both knees, left more symptomatic than the right. She has bone-on-bone arthritis.  She has intractable pain and dysfunction.  She has failed nonoperative management including analgesics and injections.  She presents now for a left total knee arthroplasty.  There are no contraindications to this procedure.  PROCEDURE IN DETAIL:  After successful administration of spinal anesthetic, a tourniquet was placed high on her left thigh and her left lower extremity, was prepped and draped in usual sterile fashion. Extremities wrapped in Esmarch, knee flexed, and tourniquet inflated to 300 mmHg.  Midline incision was made with a 10 blade through subcutaneous tissue to the level of the extensor mechanism.  A fresh blade was used make a medial parapatellar arthrotomy.  Soft tissue on the proximal medial tibia was subperiosteally elevated to the joint line with the knife and the semimembranosus bursa with a Cobb elevator.  Soft tissue laterally was elevated with attention being paid to avoiding the patellar tendon on tibial tubercle.  Patella was everted, knee flexed to 90  degrees, and ACL and PCL removed.  Drill was used to create a starting hole in the distal femur.  The canal was thoroughly irrigated. A 5-degrees left valgus alignment guide was placed and the distal femoral cutting block was pinned to remove 10 mm off the distal femur. Resection was made with an oscillating saw.  The tibia was subluxed forward and the menisci were removed. Extramedullary tibial alignment guide was placed, referencing proximally at the medial aspect of the tibial tubercle and distally along the second metatarsal axis and tibial crest.  The block was pinned to remove 2 mm off the more deficient medial side.  There was a fairly big defect medially __________ .  Size 2.5 was the most appropriate tibial component and that was prepared with a modular drill and keel punch for the size 2.5.  Femoral sizing guide was placed, size 3 is most appropriate.  Rotation was marked at the epicondylar axis.  Size 3 cutting block was placed and the anterior, posterior, and chamfer cuts were made.  Intercondylar block was placed and that cut was made.  Trial size 3 posterior stabilized femur was placed.  A 10 mm posterior stabilized rotating platform insert trial was placed.  With the 10, there was a little bit of  __________ varus-valgus and with a 12.5, which allowed for full extension with excellent varus-valgus and the anterior-posterior balance throughout full range of motion.  The patella was everted and the thickness measured to be 22 mm.  Freehand resection was taken to 12 mm, 35 template was placed, lug holes were drilled, trial patella was placed and it tracked normally.  Osteophytes were removed off the posterior femur with the trial in place.  All trials were removed and the cut bone surfaces are prepared with pulsatile lavage.  Cement was mixed and once ready for implantation, the size 2.5 mobile bearing tibial tray, size three posterior stabilized femur and 35 patella were  cemented into place.  Patella was held with a clamp.  Trial 12.5 mm insert was placed. Knee held in full extension and all extruded cement removed.  Cement fully hardened, and the permanent 12.5 mm posterior stabilized rotating platform insert was placed into the tibial tray.  The wound was copiously irrigated with saline solution and the arthrotomy closed over Hemovac drain with interrupted #1 PDS.  Flexion against gravity was about 125 degrees and patella tracks normally.  Tourniquet was released for a total tourniquet time of 38 minutes.  Subcu was closed with interrupted 2-0 Vicryl and subcuticular running 4-0 Monocryl.  Catheter for the Marcaine pain pump was placed and pumps initiated.  Incisions cleaned and dried, and Steri-Strips and bulky sterile dressing were applied.  She was then placed into a knee immobilizer, awakened and transported to recovery in stable condition.     Ollen Gross, M.D.     FA/MEDQ  D:  03/01/2011  T:  03/01/2011  Job:  [id number]  Electronically Signed by Ollen Gross M.D. on 03/03/2011 12:20:42 PM

## 2011-03-04 LAB — CBC
HCT: 28.2 % — ABNORMAL LOW (ref 36.0–46.0)
Hemoglobin: 9.4 g/dL — ABNORMAL LOW (ref 12.0–15.0)
MCHC: 33.3 g/dL (ref 30.0–36.0)
MCV: 104.1 fL — ABNORMAL HIGH (ref 78.0–100.0)

## 2011-03-13 NOTE — Discharge Summary (Signed)
[name], [name]                   ACCOUNT NO.:  [id number]  MEDICAL RECORD NO.:  [id number]  LOCATION:  1616                         FACILITY:  [facility]  PHYSICIAN:  Ollen Gross, M.D.    DATE OF BIRTH:  07-28-1940  DATE OF ADMISSION:  03/01/2011 DATE OF DISCHARGE:  03/04/2011                        DISCHARGE SUMMARY - REFERRING   __________  ADMITTING DIAGNOSES: 1. Osteoarthritis, left knee. 2. Aortic stenosis. 3. Hypothyroidism. 4. Hemorrhoids. 5. Psoriasis. 6. Hypertension. 7. Cardiac murmur.  DISCHARGE DIAGNOSES: 1. Osteoarthritis, left knee, status post left total knee replacement     arthroplasty. 2. Postop acute blood loss anemia, did not require transfusion. 3. Aortic stenosis. 4. Hypothyroidism. 5. Hemorrhoids. 6. Psoriasis. 7. Hypertension. 8. Cardiac murmur.  PROCEDURE:  March 01, 2011, left total knee.  SURGEON:  Dr. Lequita Halt.  ASSISTANT:  Alexzandrew Perkins, PA-C.  Spinal anesthesia.  Tourniquet time, 38 minutes.  CONSULTS:  None.  BRIEF HISTORY:  [name] is a 70 year old female with advanced arthritis of both knees, left more symptomatic than right, bone-on-bone arthritis, intractable pain and dysfunction, failed nonoperative management, and now presents for total knee arthroplasty.  LABORATORY DATA:  Preop CBC showed hemoglobin 13.3, hematocrit 40.9, white cell count 9.9, platelets 266, PT/INR 13 and 0.96 with PTT of 29. Chem panel on admission, elevated BUN of 24.  Remaining Chem panel all within normal limits.  Preop UA, negative.  Blood group type O positive. Nasal swabs were negative.  Staphylococcus aureus negative for MRSA. Serial CBCs were followed.  Hemoglobin dropped down to 10.9, then 10.4. Last hemoglobin and hematocrit 9.4 and 28.2.  Serial BMETs were followed for 48 hours.  Electrolytes all remained within normal limits.  EKG dated, September 26, 2010, normal sinus rhythm, normal EKG, it was faxed copy.  HOSPITAL COURSE:  The  patient was admitted to [facility], taken to OR, underwent above-stated procedure without complication.  The patient tolerated the procedure well,  later  transferred to recovery room to orthopedic floor, started on p.o. and IV analgesic pain control following surgery, doing fairly well on the morning of day #1.  Hemovac drain was pulled, started getting up out of bed.  She knew she wanted to look into skilled facility, so we got social work involved.  By day #2, dressing was changed.  Incision looked good.  Hemoglobin was stable at 10.  She was progressing with her pain control.  We discontinued the PCA and weaned over to p.o. medications.  She continued to progress well, walking about 10 to 15 feet on day #1 and about 20 feet on day #2.  By the morning of day #3, she was seen in rounds.  Hemoglobin is 9.4.  She is asymptomatic with this.  Incision looked good.  We are awaiting on a bed and it was noted the bed became available at [facility]  She was doing well and the patient is stable and discharged over that time.  DISCHARGE PLAN:  The patient was transferred over to [facility].  DISCHARGE DIAGNOSES:  Please see above.  DISCHARGE MEDICATIONS: 1. Xarelto 10 mg p.o. daily for 3 weeks and discontinue the Xarelto. 2. Potassium  20 mEq p.o. daily. 3. Lasix 40 mg p.o. daily. 4. Zetia 10 mg p.o. q.h.s. 5. TriCor 145 mg p.o. q.h.s. 6. Metoprolol XL 50 mg p.o. daily. 7. Synthroid 88 mcg p.o. daily. 8. Robaxin 500 mg p.o. q.6 h. to 8 h. p.r.n. spasm. 9. Restoril 15 mg to 30 mg p.o. q.h.s. p.r.n. sleep. 10.Oxycodone IR 5 mg to 10 mg p.o. every 4 hours as needed for     moderate-to-severe pain. 11.Tylenol 325 mg 1 or 2 every 4 to 6 hours, new for mild pain,     temperature, or headache. 12.MiraLax 17 g powder in 8 ounces of water daily p.r.n. constipation.  DIET:  Heart healthy diet.  ACTIVITY:  She is weightbearing as tolerated,  total knee protocol.   PT and OT began training ambulation, ADLs, range of motion, and strengthening exercises.  Please note the patient may start showering once she arrives at [facility], do not submerge incision under water though.  Follow up 2 weeks from surgery.  Please contact the office at [phone] and arrange appointment, followup care of this patient.  DISPOSITION:  [facility].  CONDITION ON DISCHARGE:  Improved.     Alexzandrew L. Julien Girt, P.A.C.   ______________________________ Ollen Gross, M.D.    ALP/MEDQ  D:  03/04/2011  T:  03/04/2011  Job:  [id number]  cc:   [facility].  Electronically Signed by Patrica Duel P.A.C. on 03/07/2011 10:50:55 AM Electronically Signed by Ollen Gross M.D. on 03/13/2011 10:08:04 AM

## 2011-03-20 NOTE — Op Note (Signed)
  NAME:  CORTNEE, STEINMILLER                   ACCOUNT NO.:  [id number]  MEDICAL RECORD NO.:  [id number]  LOCATION:                               FACILITY:  [facility]  PHYSICIAN:  Ollen Gross, M.D.    DATE OF BIRTH:  07-Aug-1940  DATE OF PROCEDURE: DATE OF DISCHARGE:                              OPERATIVE REPORT   ADDENDUM: There was medical necessity to have a surgical assistant for this procedure in order to perform it in a safe and expeditious manner. Surgical assistant was necessary for retraction of ligaments and vital neurovascular structures to protect them during the procedure.  Surgical assistant was also necessary for proper positioning of the limb to allow for anatomic placement of the prosthetic device.     Ollen Gross, M.D.     FA/MEDQ  D:  03/13/2011  T:  03/14/2011  Job:  [id number]  Electronically Signed by Ollen Gross M.D. on 03/20/2011 10:30:30 AM

## 2011-03-21 LAB — PROTIME-INR
INR: 1
INR: 1.5
INR: 2.4 — ABNORMAL HIGH
INR: 2.5 — ABNORMAL HIGH
INR: 2.6 — ABNORMAL HIGH
Prothrombin Time: 13.9
Prothrombin Time: 18.6 — ABNORMAL HIGH
Prothrombin Time: 27.1 — ABNORMAL HIGH
Prothrombin Time: 27.5 — ABNORMAL HIGH
Prothrombin Time: 28.7 — ABNORMAL HIGH

## 2011-03-21 LAB — POCT I-STAT 3, ART BLOOD GAS (G3+)
Acid-Base Excess: 1
Acid-base deficit: 2
Bicarbonate: 22.6
Bicarbonate: 24.5 — ABNORMAL HIGH
Bicarbonate: 26.4 — ABNORMAL HIGH
Bicarbonate: 26.5 — ABNORMAL HIGH
Bicarbonate: 29.1 — ABNORMAL HIGH
O2 Saturation: 100
O2 Saturation: 100
O2 Saturation: 100
O2 Saturation: 93
O2 Saturation: 96
O2 Saturation: 99
Operator id: 3342
TCO2: 24
TCO2: 27
TCO2: 28
TCO2: 28
TCO2: 29
TCO2: 31
pCO2 arterial: 40.3
pCO2 arterial: 42
pCO2 arterial: 44.3
pCO2 arterial: 46.6 — ABNORMAL HIGH
pCO2 arterial: 47.6 — ABNORMAL HIGH
pCO2 arterial: 47.8 — ABNORMAL HIGH
pCO2 arterial: 59
pH, Arterial: 7.269 — ABNORMAL LOW
pH, Arterial: 7.285 — ABNORMAL LOW
pH, Arterial: 7.393
pH, Arterial: 7.409 — ABNORMAL HIGH
pH, Arterial: 7.425 — ABNORMAL HIGH
pO2, Arterial: 271 — ABNORMAL HIGH
pO2, Arterial: 393 — ABNORMAL HIGH
pO2, Arterial: 81
pO2, Arterial: 81
pO2, Arterial: 92

## 2011-03-21 LAB — BASIC METABOLIC PANEL
BUN: 20
BUN: 23
BUN: 24 — ABNORMAL HIGH
BUN: 29 — ABNORMAL HIGH
CO2: 24
CO2: 26
CO2: 30
CO2: 31
CO2: 36 — ABNORMAL HIGH
Calcium: 7.2 — ABNORMAL LOW
Calcium: 7.9 — ABNORMAL LOW
Calcium: 8.2 — ABNORMAL LOW
Calcium: 8.5
Calcium: 8.8
Calcium: 9.2
Chloride: 101
Chloride: 104
Chloride: 108
Chloride: 91 — ABNORMAL LOW
Chloride: 97
Creatinine, Ser: 1.28 — ABNORMAL HIGH
Creatinine, Ser: 1.29 — ABNORMAL HIGH
Creatinine, Ser: 1.35 — ABNORMAL HIGH
Creatinine, Ser: 1.38 — ABNORMAL HIGH
Creatinine, Ser: 1.9 — ABNORMAL HIGH
Creatinine, Ser: 2.53 — ABNORMAL HIGH
GFR calc Af Amer: 23 — ABNORMAL LOW
GFR calc Af Amer: 32 — ABNORMAL LOW
GFR calc Af Amer: 39 — ABNORMAL LOW
GFR calc Af Amer: 47 — ABNORMAL LOW
GFR calc Af Amer: 55 — ABNORMAL LOW
GFR calc non Af Amer: 32 — ABNORMAL LOW
Glucose, Bld: 126 — ABNORMAL HIGH
Glucose, Bld: 99
Potassium: 4.4
Sodium: 140
Sodium: 142

## 2011-03-21 LAB — POCT I-STAT 4, (NA,K, GLUC, HGB,HCT)
Glucose, Bld: 100 — ABNORMAL HIGH
Glucose, Bld: 114 — ABNORMAL HIGH
Glucose, Bld: 126 — ABNORMAL HIGH
Glucose, Bld: 134 — ABNORMAL HIGH
HCT: 20 — ABNORMAL LOW
HCT: 23 — ABNORMAL LOW
HCT: 23 — ABNORMAL LOW
HCT: 26 — ABNORMAL LOW
Hemoglobin: 7.8 — CL
Hemoglobin: 7.8 — CL
Hemoglobin: 8.8 — ABNORMAL LOW
Hemoglobin: 9.9 — ABNORMAL LOW
Operator id: 199901
Operator id: 3342
Operator id: 3342
Operator id: 3342
Potassium: 4.3
Sodium: 132 — ABNORMAL LOW
Sodium: 133 — ABNORMAL LOW
Sodium: 136
Sodium: 138
Sodium: 139

## 2011-03-21 LAB — TYPE AND SCREEN: Antibody Screen: NEGATIVE

## 2011-03-21 LAB — CBC
Hemoglobin: 8 — ABNORMAL LOW
Hemoglobin: 8.9 — ABNORMAL LOW
Hemoglobin: 9.7 — ABNORMAL LOW
Hemoglobin: 9.8 — ABNORMAL LOW
MCHC: 33.6
MCHC: 33.7
MCHC: 33.7
MCHC: 33.9
MCHC: 34.4
MCV: 95.8
MCV: 98.2
Platelets: 137 — ABNORMAL LOW
Platelets: 167
Platelets: 226
Platelets: 259
RBC: 2.4 — ABNORMAL LOW
RBC: 2.64 — ABNORMAL LOW
RBC: 2.9 — ABNORMAL LOW
RBC: 2.97 — ABNORMAL LOW
RBC: 3.03 — ABNORMAL LOW
RDW: 18.4 — ABNORMAL HIGH
RDW: 18.5 — ABNORMAL HIGH
WBC: 10.4
WBC: 12.7 — ABNORMAL HIGH
WBC: 14.4 — ABNORMAL HIGH
WBC: 9

## 2011-03-21 LAB — LIPID PANEL
Cholesterol: 158
LDL Cholesterol: 92

## 2011-03-21 LAB — COMPREHENSIVE METABOLIC PANEL
AST: 20
Albumin: 3.1 — ABNORMAL LOW
Alkaline Phosphatase: 43
Chloride: 105
Creatinine, Ser: 1.36 — ABNORMAL HIGH
GFR calc Af Amer: 47 — ABNORMAL LOW
Potassium: 4.3
Sodium: 141
Total Bilirubin: 0.6

## 2011-03-21 LAB — URINALYSIS, ROUTINE W REFLEX MICROSCOPIC
Bilirubin Urine: NEGATIVE
Glucose, UA: NEGATIVE
Glucose, UA: NEGATIVE
Ketones, ur: NEGATIVE
pH: 5.5
pH: 7

## 2011-03-21 LAB — APTT
aPTT: 26
aPTT: 35

## 2011-03-21 LAB — ABO/RH: ABO/RH(D): O POS

## 2011-03-21 LAB — BLOOD GAS, ARTERIAL
Drawn by: 28337
FIO2: 0.21
O2 Saturation: 91.8
pCO2 arterial: 46.8 — ABNORMAL HIGH
pO2, Arterial: 63 — ABNORMAL LOW

## 2011-03-21 LAB — CREATININE, SERUM
Creatinine, Ser: 1.17
GFR calc Af Amer: 56 — ABNORMAL LOW
GFR calc non Af Amer: 46 — ABNORMAL LOW

## 2011-03-21 LAB — POCT I-STAT 3, VENOUS BLOOD GAS (G3P V)
Acid-Base Excess: 3 — ABNORMAL HIGH
Bicarbonate: 29.3 — ABNORMAL HIGH
O2 Saturation: 61
O2 Saturation: 63
TCO2: 32
pCO2, Ven: 41.7 — ABNORMAL LOW
pH, Ven: 7.371 — ABNORMAL HIGH
pO2, Ven: 34

## 2011-03-21 LAB — MAGNESIUM: Magnesium: 3.1 — ABNORMAL HIGH

## 2011-03-21 LAB — HEMOGLOBIN AND HEMATOCRIT, BLOOD: Hemoglobin: 7.8 — CL

## 2011-03-21 LAB — B-NATRIURETIC PEPTIDE (CONVERTED LAB): Pro B Natriuretic peptide (BNP): 460 — ABNORMAL HIGH

## 2011-03-21 LAB — TSH: TSH: 3.73

## 2011-03-25 LAB — PROTIME-INR
INR: 1.3
Prothrombin Time: 16.8 — ABNORMAL HIGH

## 2011-03-25 LAB — WOUND CULTURE: Culture: NO GROWTH

## 2011-03-25 LAB — COMPREHENSIVE METABOLIC PANEL
AST: 19
CO2: 31
Calcium: 8.8
Creatinine, Ser: 1.09
GFR calc Af Amer: >60
GFR calc non Af Amer: 50 — ABNORMAL LOW

## 2011-03-25 LAB — TISSUE CULTURE

## 2011-03-25 LAB — CBC
MCHC: 33.1
MCV: 92.3
RBC: 4.04

## 2011-03-25 LAB — URINALYSIS, ROUTINE W REFLEX MICROSCOPIC
Ketones, ur: NEGATIVE
Nitrite: NEGATIVE
Protein, ur: NEGATIVE
Urobilinogen, UA: 1
pH: 7

## 2011-03-25 LAB — APTT: aPTT: 29

## 2011-03-25 LAB — ANAEROBIC CULTURE

## 2011-05-02 ENCOUNTER — Encounter: Payer: Self-pay | Admitting: Cardiology

## 2011-05-03 ENCOUNTER — Encounter: Payer: Self-pay | Admitting: Cardiology

## 2011-05-03 ENCOUNTER — Ambulatory Visit (INDEPENDENT_AMBULATORY_CARE_PROVIDER_SITE_OTHER): Payer: Medicare Other | Admitting: Cardiology

## 2011-05-03 DIAGNOSIS — E785 Hyperlipidemia, unspecified: Secondary | ICD-10-CM

## 2011-05-03 DIAGNOSIS — I4891 Unspecified atrial fibrillation: Secondary | ICD-10-CM

## 2011-05-03 DIAGNOSIS — I10 Essential (primary) hypertension: Secondary | ICD-10-CM

## 2011-05-03 MED ORDER — ATORVASTATIN CALCIUM 20 MG PO TABS
20.0000 mg | ORAL_TABLET | Freq: Every day | ORAL | Status: DC
Start: 2011-05-03 — End: 2011-09-02

## 2011-05-03 NOTE — Assessment & Plan Note (Signed)
Patient has agreed to be rechallenged with a statin. She reports prior intolerance to both Crestor and Lipitor. However, we agree to resume Lipitor at 20 mg daily, with a surveillance labs in 12 weeks. Most recent profile indicated LDL 106, which was reviewed with the patient. We discussed the importance of trying to achieve a target 70, or less, if feasible. Of note, patient is to continue on fenofibrate and fish oil.

## 2011-05-03 NOTE — Assessment & Plan Note (Signed)
No evidence of recurrence by history, and by most recent EKG at time of last OV. Continue monitoring clinically. Patient to remain on full dose aspirin.

## 2011-05-03 NOTE — Patient Instructions (Signed)
   Begin Lipitor 20mg  every evening  Cholesterol labs in 12 weeks - will receive reminder in mail (FLP / LFT) Your physician wants you to follow up in: 6 months.  You will receive a reminder letter in the mail one-two months in advance.  If you don't receive a letter, please call our office to schedule the follow up appointment

## 2011-05-03 NOTE — Progress Notes (Signed)
Cc: routine visit  HPI: Patient underwent successful left TKR, by Dr. Ollen Gross, at [facility], this past September. She reports no noted cardiac complications, and has been doing extremely well during her rehabilitation.  Clinically, she denies any interim development of symptoms suggestive of UAP or CHF. Given her history of transient postop atrial fibrillation, she denies any tachycardia palpitations.  Most recent lipid profile, January' 12: LDL 106.  PMH: reviewed and listed in Problem List in electronic Records (and see below)  Allergies/SH/FH: available in Electronic Records for review  Current Outpatient Prescriptions  Medication Sig Dispense Refill  . aspirin 325 MG tablet Take 325 mg by mouth daily.        . calcium-vitamin D (OSCAL 500/200 D-3) 500-200 MG-UNIT per tablet Take 1 tablet by mouth 2 (two) times daily.        . ergocalciferol (VITAMIN D2) 50000 UNITS capsule Take 50,000 Units by mouth once a week.        . ezetimibe (ZETIA) 10 MG tablet Take 10 mg by mouth daily.        . fenofibrate (TRICOR) 145 MG tablet Take 145 mg by mouth daily.        . folic acid (FOLVITE) 1 MG tablet Take 1 mg by mouth daily.        . furosemide (LASIX) 40 MG tablet Take 40 mg by mouth daily.        . Glucosamine-Chondroit-Vit C-Mn (GLUCOSAMINE-CHONDROITIN) CAPS Take by mouth 2 (two) times daily.        Marland Kitchen HYDROcodone-acetaminophen (VICODIN) 5-500 MG per tablet Take 1 tablet by mouth 2 (two) times daily.        Marland Kitchen levothyroxine (SYNTHROID, LEVOTHROID) 88 MCG tablet Take 88 mcg by mouth daily.        . methotrexate (RHEUMATREX) 2.5 MG tablet Take 7.5 mg by mouth once a week. Caution:Chemotherapy. Protect from light.       . metoprolol (TOPROL XL) 50 MG 24 hr tablet Take 50 mg by mouth daily.        . Multiple Vitamins-Minerals (CENTRUM SILVER PO) Take by mouth daily.        . Omega-3 Fatty Acids (FISH OIL) 1000 MG CAPS Take by mouth 2 (two) times daily.        . potassium chloride SA  (K-DUR,KLOR-CON) 20 MEQ tablet Take 20 mEq by mouth daily.          ROS: no nausea, vomiting; no fever, chills; no melena, hematochezia; no claudication  PHYSICAL EXAM:  There were no vitals taken for this visit.  GENERAL: 70 year old female, obese, sitting upright; NAD HEENT: NCAT, PERRLA, EOMI; sclera clear; no xanthelasma NECK: palpable bilateral carotid pulses, no bruits; unable to assess JVD, secondary to neck girth LUNGS: CTA bilaterally CARDIAC: RRR (S1, S2); soft, 1-2/6 systolic ejection murmur; no rubs or gallops ABDOMEN: Protuberant EXTREMETIES: intact distal pulses; trace peripheral edema SKIN: warm/dry; no obvious rash/lesions MUSCULOSKELETAL: no joint deformity NEURO: no focal deficit; NL affect   EKG:     ASSESSMENT & PLAN:

## 2011-05-03 NOTE — Assessment & Plan Note (Signed)
Quiescent on current medication regimen. No current indication for further workup.

## 2011-05-03 NOTE — Assessment & Plan Note (Signed)
Stable on current medication regimen 

## 2011-05-24 ENCOUNTER — Other Ambulatory Visit: Payer: Self-pay | Admitting: Orthopedic Surgery

## 2011-08-19 NOTE — H&P (Signed)
[name] DOB: 03/01/1941   Chief Complaint: right knee pain  History of Present Illness The patient is a 71 year old female who comes in today for a preoperative History and Physical. The patient is scheduled for a right total knee arthroplasty to be performed by Dr. Gus Rankin. Aluisio, MD at [facility] on September 16, 2011. The patient has a history of bilateral knee pain due to osteoarthritis. She has had cortisone injections in the past with diminishing effectiveness with each one. She has pain with all activities presently. She had the left knee replaced in September of last year with great results and pain relief. She presentes today for history and physical to have the right knee replaced for decrease in pain and increase in function. PCP: Dr. Sherril Croon Cardio: Dr. Diona Browner    Past Medical History S/P Left total knee arthroplasty (V43.65) Osteoarthrosis NOS, lower leg (715.96). 11/22/2010 Hypertension Hypercholesterolemia Osteoarthritis Rheumatic Fever. childhood CABG Hemorrhoids Hypothyroidism Scarlet Fever. childhood Measles. childhood Mumps. childhood Rubella. childhood Psoriasis  Allergies Statins. high doses of lipitor Penicillins   Family History Heart Disease. mother Osteoarthritis. mother Rheumatoid Arthritis. mother Father. deceased 29. CHF Mother. deceased age 77, MI Daughter 1. deceased age 5, cancer   Social History Tobacco use. Former smoker. former smoker; smoke(d) 1/2 pack(s) per day Alcohol use. never consumed alcohol Children. 0 Current work status. retired Financial planner (Currently). no Drug/Alcohol Rehab (Previously). no Exercise. Exercises rarely; does running / walking Illicit drug use. no Living situation. live alone Marital status. widowed Number of flights of stairs before winded. greater than 5 Pain Contract. no Caregiver. after surgery: [facility] Advance Directives.  Living will, healthcare POA   Medication History Levoxyl ( Tablet, Oral daily) Active. Multi Vitamin/Minerals ( Oral) Specific dose unknown - Active. Fish Oil Specific dose unknown - Active. Calcium "900" w/D ( Oral) Specific dose unknown - Active. Glucosamine Chondr 500 Complex ( Oral) Specific dose unknown - Active. Folic Acid (1MG  Tablet, Oral) Active. Tricor (145MG  Tablet, Oral daily) Active. Furosemide (40MG  Tablet, Oral daily) Active. Vitamin D (Cholecalciferol) ( Oral) Specific dose unknown - Active. Potassium Chloride CR ( Tablet ER, Oral) Active. Methotrexate (Anti-Rheumatic) (2.5MG  Tablet, Oral three times a week) Active. Metoprolol Succinate (50MG  Tablet ER 24HR, Oral daily) Active. Atorvastatin Calcium (20MG  Tablet, Oral daily) Active. Aspirin EC (325MG  Tablet DR, Oral daily) Active.   Past Surgical History Thyroidectomy; Total. 1970 Total Knee Replacement - Left Cholecystectomy. 2011 Coronary Artery Bypass Graft. 2 vessels 2009 Total Knee Replacement. left 2012 Valve Replacement. replaced: aortic 2009   Review of Systems General:Not Present- Chills, Fever, Night Sweats, Appetite Loss, Fatigue, Feeling sick, Weight Gain, Weight Loss and Memory Loss. Skin:Present- Rash and Psoriasis. Not Present- Hives, Itching, Skin Color Changes, Ulcer, Change in Hair or Nails, Eczema and Lesions. HEENT:Not Present- Sensitivity to light, Hearing problems, Tinnitus, Nose Bleed, Headache, Double Vision, Visual Loss, Hearing Loss, Ringing in the Ears and Dentures. Neck:Not Present- Swollen Glands and Neck Mass. Respiratory:Not Present- Shortness of breath with exertion, Shortness of breath at rest, Allergies, Coughing up blood, Snoring, Chronic Cough, Bloody sputum and Dyspnea. Cardiovascular:Not Present- Shortness of Breath, Chest Pain, Swelling of Extremities, Racing/skipping heartbeats, Leg Cramps, Difficulty Breathing Lying Down, Murmur, Swelling and  Palpitations. Gastrointestinal:Present- Constipation and Hemorrhoids. Not Present- Bloody Stool, Heartburn, Abdominal Pain, Vomiting, Nausea, Diarrhea, Difficulty Swallowing, Incontinence of Stool, Jaundice and Loss of appetitie. Female Genitourinary:Not Present- Blood in Urine, Urinary frequency, Menstrual Irregularities, Weak urinary stream, Discharge, Flank Pain, Frequency, Incontinence, Nocturia, Painful  Urination, Urgency, Urinary Retention and Urinating at Night. Musculoskeletal:Present- Joint Pain. Not Present- Muscle Weakness, Muscle Pain, Joint Stiffness, Joint Swelling, Back Pain, Morning Stiffness and Spasms. Neurological:Not Present- Tingling, Numbness, Burning, Tremor, Headaches, Dizziness, Blackout spells, Paralysis, Difficulty with balance and Weakness. Psychiatric:Not Present- Anxiety, Depression, Memory Loss and Insomnia. Endocrine:Not Present- Cold Intolerance, Heat Intolerance, Excessive hunger and Excessive Thirst. Hematology:Not Present- Abnormal Bleeding, Anemia, Blood Clots and Easy Bruising.   Vitals 08/19/2011 2:29 PM Weight: 254 lb Height: 63 in Body Surface Area: 2.26 m Body Mass Index: 44.99 kg/m Pulse: 72 (Regular) Resp.: 18 (Unlabored) BP: 118/68 (Sitting, Left Arm, Standard)   Physical Exam General Mental Status - Alert, cooperative and good historian. General Appearance- pleasant. Not in acute distress. Orientation- Oriented X3. Build & Nutrition- Obese and Well developed. Head and Neck Head- normocephalic, atraumatic . NeckGlobal Assessment- supple. no bruit auscultated on the right and no bruit auscultated on the left. Eye Vision- Wears corrective lenses. Pupil- Bilateral- PERRLA. Motion- Bilateral- EOMI. Chest and Lung Exam Auscultation: Breath sounds:- clear at anterior chest wall and - clear at posterior chest wall. Adventitious sounds:- No Adventitious sounds. Cardiovascular Auscultation:Rhythm- Regular  rate and rhythm. Heart Sounds- S1 WNL and S2 WNL. Murmurs & Other Heart Sounds:Auscultation of the heart reveals - No Murmurs. Abdomen Palpation/Percussion:Tenderness- Abdomen is non-tender to palpation. Abdomen is soft and protuberant. Auscultation:Auscultation of the abdomen reveals - Bowel sounds normal. Female Genitourinary Not done, not pertinent to present illness Peripheral Vascular Upper Extremity: Palpation:- Pulses bilaterally normal. Lower Extremity: Palpation:- Pulses bilaterally normal. Neurologic Examination of related systems reveals - normal muscle strength and tone in all extremities. Neurologic evaluation reveals - normal sensation and upper and lower extremity deep tendon reflexes intact bilaterally . Musculoskeletal Examination of the left knee looks great. There is no swelling about the left knee. Range of motion is zero to 120 degrees. There is no tenderness or instability. Examination of the right knee, no effusion. Moderate varus deformity on the right. Range of motion is 10 to 120 degrees. There is crepitus on range of motion. She is tender medial greater than lateral with no instability noted.   RADIOGRAPHS: X-rays right knee show bone-on-bone medial and patellofemoral with tibial subluxation.  Assessment & Plan Osteoarthrosis NOS, lower leg (715.96) Right total knee arthroplasty   Dimitri Ped, PA-C

## 2011-09-02 ENCOUNTER — Encounter (HOSPITAL_COMMUNITY): Payer: Self-pay | Admitting: Pharmacy Technician

## 2011-09-06 ENCOUNTER — Encounter (HOSPITAL_COMMUNITY): Payer: Self-pay

## 2011-09-06 ENCOUNTER — Encounter (HOSPITAL_COMMUNITY)
Admission: RE | Admit: 2011-09-06 | Discharge: 2011-09-06 | Disposition: A | Discharge status: Home / Self Care | Payer: Medicare Other | Source: Ambulatory Visit | Attending: Orthopedic Surgery | Admitting: Orthopedic Surgery

## 2011-09-06 HISTORY — DX: Psoriasis, unspecified: L40.9

## 2011-09-06 LAB — COMPREHENSIVE METABOLIC PANEL
ALT: 11 U/L (ref 0–35)
AST: 24 U/L (ref 0–37)
Albumin: 3.7 g/dL (ref 3.5–5.2)
CO2: 30 mEq/L (ref 19–32)
Calcium: 9.2 mg/dL (ref 8.4–10.5)
Creatinine, Ser: 1.07 mg/dL (ref 0.50–1.10)
Sodium: 143 mEq/L (ref 135–145)
Total Protein: 6.8 g/dL (ref 6.0–8.3)

## 2011-09-06 LAB — URINALYSIS, ROUTINE W REFLEX MICROSCOPIC
Glucose, UA: NEGATIVE mg/dL
Leukocytes, UA: NEGATIVE
Nitrite: NEGATIVE
Specific Gravity, Urine: 1.011 (ref 1.005–1.030)
pH: 6 (ref 5.0–8.0)

## 2011-09-06 LAB — CBC
MCH: 33.3 pg (ref 26.0–34.0)
MCHC: 32.4 g/dL (ref 30.0–36.0)
Platelets: 275 10*3/uL (ref 150–400)
RBC: 3.93 MIL/uL (ref 3.87–5.11)
RDW: 14.6 % (ref 11.5–15.5)

## 2011-09-06 LAB — SURGICAL PCR SCREEN
MRSA, PCR: NEGATIVE
Staphylococcus aureus: NEGATIVE

## 2011-09-06 LAB — PROTIME-INR: INR: 0.95 (ref 0.00–1.49)

## 2011-09-06 MED ORDER — CHLORHEXIDINE GLUCONATE 4 % EX LIQD: 60.0000 mL | Freq: Once | CUTANEOUS | Status: DC

## 2011-09-06 NOTE — Patient Instructions (Signed)
20 [name]  09/06/2011   Your procedure is scheduled on:  09/16/11  Report to SHORT STAY DEPT  at 5:15 AM.  Call this number if you have problems the morning of surgery: [phone]   Remember:   Do not eat food or drink liquids AFTER MIDNIGHT  Take these medicines the morning of surgery with A SIP OF WATER: TOPROL / SYNTHROID   Do not wear jewelry, make-up or nail polish.  Do not wear lotions, powders, or perfumes.   Do not shave legs or underarms 48 hrs. before surgery (men may shave face)  Do not bring valuables to the hospital.  Contacts, dentures or bridgework may not be worn into surgery.  Leave suitcase in the car. After surgery it may be brought to your room.  For patients admitted to the hospital, checkout time is 11:00 AM the day of discharge.   Patients discharged the day of surgery will not be allowed to drive home.  Name and phone number of your driver:   Special Instructions:   Please read over the following fact sheets that you were given: MRSA  Information / Incentive Spirometer               SHOWER WITH BETASEPT THE NIGHT BEFORE SURGERY AND THE MORNING OF SURGERY

## 2011-09-15 NOTE — Anesthesia Preprocedure Evaluation (Addendum)
Anesthesia Evaluation  Patient identified by MRN, date of birth, ID band Patient awake    Reviewed: Allergy & Precautions, H&P , NPO status , Patient's Chart, lab work & pertinent test results, reviewed documented beta blocker date and time   Airway Mallampati: II TM Distance: >3 FB Neck ROM: full    Dental  (+) Edentulous Upper and Edentulous Lower   Pulmonary neg pulmonary ROS,  breath sounds clear to auscultation  Pulmonary exam normal       Cardiovascular hypertension, Pt. on medications and Pt. on home beta blockers + CAD and + CABG + dysrhythmias Atrial Fibrillation + Valvular Problems/Murmurs AS Rhythm:regular Rate:Normal  AVR.   Neuro/Psych negative neurological ROS  negative psych ROS   GI/Hepatic negative GI ROS, Neg liver ROS,   Endo/Other  negative endocrine ROSHypothyroidism Morbid obesity  Renal/GU negative Renal ROS  negative genitourinary   Musculoskeletal   Abdominal (+) + obese,   Peds  Hematology negative hematology ROS (+)   Anesthesia Other Findings   Reproductive/Obstetrics negative OB ROS                          Anesthesia Physical Anesthesia Plan  ASA: III  Anesthesia Plan: Spinal   Post-op Pain Management:    Induction:   Airway Management Planned: Simple Face Mask  Additional Equipment:   Intra-op Plan:   Post-operative Plan:   Informed Consent: I have reviewed the patients History and Physical, chart, labs and discussed the procedure including the risks, benefits and alternatives for the proposed anesthesia with the patient or authorized representative who has indicated his/her understanding and acceptance.   Dental Advisory Given  Plan Discussed with: CRNA and Surgeon  Anesthesia Plan Comments:         Anesthesia Quick Evaluation

## 2011-09-16 ENCOUNTER — Encounter (HOSPITAL_COMMUNITY): Payer: Self-pay | Admitting: Anesthesiology

## 2011-09-16 ENCOUNTER — Inpatient Hospital Stay (HOSPITAL_COMMUNITY): Payer: Medicare Other | Admitting: Anesthesiology

## 2011-09-16 ENCOUNTER — Encounter (HOSPITAL_COMMUNITY): Payer: Self-pay | Admitting: *Deleted

## 2011-09-16 ENCOUNTER — Encounter (HOSPITAL_COMMUNITY)
Admission: RE | Disposition: A | Discharge status: Skilled Nursing Facility | Payer: Self-pay | Source: Ambulatory Visit | Attending: Orthopedic Surgery

## 2011-09-16 ENCOUNTER — Inpatient Hospital Stay (HOSPITAL_COMMUNITY)
Admission: RE | Admit: 2011-09-16 | Discharge: 2011-09-19 | DRG: 470 | Disposition: A | Discharge status: Skilled Nursing Facility | Payer: Medicare Other | Source: Ambulatory Visit | Attending: Orthopedic Surgery | Admitting: Orthopedic Surgery

## 2011-09-16 DIAGNOSIS — D62 Acute posthemorrhagic anemia: Secondary | ICD-10-CM

## 2011-09-16 DIAGNOSIS — I2510 Atherosclerotic heart disease of native coronary artery without angina pectoris: Secondary | ICD-10-CM | POA: Diagnosis present

## 2011-09-16 DIAGNOSIS — I10 Essential (primary) hypertension: Secondary | ICD-10-CM | POA: Diagnosis present

## 2011-09-16 DIAGNOSIS — Z951 Presence of aortocoronary bypass graft: Secondary | ICD-10-CM

## 2011-09-16 DIAGNOSIS — M179 Osteoarthritis of knee, unspecified: Secondary | ICD-10-CM | POA: Diagnosis present

## 2011-09-16 DIAGNOSIS — I4891 Unspecified atrial fibrillation: Secondary | ICD-10-CM | POA: Diagnosis present

## 2011-09-16 DIAGNOSIS — Z96659 Presence of unspecified artificial knee joint: Secondary | ICD-10-CM

## 2011-09-16 DIAGNOSIS — Z01812 Encounter for preprocedural laboratory examination: Secondary | ICD-10-CM

## 2011-09-16 DIAGNOSIS — E039 Hypothyroidism, unspecified: Secondary | ICD-10-CM | POA: Diagnosis present

## 2011-09-16 DIAGNOSIS — M1710 Unilateral primary osteoarthritis, unspecified knee: Principal | ICD-10-CM | POA: Diagnosis present

## 2011-09-16 DIAGNOSIS — Z6841 Body Mass Index (BMI) 40.0 and over, adult: Secondary | ICD-10-CM

## 2011-09-16 HISTORY — PX: TOTAL KNEE ARTHROPLASTY: SHX125

## 2011-09-16 LAB — TYPE AND SCREEN

## 2011-09-16 SURGERY — ARTHROPLASTY, KNEE, TOTAL
Anesthesia: Spinal | Site: Knee | Laterality: Right | Wound class: Clean

## 2011-09-16 MED ORDER — METOCLOPRAMIDE HCL 5 MG/ML IJ SOLN: 5.0000 mg | Freq: Three times a day (TID) | INTRAMUSCULAR | Status: DC | PRN

## 2011-09-16 MED ORDER — ONDANSETRON HCL 4 MG/2ML IJ SOLN: 4.0000 mg | Freq: Four times a day (QID) | INTRAMUSCULAR | Status: DC | PRN

## 2011-09-16 MED ORDER — TEMAZEPAM 15 MG PO CAPS: 15.0000 mg | ORAL_CAPSULE | Freq: Every evening | ORAL | Status: DC | PRN

## 2011-09-16 MED ORDER — BUPIVACAINE 0.25 % ON-Q PUMP SINGLE CATH 300ML: 300.0000 mL | INJECTION | Status: DC

## 2011-09-16 MED ORDER — ACETAMINOPHEN 10 MG/ML IV SOLN
1000.0000 mg | Freq: Four times a day (QID) | INTRAVENOUS | Status: AC
  Administered 2011-09-16 – 2011-09-17 (×4): 1000 mg via INTRAVENOUS
  Filled 2011-09-16 (×4): qty 100

## 2011-09-16 MED ORDER — ACETAMINOPHEN 650 MG RE SUPP: 650.0000 mg | Freq: Four times a day (QID) | RECTAL | Status: DC | PRN

## 2011-09-16 MED ORDER — MENTHOL 3 MG MT LOZG
1.0000 | LOZENGE | OROMUCOSAL | Status: DC | PRN
  Filled 2011-09-16: qty 9

## 2011-09-16 MED ORDER — VANCOMYCIN HCL IN DEXTROSE 1-5 GM/200ML-% IV SOLN
1000.0000 mg | Freq: Two times a day (BID) | INTRAVENOUS | Status: AC
  Administered 2011-09-16: 1000 mg via INTRAVENOUS
  Filled 2011-09-16: qty 200

## 2011-09-16 MED ORDER — ONDANSETRON HCL 4 MG/2ML IJ SOLN
INTRAMUSCULAR | Status: DC | PRN
  Administered 2011-09-16: 4 mg via INTRAVENOUS

## 2011-09-16 MED ORDER — DIPHENHYDRAMINE HCL 12.5 MG/5ML PO ELIX: 12.5000 mg | ORAL_SOLUTION | Freq: Four times a day (QID) | ORAL | Status: DC | PRN

## 2011-09-16 MED ORDER — POLYETHYLENE GLYCOL 3350 17 G PO PACK
17.0000 g | PACK | Freq: Every day | ORAL | Status: DC | PRN
  Administered 2011-09-17: 17 g via ORAL

## 2011-09-16 MED ORDER — FENTANYL CITRATE 0.05 MG/ML IJ SOLN
INTRAMUSCULAR | Status: DC | PRN
  Administered 2011-09-16: 50 ug via INTRAVENOUS

## 2011-09-16 MED ORDER — HYDROMORPHONE HCL PF 1 MG/ML IJ SOLN: 0.2500 mg | INTRAMUSCULAR | Status: DC | PRN

## 2011-09-16 MED ORDER — VANCOMYCIN HCL 1000 MG IV SOLR
1000.0000 mg | INTRAVENOUS | Status: DC | PRN
  Administered 2011-09-16: 1000 mg via INTRAVENOUS

## 2011-09-16 MED ORDER — LACTATED RINGERS IV SOLN: INTRAVENOUS | Status: DC

## 2011-09-16 MED ORDER — SODIUM CHLORIDE 0.9 % IV SOLN: INTRAVENOUS | Status: DC

## 2011-09-16 MED ORDER — DEXAMETHASONE SODIUM PHOSPHATE 10 MG/ML IJ SOLN
10.0000 mg | Freq: Once | INTRAMUSCULAR | Status: AC
  Administered 2011-09-16: 10 mg via INTRAVENOUS

## 2011-09-16 MED ORDER — CEFAZOLIN SODIUM-DEXTROSE 2-3 GM-% IV SOLR: 2.0000 g | INTRAVENOUS | Status: DC

## 2011-09-16 MED ORDER — OXYCODONE HCL 5 MG PO TABS
5.0000 mg | ORAL_TABLET | ORAL | Status: DC | PRN
  Administered 2011-09-16: 5 mg via ORAL
  Administered 2011-09-17 (×2): 10 mg via ORAL
  Administered 2011-09-17: 5 mg via ORAL
  Administered 2011-09-17 – 2011-09-19 (×6): 10 mg via ORAL
  Filled 2011-09-16 (×2): qty 1
  Filled 2011-09-16 (×8): qty 2

## 2011-09-16 MED ORDER — PROPOFOL 10 MG/ML IV BOLUS
INTRAVENOUS | Status: DC | PRN
  Administered 2011-09-16: 25 mg via INTRAVENOUS

## 2011-09-16 MED ORDER — SODIUM CHLORIDE 0.9 % IR SOLN
Status: DC | PRN
  Administered 2011-09-16: 1000 mL

## 2011-09-16 MED ORDER — DOCUSATE SODIUM 100 MG PO CAPS
100.0000 mg | ORAL_CAPSULE | Freq: Two times a day (BID) | ORAL | Status: DC
  Administered 2011-09-16 – 2011-09-19 (×5): 100 mg via ORAL
  Filled 2011-09-16 (×7): qty 1

## 2011-09-16 MED ORDER — NALOXONE HCL 0.4 MG/ML IJ SOLN: 0.4000 mg | INTRAMUSCULAR | Status: DC | PRN

## 2011-09-16 MED ORDER — BUPIVACAINE ON-Q PAIN PUMP (FOR ORDER SET NO CHG)
INJECTION | Status: DC
  Filled 2011-09-16: qty 1

## 2011-09-16 MED ORDER — FENOFIBRATE 160 MG PO TABS
160.0000 mg | ORAL_TABLET | Freq: Every day | ORAL | Status: DC
  Administered 2011-09-17 – 2011-09-19 (×3): 160 mg via ORAL
  Filled 2011-09-16 (×3): qty 1

## 2011-09-16 MED ORDER — POTASSIUM CHLORIDE CRYS ER 20 MEQ PO TBCR
20.0000 meq | EXTENDED_RELEASE_TABLET | Freq: Every day | ORAL | Status: DC
  Administered 2011-09-17 – 2011-09-19 (×3): 20 meq via ORAL
  Filled 2011-09-16 (×3): qty 1

## 2011-09-16 MED ORDER — TRAMADOL HCL 50 MG PO TABS: 50.0000 mg | ORAL_TABLET | Freq: Four times a day (QID) | ORAL | Status: DC | PRN

## 2011-09-16 MED ORDER — BUPIVACAINE IN DEXTROSE 0.75-8.25 % IT SOLN
INTRATHECAL | Status: DC | PRN
  Administered 2011-09-16: 13 mg via INTRATHECAL

## 2011-09-16 MED ORDER — METOCLOPRAMIDE HCL 10 MG PO TABS: 5.0000 mg | ORAL_TABLET | Freq: Three times a day (TID) | ORAL | Status: DC | PRN

## 2011-09-16 MED ORDER — BUPIVACAINE 0.25 % ON-Q PUMP SINGLE CATH 300ML
INJECTION | Status: DC | PRN
  Administered 2011-09-16: 300 mL

## 2011-09-16 MED ORDER — METOPROLOL SUCCINATE ER 50 MG PO TB24
50.0000 mg | ORAL_TABLET | Freq: Every day | ORAL | Status: DC
  Administered 2011-09-17 – 2011-09-19 (×3): 50 mg via ORAL
  Filled 2011-09-16 (×3): qty 1

## 2011-09-16 MED ORDER — POLYETHYLENE GLYCOL 3350 17 G PO PACK
17.0000 g | PACK | Freq: Every day | ORAL | Status: DC
  Administered 2011-09-17 – 2011-09-19 (×3): 17 g via ORAL
  Filled 2011-09-16 (×3): qty 1

## 2011-09-16 MED ORDER — ACETAMINOPHEN 325 MG PO TABS: 650.0000 mg | ORAL_TABLET | Freq: Four times a day (QID) | ORAL | Status: DC | PRN

## 2011-09-16 MED ORDER — METHOCARBAMOL 100 MG/ML IJ SOLN
500.0000 mg | Freq: Four times a day (QID) | INTRAVENOUS | Status: DC | PRN
  Administered 2011-09-16: 500 mg via INTRAVENOUS
  Filled 2011-09-16: qty 5

## 2011-09-16 MED ORDER — ZOLPIDEM TARTRATE 5 MG PO TABS: 5.0000 mg | ORAL_TABLET | Freq: Every evening | ORAL | Status: DC | PRN

## 2011-09-16 MED ORDER — FLEET ENEMA 7-19 GM/118ML RE ENEM: 1.0000 | ENEMA | Freq: Once | RECTAL | Status: AC | PRN

## 2011-09-16 MED ORDER — DIPHENHYDRAMINE HCL 50 MG/ML IJ SOLN: 12.5000 mg | Freq: Four times a day (QID) | INTRAMUSCULAR | Status: DC | PRN

## 2011-09-16 MED ORDER — LEVOTHYROXINE SODIUM 88 MCG PO TABS
88.0000 ug | ORAL_TABLET | Freq: Every day | ORAL | Status: DC
  Administered 2011-09-17 – 2011-09-19 (×3): 88 ug via ORAL
  Filled 2011-09-16 (×3): qty 1

## 2011-09-16 MED ORDER — DEXTROSE-NACL 5-0.45 % IV SOLN
INTRAVENOUS | Status: DC
  Administered 2011-09-16 – 2011-09-17 (×2): via INTRAVENOUS

## 2011-09-16 MED ORDER — MIDAZOLAM HCL 5 MG/5ML IJ SOLN
INTRAMUSCULAR | Status: DC | PRN
  Administered 2011-09-16: 2 mg via INTRAVENOUS

## 2011-09-16 MED ORDER — PHENOL 1.4 % MT LIQD
1.0000 | OROMUCOSAL | Status: DC | PRN
  Filled 2011-09-16: qty 177

## 2011-09-16 MED ORDER — ONDANSETRON HCL 4 MG PO TABS: 4.0000 mg | ORAL_TABLET | Freq: Four times a day (QID) | ORAL | Status: DC | PRN

## 2011-09-16 MED ORDER — PROPOFOL 10 MG/ML IV EMUL
INTRAVENOUS | Status: DC | PRN
  Administered 2011-09-16: 120 ug/kg/min via INTRAVENOUS

## 2011-09-16 MED ORDER — ACETAMINOPHEN 10 MG/ML IV SOLN: 1000.0000 mg | Freq: Once | INTRAVENOUS | Status: DC

## 2011-09-16 MED ORDER — BISACODYL 10 MG RE SUPP: 10.0000 mg | Freq: Every day | RECTAL | Status: DC | PRN

## 2011-09-16 MED ORDER — RIVAROXABAN 10 MG PO TABS
10.0000 mg | ORAL_TABLET | Freq: Every day | ORAL | Status: DC
  Administered 2011-09-17 – 2011-09-19 (×3): 10 mg via ORAL
  Filled 2011-09-16 (×3): qty 1

## 2011-09-16 MED ORDER — METHOCARBAMOL 500 MG PO TABS
500.0000 mg | ORAL_TABLET | Freq: Four times a day (QID) | ORAL | Status: DC | PRN
  Administered 2011-09-17 – 2011-09-18 (×2): 500 mg via ORAL
  Filled 2011-09-16 (×2): qty 1

## 2011-09-16 MED ORDER — LACTATED RINGERS IV SOLN
INTRAVENOUS | Status: DC | PRN
  Administered 2011-09-16 (×4): via INTRAVENOUS

## 2011-09-16 MED ORDER — SODIUM CHLORIDE 0.9 % IJ SOLN: 9.0000 mL | INTRAMUSCULAR | Status: DC | PRN

## 2011-09-16 MED ORDER — FUROSEMIDE 40 MG PO TABS
40.0000 mg | ORAL_TABLET | Freq: Every day | ORAL | Status: DC
  Administered 2011-09-17 – 2011-09-19 (×3): 40 mg via ORAL
  Filled 2011-09-16 (×3): qty 1

## 2011-09-16 MED ORDER — DIPHENHYDRAMINE HCL 12.5 MG/5ML PO ELIX: 12.5000 mg | ORAL_SOLUTION | ORAL | Status: DC | PRN

## 2011-09-16 MED ORDER — MORPHINE SULFATE (PF) 1 MG/ML IV SOLN
INTRAVENOUS | Status: DC
  Administered 2011-09-16: 1 mg via INTRAVENOUS
  Administered 2011-09-16: 10:00:00 via INTRAVENOUS
  Administered 2011-09-16: 8 mg via INTRAVENOUS
  Administered 2011-09-16: 3 mg via INTRAVENOUS
  Administered 2011-09-16: 19 mg via INTRAVENOUS
  Administered 2011-09-17: 6 mg via INTRAVENOUS
  Filled 2011-09-16: qty 25

## 2011-09-16 SURGICAL SUPPLY — 53 items
BAG SPEC THK2 15X12 ZIP CLS (MISCELLANEOUS) ×1
BAG ZIPLOCK 12X15 (MISCELLANEOUS) ×2 IMPLANT
BANDAGE ELASTIC 6 VELCRO ST LF (GAUZE/BANDAGES/DRESSINGS) ×2 IMPLANT
BANDAGE ESMARK 6X9 LF (GAUZE/BANDAGES/DRESSINGS) ×1 IMPLANT
BLADE SAG 18X100X1.27 (BLADE) ×2 IMPLANT
BLADE SAW SGTL 11.0X1.19X90.0M (BLADE) ×2 IMPLANT
BNDG CMPR 9X6 STRL LF SNTH (GAUZE/BANDAGES/DRESSINGS) ×1
BNDG ESMARK 6X9 LF (GAUZE/BANDAGES/DRESSINGS) ×2
BOWL SMART MIX CTS (DISPOSABLE) ×2 IMPLANT
CATH KIT ON-Q SILVERSOAK 5 (CATHETERS) ×1 IMPLANT
CATH KIT ON-Q SILVERSOAK 5IN (CATHETERS) ×2 IMPLANT
CEMENT HV SMART SET (Cement) ×3 IMPLANT
CLOTH BEACON ORANGE TIMEOUT ST (SAFETY) ×2 IMPLANT
CUFF TOURN SGL QUICK 34 (TOURNIQUET CUFF) ×2
CUFF TRNQT CYL 34X4X40X1 (TOURNIQUET CUFF) ×1 IMPLANT
DRAPE EXTREMITY T 121X128X90 (DRAPE) ×2 IMPLANT
DRAPE POUCH INSTRU U-SHP 10X18 (DRAPES) ×2 IMPLANT
DRAPE U-SHAPE 47X51 STRL (DRAPES) ×2 IMPLANT
DRSG ADAPTIC 3X8 NADH LF (GAUZE/BANDAGES/DRESSINGS) ×2 IMPLANT
DURAPREP 26ML APPLICATOR (WOUND CARE) ×2 IMPLANT
ELECT REM PT RETURN 9FT ADLT (ELECTROSURGICAL) ×2
ELECTRODE REM PT RTRN 9FT ADLT (ELECTROSURGICAL) ×1 IMPLANT
EVACUATOR 1/8 PVC DRAIN (DRAIN) ×2 IMPLANT
FACESHIELD LNG OPTICON STERILE (SAFETY) ×10 IMPLANT
GAUZE SPONGE 4X4 16PLY XRAY LF (GAUZE/BANDAGES/DRESSINGS) ×1 IMPLANT
GLOVE BIO SURGEON STRL SZ7.5 (GLOVE) ×2 IMPLANT
GLOVE BIO SURGEON STRL SZ8 (GLOVE) ×2 IMPLANT
GLOVE BIOGEL PI IND STRL 8 (GLOVE) ×2 IMPLANT
GLOVE BIOGEL PI INDICATOR 8 (GLOVE) ×2
GOWN STRL NON-REIN LRG LVL3 (GOWN DISPOSABLE) ×2 IMPLANT
GOWN STRL REIN XL XLG (GOWN DISPOSABLE) ×2 IMPLANT
HANDPIECE INTERPULSE COAX TIP (DISPOSABLE) ×2
IMMOBILIZER KNEE 20 (SOFTGOODS) ×2
IMMOBILIZER KNEE 20 THIGH 36 (SOFTGOODS) ×1 IMPLANT
KIT BASIN OR (CUSTOM PROCEDURE TRAY) ×2 IMPLANT
MANIFOLD NEPTUNE II (INSTRUMENTS) ×2 IMPLANT
NS IRRIG 1000ML POUR BTL (IV SOLUTION) ×2 IMPLANT
PACK TOTAL JOINT (CUSTOM PROCEDURE TRAY) ×2 IMPLANT
PAD ABD 7.5X8 STRL (GAUZE/BANDAGES/DRESSINGS) ×2 IMPLANT
PADDING CAST COTTON 6X4 STRL (CAST SUPPLIES) ×5 IMPLANT
POSITIONER SURGICAL ARM (MISCELLANEOUS) ×2 IMPLANT
SET HNDPC FAN SPRY TIP SCT (DISPOSABLE) ×1 IMPLANT
SPONGE GAUZE 4X4 12PLY (GAUZE/BANDAGES/DRESSINGS) ×2 IMPLANT
STRIP CLOSURE SKIN 1/2X4 (GAUZE/BANDAGES/DRESSINGS) ×4 IMPLANT
SUCTION FRAZIER 12FR DISP (SUCTIONS) ×2 IMPLANT
SUT MNCRL AB 4-0 PS2 18 (SUTURE) ×2 IMPLANT
SUT PDS AB 1 CT1 27 (SUTURE) ×6 IMPLANT
SUT VIC AB 2-0 CT1 27 (SUTURE) ×6
SUT VIC AB 2-0 CT1 TAPERPNT 27 (SUTURE) ×3 IMPLANT
TOWEL OR 17X26 10 PK STRL BLUE (TOWEL DISPOSABLE) ×4 IMPLANT
TRAY FOLEY CATH 14FRSI W/METER (CATHETERS) ×2 IMPLANT
WATER STERILE IRR 1500ML POUR (IV SOLUTION) ×2 IMPLANT
WRAP KNEE MAXI GEL POST OP (GAUZE/BANDAGES/DRESSINGS) ×4 IMPLANT

## 2011-09-16 NOTE — Transfer of Care (Signed)
Immediate Anesthesia Transfer of Care Note  Patient: [name]  Procedure(s) Performed: Procedure(s) (LRB): TOTAL KNEE ARTHROPLASTY (Right)  Patient Location: PACU  Anesthesia Type: Regional and Spinal  Level of Consciousness: awake, sedated and patient cooperative  Airway & Oxygen Therapy: Patient Spontanous Breathing and Patient connected to face mask oxygen  Post-op Assessment: Report given to PACU RN and Post -op Vital signs reviewed and stable  Post vital signs: Reviewed and stable  Complications: No apparent anesthesia complications

## 2011-09-16 NOTE — Progress Notes (Signed)
Utilization review completed.  

## 2011-09-16 NOTE — Anesthesia Postprocedure Evaluation (Signed)
  Anesthesia Post-op Note  Patient: [name]  Procedure(s) Performed: Procedure(s) (LRB): TOTAL KNEE ARTHROPLASTY (Right)  Patient Location: PACU  Anesthesia Type: Spinal  Level of Consciousness: awake and alert   Airway and Oxygen Therapy: Patient Spontanous Breathing  Post-op Pain: mild  Post-op Assessment: Post-op Vital signs reviewed, Patient's Cardiovascular Status Stable, Respiratory Function Stable, Patent Airway and No signs of Nausea or vomiting  Post-op Vital Signs: stable  Complications: No apparent anesthesia complications

## 2011-09-16 NOTE — Anesthesia Procedure Notes (Signed)
Spinal  Patient location during procedure: OR Start time: 09/16/2011 7:25 AM End time: 09/16/2011 7:30 AM Staffing Anesthesiologist: Ronelle Nigh L Performed by: anesthesiologist  Preanesthetic Checklist Completed: patient identified, site marked, surgical consent, pre-op evaluation, timeout performed, IV checked, risks and benefits discussed and monitors and equipment checked Spinal Block Patient position: sitting Prep: Betadine Patient monitoring: heart rate, continuous pulse ox and blood pressure Location: L3-4 Injection technique: single-shot Needle Needle type: Spinocan  Needle gauge: 22 G Needle length: 9 cm Assessment Sensory level: T6 Additional Notes Expiration date of kit checked and confirmed. Patient tolerated procedure well, without complications.

## 2011-09-16 NOTE — Op Note (Signed)
Pre-operative diagnosis- Osteoarthritis  Right knee(s)  Post-operative diagnosis- Osteoarthritis Right knee(s)  Procedure-  Right  Total Knee Arthroplasty  Surgeon- Gus Rankin. Kyira Volkert, MD  Assistant- Dimitri Ped, PA-C   Anesthesia-  Spinal  EBL-* No blood loss amount entered *   Drains Hemovac  Tourniquet time-  Total Tourniquet Time Documented: Thigh (Right) - 38 minutes   Complications- None  Condition-PACU - hemodynamically stable.   Brief Clinical Note  [name] is a 71 y.o. year old female with end stage OA of her right knee with progressively worsening pain and dysfunction. She has constant pain, with activity and at rest and significant functional deficits with difficulties even with ADLs. She has had extensive non-op management including analgesics, injections of cortisone and viscosupplements, and home exercise program, but remains in significant pain with significant dysfunction.Radiographs show bone on bone arthritis medial and patellofemoral with tibial subchondral sclerosis. She presents now for left Total Knee Arthroplasty.    Procedure in detail---   The patient is brought into the operating room and positioned supine on the operating table. After successful administration of  Spinal,   a tourniquet is placed high on the  Right thigh(s) and the lower extremity is prepped and draped in the usual sterile fashion. Time out is performed by the operating team and then the  Right lower extremity is wrapped in Esmarch, knee flexed and the tourniquet inflated to 300 mmHg.       A midline incision is made with a ten blade through the subcutaneous tissue to the level of the extensor mechanism. A fresh blade is used to make a medial parapatellar arthrotomy. Soft tissue over the proximal medial tibia is subperiosteally elevated to the joint line with a knife and into the semimembranosus bursa with a Cobb elevator. Soft tissue over the proximal lateral tibia is elevated with  attention being paid to avoiding the patellar tendon on the tibial tubercle. The patella is everted, knee flexed 90 degrees and the ACL and PCL are removed. Findings are bone on bone medial and patellofemoral with large osteophyte formation medially.        The drill is used to create a starting hole in the distal femur and the canal is thoroughly irrigated with sterile saline to remove the fatty contents. The 5 degree Right  valgus alignment guide is placed into the femoral canal and the distal femoral cutting block is pinned to remove 11 mm off the distal femur. Resection is made with an oscillating saw.      The tibia is subluxed forward and the menisci are removed. The extramedullary alignment guide is placed referencing proximally at the medial aspect of the tibial tubercle and distally along the second metatarsal axis and tibial crest. The block is pinned to remove 2mm off the more deficient medial  side. Resection is made with an oscillating saw. Size 3is the most appropriate size for the tibia and the proximal tibia is prepared with the modular drill and keel punch for that size.      The femoral sizing guide is placed and size 3 is most appropriate. Rotation is marked off the epicondylar axis and confirmed by creating a rectangular flexion gap at 90 degrees. The size 3 cutting block is pinned in this rotation and the anterior, posterior and chamfer cuts are made with the oscillating saw. The intercondylar block is then placed and that cut is made.      Trial size 3 tibial component, trial size 3 posterior  stabilized femur and a 12.5  mm posterior stabilized rotating platform insert trial is placed. Full extension is achieved with excellent varus/valgus and anterior/posterior balance throughout full range of motion. The patella is everted and thickness measured to be 23  mm. Free hand resection is taken to 13 mm, a 38 template is placed, lug holes are drilled, trial patella is placed, and it tracks  normally. Osteophytes are removed off the posterior femur with the trial in place. All trials are removed and the cut bone surfaces prepared with pulsatile lavage. Cement is mixed and once ready for implantation, the size 3 tibial implant, size  3 posterior stabilized femoral component, and the size 38 patella are cemented in place and the patella is held with the clamp. The trial insert is placed and the knee held in full extension. All extruded cement is removed and once the cement is hard the permanent 12.5 mm posterior stabilized rotating platform insert is placed into the tibial tray.      The wound is copiously irrigated with saline solution and the extensor mechanism closed over a hemovac drain with #1 PDS suture. The tourniquet is released for a total tourniquet time of 38  minutes. Flexion against gravity is 135 degrees and the patella tracks normally. Subcutaneous tissue is closed with 2.0 vicryl and subcuticular with running 4.0 Monocryl. The catheter for the Marcaine pain pump is placed and the pump is initiated. The incision is cleaned and dried and steri-strips and a bulky sterile dressing are applied. The limb is placed into a knee immobilizer and the patient is awakened and transported to recovery in stable condition.      Please note that a surgical assistant was a medical necessity for this procedure in order to perform it in a safe and expeditious manner. Surgical assistant was necessary to retract the ligaments and vital neurovascular structures to prevent injury to them and also necessary for proper positioning of the limb to allow for anatomic placement of the prosthesis.   Gus Rankin Joene Gelder, MD    09/16/2011, 8:36 AM

## 2011-09-16 NOTE — Plan of Care (Signed)
Problem: Consults Goal: Diagnosis- Total Joint Replacement Right total knee     

## 2011-09-16 NOTE — Interval H&P Note (Signed)
History and Physical Interval Note:  09/16/2011 7:08 AM  [name]  has presented today for surgery, with the diagnosis of Osteoarthritis of the Right Knee  The various methods of treatment have been discussed with the patient and family. After consideration of risks, benefits and other options for treatment, the patient has consented to  Procedure(s) (LRB): TOTAL KNEE ARTHROPLASTY (Right) as a surgical intervention .  The patients' history has been reviewed, patient examined, no change in status, stable for surgery.  I have reviewed the patients' chart and labs.  Questions were answered to the patient's satisfaction.     Loanne Drilling

## 2011-09-17 LAB — BASIC METABOLIC PANEL
BUN: 18 mg/dL (ref 6–23)
Creatinine, Ser: 1.03 mg/dL (ref 0.50–1.10)
GFR calc Af Amer: 62 mL/min — ABNORMAL LOW (ref >90)
GFR calc non Af Amer: 54 mL/min — ABNORMAL LOW (ref >90)

## 2011-09-17 LAB — CBC
HCT: 32.9 % — ABNORMAL LOW (ref 36.0–46.0)
MCH: 33.3 pg (ref 26.0–34.0)
MCHC: 31.9 g/dL (ref 30.0–36.0)
MCV: 104.4 fL — ABNORMAL HIGH (ref 78.0–100.0)
RDW: 14.5 % (ref 11.5–15.5)

## 2011-09-17 MED ORDER — MORPHINE SULFATE 2 MG/ML IJ SOLN: 1.0000 mg | INTRAMUSCULAR | Status: DC | PRN

## 2011-09-17 NOTE — Progress Notes (Signed)
Physical Therapy Treatment Patient Details Name: [name] MRN: [id number] DOB: 24-Sep-1940 Today's Date: 09/17/2011  PT Assessment/Plan  PT - Assessment/Plan PT Plan: Discharge plan remains appropriate PT Frequency: 7X/week Follow Up Recommendations: Skilled nursing facility Equipment Recommended: Defer to next venue PT Goals  Acute Rehab PT Goals PT Goal Formulation: With patient Time For Goal Achievement: 7 days Pt will go Supine/Side to Sit: with min assist PT Goal: Supine/Side to Sit - Progress: Goal set today Pt will go Sit to Supine/Side: with min assist PT Goal: Sit to Supine/Side - Progress: Goal set today Pt will go Sit to Stand: with min assist PT Goal: Sit to Stand - Progress: Goal set today Pt will go Stand to Sit: with min assist PT Goal: Stand to Sit - Progress: Goal set today Pt will Ambulate: 51 - 150 feet;with min assist;with rolling walker PT Goal: Ambulate - Progress: Goal set today  PT Treatment Precautions/Restrictions  Precautions Precautions: Knee Required Braces or Orthoses: Yes Knee Immobilizer: Discontinue once straight leg raise with < 10 degree lag Restrictions Weight Bearing Restrictions: No RLE Weight Bearing: Weight bearing as tolerated Mobility (including Balance) Bed Mobility Sit to Supine: 1: +2 Total assist (pt 60%) Sit to Supine - Details (indicate cue type and reason): cues for sequence, use of L LE to self assist Transfers Sit to Stand: 1: +2 Total assist (pt 60%) Sit to Stand Details (indicate cue type and reason): cues for use of UEs and for LE position Stand to Sit: 1: +2 Total assist (pt 60%) Stand to Sit Details: cues for use of UEs and for LE position Ambulation/Gait Ambulation/Gait Assistance: 1: +2 Total assist Ambulation/Gait Assistance Details (indicate cue type and reason): cues for posture, position from RW and sequence Ambulation Distance (Feet): 4 Feet Assistive device: Rolling walker Gait Pattern: Step-to pattern      Exercise    End of Session PT - End of Session Equipment Utilized During Treatment: Right knee immobilizer Activity Tolerance: Patient limited by fatigue;Patient limited by pain Patient left: in bed;with call bell in reach Nurse Communication: Mobility status for transfers;Mobility status for ambulation General Behavior During Session: [facility] for tasks performed Cognition: [facility] for tasks performed  Ivory Bail 09/17/2011, 3:52 PM

## 2011-09-17 NOTE — Progress Notes (Signed)
Subjective: 1 Day Post-Op Procedure(s) (LRB): TOTAL KNEE ARTHROPLASTY (Right) Patient reports pain as moderate.   Patient seen in rounds with Dr. Lequita Halt. Patient has complaints of rough night with pain. We will start therapy today. Plan is to go Rehab at [facility] after hospital stay.  Objective: Vital signs in last 24 hours: Temp:  [97.3 F (36.3 C)-98 F (36.7 C)] 98 F (36.7 C) (03/26 0628) Pulse Rate:  [58-72] 66  (03/26 0628) Resp:  [8-18] 14  (03/26 0816) BP: (123-169)/(52-78) 126/56 mmHg (03/26 0628) SpO2:  [96 %-100 %] 99 % (03/26 0816) Weight:  [114.76 kg (253 lb)] 114.76 kg (253 lb) (03/25 1030)  Intake/Output from previous day:  Intake/Output Summary (Last 24 hours) at 09/17/11 0841 Last data filed at 09/17/11 0630  Gross per 24 hour  Intake 2658.75 ml  Output   1895 ml  Net 763.75 ml    Intake/Output this shift:    Labs:  Basename 09/17/11 0400  HGB 10.5*    Basename 09/17/11 0400  WBC 11.2*  RBC 3.15*  HCT 32.9*  PLT 209    Basename 09/17/11 0400  NA 137  K 4.5  CL 101  CO2 30  BUN 18  CREATININE 1.03  GLUCOSE 138*  CALCIUM 8.6   No results found for this basename: LABPT:2,INR:2 in the last 72 hours  Exam - Neurovascular intact Sensation intact distally Dressing - clean, dry, no drainage Motor function intact - moving foot and toes well on exam.  Hemovac pulled without difficulty.  Past Medical History  Diagnosis Date  . Coronary atherosclerosis of native coronary artery     Multivessel  . Essential hypertension, benign   . Mixed hyperlipidemia   . Hypothyroidism   . Arthritis   . Aortic stenosis   . Atrial fibrillation     Postoperative, transiently on Coumadin  . Aftercare following joint replacement   . Knee joint replacement by other means   . Acute posthemorrhagic anemia   . Undiagnosed cardiac murmurs   . Personal history of allergy to penicillin   . Gout     HX OF GOUT  . Psoriasis     HX OF      Assessment/Plan: 1 Day Post-Op Procedure(s) (LRB): TOTAL KNEE ARTHROPLASTY (Right) Active Problems:  OA (osteoarthritis) of knee   Advance diet Up with therapy Continue foley due to strict I&O and urinary output monitoring Discharge to SNF  DVT Prophylaxis - Xarelto Protocol Weight-Bearing as tolerated to right leg Keep foley until tomorrow. No vaccines. D/C PCA Morphine, Change to IV push D/C O2 and Pulse OX and try on Room [address]  [name] 09/17/2011, 8:41 AM

## 2011-09-17 NOTE — Progress Notes (Signed)
CSW consulted for d/c planning. Pt plans to have ST rehab at [facility] following hospitalization. SNF contacted and has confirmed d/c plan. CSW will assist with d/c planning to [facility] when pt is stable.   Cori Razor LCSW [phone]

## 2011-09-17 NOTE — Plan of Care (Signed)
Problem: Consults Goal: Diagnosis- Total Joint Replacement Outcome: Completed/Met Date Met:  09/17/11 Primary Total Knee Right

## 2011-09-17 NOTE — Evaluation (Signed)
Physical Therapy Evaluation Patient Details Name: [name] MRN: [id number] DOB: [birth year] Today's Date: 09/17/2011  Problem List:  Patient Active Problem List  Diagnoses  . HYPERLIPIDEMIA-MIXED  . Essential hypertension, benign  . CORONARY ATHEROSCLEROSIS NATIVE CORONARY ARTERY  . AORTIC STENOSIS  . ATRIAL FIBRILLATION  . PSORIASIS  . FATIGUE  . THYROID DISEASE, HX OF  . HEART MURMUR, HX OF  . NEEDLE BIOPSY, LIVER, HX OF  . OA (osteoarthritis) of knee    Past Medical History:  Past Medical History  Diagnosis Date  . Coronary atherosclerosis of native coronary artery     Multivessel  . Essential hypertension, benign   . Mixed hyperlipidemia   . Hypothyroidism   . Arthritis   . Aortic stenosis   . Atrial fibrillation     Postoperative, transiently on Coumadin  . Aftercare following joint replacement   . Knee joint replacement by other means   . Acute posthemorrhagic anemia   . Undiagnosed cardiac murmurs   . Personal history of allergy to penicillin   . Gout     HX OF GOUT  . Psoriasis     HX OF    Past Surgical History:  Past Surgical History  Procedure Date  . Coronary artery bypass graft 2009    LIMA to LAD, SVG to RCA  . Aortic valve replacement 2009    21 mm Edwards Magna pericardial valve  . Cholecystectomy     2011  . Total thyroidectomy 1970  . Joint replacement 2012    L TOTAL KNEE    PT Assessment/Plan/Recommendation PT Assessment Clinical Impression Statement: Pt wtih R TKR presents  with decreased R LE strength/ROM and ltd functional mobilityl PT Recommendation/Assessment: Patient will need skilled PT in the acute care venue PT Problem List: Decreased strength;Decreased activity tolerance;Decreased range of motion;Decreased mobility;Decreased knowledge of use of DME;Decreased knowledge of precautions;Pain;Obesity PT Therapy Diagnosis : Difficulty walking PT Plan PT Frequency: 7X/week PT Treatment/Interventions: DME instruction;Gait  training;Stair training;Functional mobility training;Therapeutic activities;Therapeutic exercise;Patient/family education PT Recommendation Recommendations for Other Services: OT consult Follow Up Recommendations: Skilled nursing facility Equipment Recommended: Defer to next venue PT Goals  Acute Rehab PT Goals PT Goal Formulation: With patient Time For Goal Achievement: 7 days Pt will go Supine/Side to Sit: with min assist PT Goal: Supine/Side to Sit - Progress: Goal set today Pt will go Sit to Supine/Side: with min assist PT Goal: Sit to Supine/Side - Progress: Goal set today Pt will go Sit to Stand: with min assist PT Goal: Sit to Stand - Progress: Goal set today Pt will go Stand to Sit: with min assist PT Goal: Stand to Sit - Progress: Goal set today Pt will Ambulate: 51 - 150 feet;with min assist;with rolling walker PT Goal: Ambulate - Progress: Goal set today  PT Evaluation Precautions/Restrictions  Precautions Precautions: Knee Required Braces or Orthoses: Yes Knee Immobilizer: Discontinue once straight leg raise with < 10 degree lag Restrictions Weight Bearing Restrictions: No RLE Weight Bearing: Weight bearing as tolerated Prior Functioning  Home Living Lives With: Alone Home Adaptive Equipment: Walker - rolling Prior Function Level of Independence: Independent with basic ADLs;Independent with gait;Independent with transfers;Requires assistive device for independence Able to Take Stairs?: Yes Driving: Yes Cognition Cognition Arousal/Alertness: Awake/alert Overall Cognitive Status: Appears within functional limits for tasks assessed Orientation Level: Oriented X4 Sensation/Coordination Coordination Gross Motor Movements are Fluid and Coordinated: Yes Extremity Assessment RUE Assessment RUE Assessment: Within Functional Limits LUE Assessment LUE Assessment: Within Functional Limits RLE Assessment RLE  Assessment: Exceptions to [facility] (AAROM at knee -10 - 30; 2/5  quads) LLE Assessment LLE Assessment: Exceptions to [facility] (4+/5 quads; flex ltd to ~90) Mobility (including Balance) Bed Mobility Bed Mobility: Yes Supine to Sit: 1: +2 Total assist (pt 60%) Supine to Sit Details (indicate cue type and reason): cues for sequence, use of L LE to self assist Transfers Transfers: Yes Sit to Stand: 1: +2 Total assist (pt 60%) Sit to Stand Details (indicate cue type and reason): cues for use of UEs and for LE position Stand to Sit: 1: +2 Total assist ( pt 60%) Stand to Sit Details: cues for use of UEs and for LE position Ambulation/Gait Ambulation/Gait: Yes Ambulation/Gait Assistance: 1: +2 Total assist (pt 70 %) Ambulation/Gait Assistance Details (indicate cue type and reason): cues for posture, position from RW and sequence Ambulation Distance (Feet): 8 Feet Assistive device: Rolling walker Gait Pattern: Step-to pattern    Exercise  Total Joint Exercises Ankle Circles/Pumps: AROM;10 reps;Supine;Both Quad Sets: AROM;10 reps;Supine;Both Heel Slides: AAROM;Right;10 reps;Supine Straight Leg Raises: AAROM;10 reps;Supine;Right End of Session PT - End of Session Equipment Utilized During Treatment: Right knee immobilizer Activity Tolerance: Patient limited by fatigue Patient left: in chair;with call bell in reach Nurse Communication: Mobility status for transfers;Mobility status for ambulation General Behavior During Session: [facility] for tasks performed Cognition: [facility] for tasks performed  Morton Simson 09/17/2011, 12:18 PM

## 2011-09-17 NOTE — Progress Notes (Signed)
CARE MANAGEMENT NOTE 09/17/2011  Patient:  [name], [name]   Account Number:  [id number]  Date Initiated:  09/17/2011  Documentation initiated by:  Colleen Can  Subjective/Objective Assessment:   dx osteoarthritis knee-right; total knee replacemnt     Action/Plan:   SNf rehab   Anticipated DC Date:  09/19/2011   Anticipated DC Plan:  SKILLED NURSING FACILITY  In-house referral  Clinical Social Worker      DC Planning Services  NA      [facility] Choice  NA   Choice offered to / List presented to:  NA   DME arranged  NA      DME agency  NA     HH arranged  NA      HH agency  NA   Status of service:  Completed, signed off Medicare Important Message given?  NA - LOS <3 / Initial given by admissions

## 2011-09-18 LAB — BASIC METABOLIC PANEL
Calcium: 8.3 mg/dL — ABNORMAL LOW (ref 8.4–10.5)
Creatinine, Ser: 0.97 mg/dL (ref 0.50–1.10)
GFR calc Af Amer: 67 mL/min — ABNORMAL LOW (ref >90)
GFR calc non Af Amer: 58 mL/min — ABNORMAL LOW (ref >90)
Sodium: 137 mEq/L (ref 135–145)

## 2011-09-18 LAB — CBC
MCH: 33.2 pg (ref 26.0–34.0)
MCHC: 32 g/dL (ref 30.0–36.0)
MCV: 103.7 fL — ABNORMAL HIGH (ref 78.0–100.0)
Platelets: 183 10*3/uL (ref 150–400)
RBC: 2.71 MIL/uL — ABNORMAL LOW (ref 3.87–5.11)
RDW: 14.7 % (ref 11.5–15.5)

## 2011-09-18 MED ORDER — POLYSACCHARIDE IRON COMPLEX 150 MG PO CAPS
150.0000 mg | ORAL_CAPSULE | Freq: Every day | ORAL | Status: DC
  Administered 2011-09-18 – 2011-09-19 (×2): 150 mg via ORAL
  Filled 2011-09-18 (×2): qty 1

## 2011-09-18 NOTE — Progress Notes (Signed)
OT Screen Order received, chart reviewed. Pt had other knee replaced in 2012 and plans to d/c to snf. Will sign off and defer OT eval to that venue.  Garrel Ridgel, OTR/L  Pager [phone] 09/18/2011

## 2011-09-18 NOTE — Progress Notes (Signed)
Physical Therapy Treatment Patient Details Name: [name] MRN: [id number] DOB: [birth year] Today's Date: 09/18/2011  PT Assessment/Plan  PT - Assessment/Plan Comments on Treatment Session: Pt requires ++ encouragement to progress with ther ex and ambulation. PT Plan: Discharge plan remains appropriate PT Frequency: 7X/week Recommendations for Other Services: OT consult Follow Up Recommendations: Skilled nursing facility Equipment Recommended: Defer to next venue PT Goals  Acute Rehab PT Goals PT Goal Formulation: With patient Time For Goal Achievement: 7 days Pt will go Supine/Side to Sit: with min assist Pt will go Sit to Supine/Side: with min assist PT Goal: Sit to Supine/Side - Progress: Progressing toward goal Pt will go Sit to Stand: with min assist PT Goal: Sit to Stand - Progress: Progressing toward goal Pt will go Stand to Sit: with min assist PT Goal: Stand to Sit - Progress: Progressing toward goal Pt will Ambulate: 51 - 150 feet;with min assist;with rolling walker PT Goal: Ambulate - Progress: Progressing toward goal  PT Treatment Precautions/Restrictions  Precautions Precautions: Knee Required Braces or Orthoses: Yes Knee Immobilizer: Discontinue once straight leg raise with < 10 degree lag Restrictions Weight Bearing Restrictions: No RLE Weight Bearing: Weight bearing as tolerated Mobility (including Balance) Bed Mobility Sit to Supine: 3: Mod assist Sit to Supine - Details (indicate cue type and reason): cues for sequence, use of L LE to self assist Transfers Sit to Stand: 1: +2 Total assist Sit to Stand Details (indicate cue type and reason): cues for use of UEs and for LE position Stand to Sit: 1: +2 Total assist Stand to Sit Details: cues for use of UEs and for LE position Ambulation/Gait Ambulation/Gait Assistance: 1: +2 Total assist Ambulation/Gait Assistance Details (indicate cue type and reason): cues for posture, position from RW and  sequence Ambulation Distance (Feet): 23 Feet Assistive device: Rolling walker Gait Pattern: Step-to pattern    Exercise    End of Session PT - End of Session Equipment Utilized During Treatment: Right knee immobilizer Activity Tolerance: Patient limited by fatigue;Patient limited by pain Patient left: in bed;with call bell in reach Nurse Communication: Mobility status for transfers;Mobility status for ambulation General Behavior During Session: [facility] for tasks performed Cognition: [facility] for tasks performed  Brayan Votaw 09/18/2011, 4:12 PM

## 2011-09-18 NOTE — Clinical Documentation Improvement (Signed)
BMI DOCUMENTATION CLARIFICATION QUERY  THIS DOCUMENT IS NOT A PERMANENT PART OF THE MEDICAL RECORD  TO RESPOND TO THE THIS QUERY, FOLLOW THE INSTRUCTIONS BELOW:  1. If needed, update documentation for the patient's encounter via the notes activity.  2. Access this query again and click edit on the In [employer].  3. After updating, or not, click F2 to complete all highlighted (required) fields concerning your review. Select "additional documentation in the medical record" OR "no additional documentation provided".  4. Click Sign note button.  5. The deficiency will fall out of your In Basket *Please let us know if you are not able to complete this workflow by phone or e-mail (listed below).         09/18/11  Dear Dr. Lequita Halt, [name] Marton Redwood  In an effort to better capture your patient's severity of illness, reflect appropriate length of stay and utilization of resources, a review of the patient medical record has revealed the following indicators.    Based on your clinical judgment, please clarify and document in a progress note and/or discharge summary the clinical condition associated with the following supporting information:  In responding to this query please exercise your independent judgment.  The fact that a query is asked, does not imply that any particular answer is desired or expected.  Pt's BMI=  44.9 in setting of R TKA.    Please clarify whether or not BMI can be linked to one of he diagnoses listed below and document in pn  and d/c. Thank You!  [facility]: When linking BMI to a diagnosis please document both BMI and diagnosis together in pn for accuracy of SOI and ROM.   Possible Clinical conditions  Morbid Obesity W/ BMI=   Underweight w/BMI=  Other condition___________________  Cannot Clinically determine _____________  Risk Factors:  Hypercholesterolemia, OA, Hypothyroidism  Sign &  Symptoms: BMI=44.9 5'3"/253lbs Diagnostics: Lab:  Treatment monitoring    Reviewed: additional documentation in the medical record ljh  Thank You,  Enis Slipper RN, BSN, CCDS Clinical Documentation Specialist [facility] HIM Dept Pager: [phone] / E-mail: [email or web address]  Health Information Management [place]

## 2011-09-18 NOTE — Progress Notes (Signed)
Physical Therapy Treatment Patient Details Name: [name] MRN: [id number] DOB: November 30, 1940 Today's Date: 09/18/2011  PT Assessment/Plan  PT - Assessment/Plan Comments on Treatment Session: Pt requires ++ encouragement to progress with ther ex and ambulation. PT Plan: Discharge plan remains appropriate PT Frequency: 7X/week Follow Up Recommendations: Skilled nursing facility Equipment Recommended: Defer to next venue PT Goals  Acute Rehab PT Goals PT Goal Formulation: With patient Time For Goal Achievement: 7 days Pt will go Supine/Side to Sit: with min assist PT Goal: Supine/Side to Sit - Progress: Progressing toward goal Pt will go Sit to Supine/Side: with min assist PT Goal: Sit to Supine/Side - Progress: Progressing toward goal Pt will go Sit to Stand: with min assist PT Goal: Sit to Stand - Progress: Progressing toward goal Pt will go Stand to Sit: with min assist PT Goal: Stand to Sit - Progress: Progressing toward goal Pt will Ambulate: 51 - 150 feet;with min assist;with rolling walker PT Goal: Ambulate - Progress: Progressing toward goal  PT Treatment Precautions/Restrictions  Precautions Precautions: Knee Required Braces or Orthoses: Yes Knee Immobilizer: Discontinue once straight leg raise with < 10 degree lag Restrictions Weight Bearing Restrictions: No RLE Weight Bearing: Weight bearing as tolerated Mobility (including Balance) Bed Mobility Supine to Sit: 3: Mod assist Supine to Sit Details (indicate cue type and reason): cues for sequence, use of rail with HOB at 40 degrees Transfers Sit to Stand: 1: +2 Total assist (pt 70%) Sit to Stand Details (indicate cue type and reason): cues for use of UEs and for LE position Stand to Sit: 1: +2 Total assist Stand to Sit Details: cues for use of UEs and for LE position Ambulation/Gait Ambulation/Gait Assistance: 1: +2 Total assist Ambulation/Gait Assistance Details (indicate cue type and reason): cues for posture,  position from RW and sequence Ambulation Distance (Feet): 16 Feet Assistive device: Rolling walker Gait Pattern: Step-to pattern    Exercise  Total Joint Exercises Ankle Circles/Pumps: AAROM;20 reps;Supine;Both Quad Sets: AROM;20 reps;Supine;Both Heel Slides: AAROM;20 reps;Supine;Right Straight Leg Raises: AAROM;20 reps;Supine;Right End of Session PT - End of Session Equipment Utilized During Treatment: Right knee immobilizer Activity Tolerance: Patient limited by fatigue;Patient limited by pain Patient left: in chair;with call bell in reach Nurse Communication: Mobility status for transfers;Mobility status for ambulation General Behavior During Session: [facility] for tasks performed Cognition: [facility] for tasks performed  [name] 09/18/2011, 12:04 PM

## 2011-09-18 NOTE — Progress Notes (Signed)
Subjective: 2 Days Post-Op Procedure(s) (LRB): TOTAL KNEE ARTHROPLASTY (Right) Patient reports pain as mild and moderate.   Patient has complaints of fair amount of pain today.  She was only able to walk short distance yesterday.  Objective: Vital signs in last 24 hours: Temp:  [98.9 F (37.2 C)-99.8 F (37.7 C)] 99.1 F (37.3 C) (03/27 1441) Pulse Rate:  [72-83] 72  (03/27 1441) Resp:  [16] 16  (03/27 1441) BP: (107-143)/(51-70) 107/51 mmHg (03/27 1441) SpO2:  [90 %-94 %] 94 % (03/27 1441)  Intake/Output from previous day:  Intake/Output Summary (Last 24 hours) at 09/18/11 1511 Last data filed at 09/18/11 1440  Gross per 24 hour  Intake   1090 ml  Output   3350 ml  Net  -2260 ml    Intake/Output this shift: Total I/O In: 480 [P.O.:480] Out: 1650 [Urine:1650]  Labs:  Basename 09/18/11 0405 09/17/11 0400  HGB 9.0* 10.5*    Basename 09/18/11 0405 09/17/11 0400  WBC 11.4* 11.2*  RBC 2.71* 3.15*  HCT 28.1* 32.9*  PLT 183 209    Basename 09/18/11 0405 09/17/11 0400  NA 137 137  K 4.1 4.5  CL 99 101  CO2 31 30  BUN 16 18  CREATININE 0.97 1.03  GLUCOSE 127* 138*  CALCIUM 8.3* 8.6   No results found for this basename: LABPT:2,INR:2 in the last 72 hours  Exam - Neurovascular intact Sensation intact distally Dressing/Incision - clean, dry, no drainage, healing Motor function intact - moving foot and toes well on exam.   Past Medical History  Diagnosis Date  . Coronary atherosclerosis of native coronary artery     Multivessel  . Essential hypertension, benign   . Mixed hyperlipidemia   . Hypothyroidism   . Arthritis   . Aortic stenosis   . Atrial fibrillation     Postoperative, transiently on Coumadin  . Aftercare following joint replacement   . Knee joint replacement by other means   . Acute posthemorrhagic anemia   . Undiagnosed cardiac murmurs   . Personal history of allergy to penicillin   . Gout     HX OF GOUT  . Psoriasis     HX OF      Assessment/Plan: 2 Days Post-Op Procedure(s) (LRB): TOTAL KNEE ARTHROPLASTY (Right) Active Problems:  OA (osteoarthritis) of knee   Advance diet Up with therapy Plan for discharge tomorrow Discharge to SNF - possible to [facility]  DVT Prophylaxis - Xarelto Protocol Weight-Bearing as tolerated to right leg  [name] 09/18/2011, 3:11 PM

## 2011-09-19 DIAGNOSIS — D62 Acute posthemorrhagic anemia: Secondary | ICD-10-CM

## 2011-09-19 LAB — CBC
MCV: 102.9 fL — ABNORMAL HIGH (ref 78.0–100.0)
Platelets: 191 10*3/uL (ref 150–400)
RBC: 2.77 MIL/uL — ABNORMAL LOW (ref 3.87–5.11)
RDW: 14.6 % (ref 11.5–15.5)
WBC: 11.5 10*3/uL — ABNORMAL HIGH (ref 4.0–10.5)

## 2011-09-19 MED ORDER — ACETAMINOPHEN 325 MG PO TABS: 650.0000 mg | ORAL_TABLET | Freq: Four times a day (QID) | ORAL | Status: DC | PRN

## 2011-09-19 MED ORDER — RIVAROXABAN 10 MG PO TABS: 10.0000 mg | ORAL_TABLET | Freq: Every day | ORAL | Status: DC

## 2011-09-19 MED ORDER — BISACODYL 10 MG RE SUPP: 10.0000 mg | Freq: Every day | RECTAL | Status: AC | PRN

## 2011-09-19 MED ORDER — OXYCODONE HCL 5 MG PO TABS: 5.0000 mg | ORAL_TABLET | ORAL | Status: AC | PRN

## 2011-09-19 MED ORDER — POLYSACCHARIDE IRON COMPLEX 150 MG PO CAPS: 150.0000 mg | ORAL_CAPSULE | Freq: Every day | ORAL | Status: DC

## 2011-09-19 MED ORDER — METHOCARBAMOL 500 MG PO TABS: 500.0000 mg | ORAL_TABLET | Freq: Four times a day (QID) | ORAL | Status: AC | PRN

## 2011-09-19 MED ORDER — ONDANSETRON HCL 4 MG PO TABS: 4.0000 mg | ORAL_TABLET | Freq: Four times a day (QID) | ORAL | Status: AC | PRN

## 2011-09-19 NOTE — Discharge Instructions (Signed)
Knee Rehabilitation, Guidelines Following Surgery Results after knee surgery are often greatly improved when you follow the exercise, range of motion and muscle strengthening exercises prescribed by your doctor. Safety measures are also important to protect the knee from further injury. Any time any of these exercises cause you to have increased pain or swelling in your knee joint, decrease the amount until you are comfortable again and slowly increase them. If you have problems or questions, call your caregiver or physical therapist for advice. HOME CARE INSTRUCTIONS   Remove items at home which could result in a fall. This includes throw rugs or furniture in walking pathways.   Continue medications as instructed.   You may shower or take tub baths when your staples or stitches are removed or as instructed.   Walk using crutches or walker as instructed.   Put weight on your legs and walk as much as is comfortable.   You may resume a sexual relationship in one month or when given the OK by your doctor.   Return to work as instructed by your doctor.   Do not drive a car for 6 weeks or as instructed.   Wear elastic stockings until instructed not to.   Make sure you keep all of your appointments after your operation with all of your doctors and caregivers.  RANGE OF MOTION AND STRENGTHENING EXERCISES Rehabilitation of the knee is important following a knee injury or an operation. After just a few days of immobilization, the muscles of the thigh which control the knee become weakened and shrink (atrophy). Knee exercises are designed to build up the tone and strength of the thigh muscles and to improve knee motion. Often times heat used for twenty to thirty minutes before working out will loosen up your tissues and help with improving the range of motion. These exercises can be done on a training (exercise) mat, on the floor, on a table or on a bed. Use what ever works the best and is most  comfortable for you Knee exercises include:  Leg Lifts - While your knee is still immobilized in a splint or cast, you can do straight leg raises. Lift the leg to 60 degrees, hold for 3 sec, and slowly lower the leg. Repeat 10-20 times 2-3 times daily. Perform this exercise against resistance later as your knee gets better.   Quad and Hamstring Sets - Tighten up the muscle on the front of the thigh (Quad) and hold for 5-10 sec. Repeat this 10-20 times hourly. Hamstring sets are done by pushing the foot backward against an object and holding for 5-10 sec. Repeat as with quad sets.  A rehabilitation program following serious knee injuries can speed recovery and prevent re-injury in the future due to weakened muscles. Contact your doctor or a physical therapist for more information on knee rehabilitation. MAKE SURE YOU:   Understand these instructions.   Will watch your condition.   Will get help right away if you are not doing well or get worse.  Document Released: 06/10/2005 Document Revised: 05/30/2011 Document Reviewed: 11/28/2006 [employer] Patient Information 2012 [employer].  Pick up stool softner and laxative for home. Do not submerge incision under water. May shower. Continue to use ice for pain and swelling from surgery.  

## 2011-09-19 NOTE — Discharge Summary (Signed)
Physician Discharge Summary   Patient ID: [name] MRN: [id number] DOB/AGE: Dec 26, 1940 71 y.o.  Admit date: 09/16/2011 Discharge date: 09/19/2011   Primary Diagnosis: Osteoarthritis Right Knee   Admission Diagnoses: Past Medical History  Diagnosis Date  . Coronary atherosclerosis of native coronary artery     Multivessel  . Essential hypertension, benign   . Mixed hyperlipidemia   . Hypothyroidism   . Arthritis   . Aortic stenosis   . Atrial fibrillation     Postoperative, transiently on Coumadin  . Aftercare following joint replacement   . Knee joint replacement by other means   . Acute posthemorrhagic anemia   . Undiagnosed cardiac murmurs   . Personal history of allergy to penicillin   . Gout     HX OF GOUT  . Psoriasis     HX OF     Discharge Diagnoses:  Active Problems:  OA (osteoarthritis) of knee  Postop Acute blood loss anemia   Procedure: Procedure(s) (LRB): TOTAL KNEE ARTHROPLASTY (Right)   Consults: None  HPI: [name] is a 71 y.o. year old female with end stage OA of her right knee with progressively worsening pain and dysfunction. She has constant pain, with activity and at rest and significant functional deficits with difficulties even with ADLs. She has had extensive non-op management including analgesics, injections of cortisone and viscosupplements, and home exercise program, but remains in significant pain with significant dysfunction.Radiographs show bone on bone arthritis medial and patellofemoral with tibial subchondral sclerosis. She presents now for left Total Knee Arthroplasty.   Laboratory Data: Hospital Outpatient Visit on 09/06/2011  Component Date Value Range Status  . MRSA, PCR  09/06/2011 NEGATIVE  NEGATIVE Final  . Staphylococcus aureus  09/06/2011 NEGATIVE  NEGATIVE Final   Comment:                                 The Xpert SA Assay (FDA                          approved for NASAL specimens                          only), is  one component of                          a comprehensive surveillance                          program.  It is not intended                          to diagnose infection nor to                          guide or monitor treatment.  Marland Kitchen aPTT (seconds) 09/06/2011 28  24-37 Final  . WBC (K/uL) 09/06/2011 8.2  4.0-10.5 Final  . RBC (MIL/uL) 09/06/2011 3.93  3.87-5.11 Final  . Hemoglobin (g/dL) 40/98/1191 47.8  29.5-62.1 Final  . HCT (%) 09/06/2011 40.4  36.0-46.0 Final  . MCV (fL) 09/06/2011 102.8* 78.0-100.0 Final  . MCH (pg) 09/06/2011 33.3  26.0-34.0 Final  . MCHC (g/dL) 30/86/5784 69.6  29.5-28.4 Final  . RDW (%) 09/06/2011 14.6  11.5-15.5 Final  .  Platelets (K/uL) 09/06/2011 275  150-400 Final  . Sodium (mEq/L) 09/06/2011 143  135-145 Final  . Potassium (mEq/L) 09/06/2011 4.4  3.5-5.1 Final  . Chloride (mEq/L) 09/06/2011 103  96-112 Final  . CO2 (mEq/L) 09/06/2011 30  19-32 Final  . Glucose, Bld (mg/dL) 16/03/9603 97  54-09 Final  . BUN (mg/dL) 81/19/1478 21  2-95 Final  . Creatinine, Ser (mg/dL) 62/13/0865 7.84  6.96-2.95 Final  . Calcium (mg/dL) 28/41/3244 9.2  0.1-02.7 Final  . Total Protein (g/dL) 25/36/6440 6.8  3.4-7.4 Final  . Albumin (g/dL) 25/95/6387 3.7  5.6-4.3 Final  . AST (U/L) 09/06/2011 24  0-37 Final  . ALT (U/L) 09/06/2011 11  0-35 Final  . Alkaline Phosphatase (U/L) 09/06/2011 78  39-117 Final  . Total Bilirubin (mg/dL) 32/95/1884 0.3  1.6-6.0 Final  . GFR calc non Af Amer (mL/min) 09/06/2011 51* >90 Final  . GFR calc Af Amer (mL/min) 09/06/2011 60* >90 Final   Comment:                                 The eGFR has been calculated                          using the CKD EPI equation.                          This calculation has not been                          validated in all clinical                          situations.                          eGFR's persistently                          <90 mL/min signify                          possible Chronic Kidney  Disease.  Marland Kitchen Prothrombin Time (seconds) 09/06/2011 12.9  11.6-15.2 Final  . INR  09/06/2011 0.95  0.00-1.49 Final  . Color, Urine  09/06/2011 YELLOW  YELLOW Final  . APPearance  09/06/2011 CLEAR  CLEAR Final  . Specific Gravity, Urine  09/06/2011 1.011  1.005-1.030 Final  . pH  09/06/2011 6.0  5.0-8.0 Final  . Glucose, UA (mg/dL) 63/06/6008 NEGATIVE  NEGATIVE Final  . Hgb urine dipstick  09/06/2011 NEGATIVE  NEGATIVE Final  . Bilirubin Urine  09/06/2011 NEGATIVE  NEGATIVE Final  . Ketones, ur (mg/dL) 93/23/5573 NEGATIVE  NEGATIVE Final  . Protein, ur (mg/dL) 22/07/5425 NEGATIVE  NEGATIVE Final  . Urobilinogen, UA (mg/dL) 12/15/7626 0.2  3.1-5.1 Final  . Nitrite  09/06/2011 NEGATIVE  NEGATIVE Final  . Leukocytes, UA  09/06/2011 NEGATIVE  NEGATIVE Final   MICROSCOPIC NOT DONE ON URINES WITH NEGATIVE PROTEIN, BLOOD, LEUKOCYTES, NITRITE, OR GLUCOSE <1000 mg/dL.    Basename 09/19/11 0502 09/18/11 0405 09/17/11 0400  HGB 9.2* 9.0* 10.5*    Basename 09/19/11 0502 09/18/11 0405  WBC 11.5* 11.4*  RBC 2.77* 2.71*  HCT 28.5* 28.1*  PLT 191 183    Basename 09/18/11  0405 09/17/11 0400  NA 137 137  K 4.1 4.5  CL 99 101  CO2 31 30  BUN 16 18  CREATININE 0.97 1.03  GLUCOSE 127* 138*  CALCIUM 8.3* 8.6   No results found for this basename: LABPT:2,INR:2 in the last 72 hours  X-Rays:No results found.  EKG: Orders placed during the hospital encounter of 09/06/11  . EKG 12-LEAD  . EKG 12-LEAD     Hospital Course: Patient was admitted to [facility] and taken to the OR and underwent the above state procedure without complications.  Patient tolerated the procedure well and was later transferred to the recovery room and then to the orthopaedic floor for postoperative care.  They were given PO and IV analgesics for pain control following their surgery.  They were given 24 hours of postoperative antibiotics and started on DVT prophylaxis.   PT and OT were ordered for total joint  protocol.  Discharge planning consulted to help with postop disposition and equipment needs.  She wanted to look into SNF and [facility].  Patient had a tough night on the evening of surgery but started to get up with therapy on day one but only able to walk short distances.  PCA Morphine was discontinued and they were weaned over to PO meds.  Hemovac drain was pulled without difficulty.  Continued to progress with therapy a little further into day two.  Dressing was changed on day two and the incision was healing well.  By day three, the patient had progressed with therapy and meeting goals.  Incision was healing well.  Patient was seen in rounds and was ready for transfer to [facility]  Discharge Medications: Prior to Admission medications   Medication Sig Start Date End Date Taking? Authorizing Provider  Casanthranol-Docusate Sodium 30-100 MG CAPS Take 2 tablets by mouth at bedtime.     Yes Historical Provider, MD  furosemide (LASIX) 40 MG tablet Take 40 mg by mouth daily after breakfast.    Yes Historical Provider, MD  levothyroxine (SYNTHROID, LEVOTHROID) 88 MCG tablet Take 88 mcg by mouth daily before breakfast.    Yes Historical Provider, MD  metoprolol (TOPROL XL) 50 MG 24 hr tablet Take 50 mg by mouth daily after breakfast.    Yes Historical Provider, MD  potassium chloride SA (K-DUR,KLOR-CON) 20 MEQ tablet Take 20 mEq by mouth daily.     Yes Historical Provider, MD  acetaminophen (TYLENOL) 325 MG tablet Take 2 tablets (650 mg total) by mouth every 6 (six) hours as needed (or Fever >/= 101). 09/19/11 09/18/12  Romond Pipkins, PA  bisacodyl (DULCOLAX) 10 MG suppository Place 1 suppository (10 mg total) rectally daily as needed. 09/19/11 09/29/11  Trentin Knappenberger, PA  fenofibrate (TRICOR) 145 MG tablet Take 145 mg by mouth at bedtime.     Historical Provider, MD  iron polysaccharides (NIFEREX) 150 MG capsule Take 1 capsule (150 mg total) by mouth daily. Take for three weeks and then discontinue.  09/19/11 09/18/12  Yazlynn Birkeland, PA  methocarbamol (ROBAXIN) 500 MG tablet Take 1 tablet (500 mg total) by mouth every 6 (six) hours as needed. 09/19/11 09/29/11  Cabell Lazenby, PA  ondansetron (ZOFRAN) 4 MG tablet Take 1 tablet (4 mg total) by mouth every 6 (six) hours as needed for nausea. 09/19/11 09/26/11  Krisann Mckenna, PA  oxyCODONE (OXY IR/ROXICODONE) 5 MG immediate release tablet Take 1-2 tablets (5-10 mg total) by mouth every 4 (four) hours as needed. 09/19/11 09/29/11  Marlane Hirschmann Julien Girt, PA  polyethylene  glycol (MIRALAX / GLYCOLAX) packet Take 17 g by mouth daily.    Historical Provider, MD  rivaroxaban (XARELTO) 10 MG TABS tablet Take 1 tablet (10 mg total) by mouth daily with breakfast. Take for two and a half more weeks and then discontinue.  After completing the Xarelto, patient may resume her Aspirin 325 mg daily. 09/19/11   Larrie Lucia Julien Girt, PA    Diet: heart healthy Activity:WBAT Follow-up:in 2 weeks Disposition: [facility] Discharged Condition: good   Discharge Orders    Future Appointments: Provider: Department: Dept Phone: Center:   11/07/2011 3:00 PM [name], MD [facility] [phone] [facility]     Future Orders Please Complete By Expires   Diet - low sodium heart healthy      Call MD / Call 911      Comments:   If you experience chest pain or shortness of breath, CALL 911 and be transported to the hospital emergency room.  If you develope a fever above 101 F, pus (white drainage) or increased drainage or redness at the wound, or calf pain, call your surgeon's office.   Constipation Prevention      Comments:   Drink plenty of fluids.  Prune juice may be helpful.  You may use a stool softener, such as Colace (over the counter) 100 mg twice a day.  Use MiraLax (over the counter) for constipation as needed.   Increase activity slowly as tolerated      Weight Bearing as taught in Physical Therapy      Comments:   Use a  walker or crutches as instructed.   Discharge instructions      Comments:   Pick up stool softner and laxative for home. Do not submerge incision under water. May shower. Continue to use ice for pain and swelling from surgery.    Driving restrictions      Comments:   No driving   Lifting restrictions      Comments:   No lifting   TED hose      Comments:   Use stockings (TED hose) for 3 weeks on both leg(s).  You may remove them at night for sleeping.   Change dressing      Comments:   Change dressing daily with sterile 4 x 4 inch gauze dressing and apply TED hose.   Do not put a pillow under the knee. Place it under the heel.        Medication List  As of 09/19/2011  8:55 AM   STOP taking these medications         aspirin 325 MG tablet      atorvastatin 20 MG tablet      CENTRUM SILVER PO      ergocalciferol 50000 UNITS capsule      Fish Oil 1000 MG Caps      folic acid 1 MG tablet      Glucosamine-Chondroitin Caps      HYDROcodone-acetaminophen 5-500 MG per tablet      methotrexate 2.5 MG tablet      OSCAL 500/200 D-3 500-200 MG-UNIT per tablet         TAKE these medications         acetaminophen 325 MG tablet   Commonly known as: TYLENOL   Take 2 tablets (650 mg total) by mouth every 6 (six) hours as needed (or Fever >/= 101).      bisacodyl 10 MG suppository   Commonly known as: DULCOLAX  Place 1 suppository (10 mg total) rectally daily as needed.      Casanthranol-Docusate Sodium 30-100 MG Caps   Take 2 tablets by mouth at bedtime.      fenofibrate 145 MG tablet   Commonly known as: TRICOR   Take 145 mg by mouth at bedtime.      furosemide 40 MG tablet   Commonly known as: LASIX   Take 40 mg by mouth daily after breakfast.      iron polysaccharides 150 MG capsule   Commonly known as: NIFEREX   Take 1 capsule (150 mg total) by mouth daily. Take for three weeks and then discontinue.      levothyroxine 88 MCG tablet   Commonly known as:  SYNTHROID, LEVOTHROID   Take 88 mcg by mouth daily before breakfast.      methocarbamol 500 MG tablet   Commonly known as: ROBAXIN   Take 1 tablet (500 mg total) by mouth every 6 (six) hours as needed.      ondansetron 4 MG tablet   Commonly known as: ZOFRAN   Take 1 tablet (4 mg total) by mouth every 6 (six) hours as needed for nausea.      oxyCODONE 5 MG immediate release tablet   Commonly known as: Oxy IR/ROXICODONE   Take 1-2 tablets (5-10 mg total) by mouth every 4 (four) hours as needed.      polyethylene glycol packet   Commonly known as: MIRALAX / GLYCOLAX   Take 17 g by mouth daily.      potassium chloride SA 20 MEQ tablet   Commonly known as: K-DUR,KLOR-CON   Take 20 mEq by mouth daily.      rivaroxaban 10 MG Tabs tablet   Commonly known as: XARELTO   Take 1 tablet (10 mg total) by mouth daily with breakfast. Take for two and a half more weeks and then discontinue.  After completing the Xarelto, patient may resume her Aspirin 325 mg daily.      TOPROL XL 50 MG 24 hr tablet   Generic drug: metoprolol succinate   Take 50 mg by mouth daily after breakfast.           Follow-up Information    Follow up with Loanne Drilling, MD. Schedule an appointment as soon as possible for a visit in 2 weeks. (Please have facility assist patient in setting up appointment and help arrange transportation to the office.)    Contact information:   [facility] [address], [address] [place] [address] [phone]          Signed: Patrica Duel 09/19/2011, 8:55 AM

## 2011-09-19 NOTE — Progress Notes (Signed)
Physical Therapy Treatment Patient Details Name: [name] MRN: [id number] DOB: 08-01-1940 Today's Date: 09/19/2011  PT Assessment/Plan  PT - Assessment/Plan Comments on Treatment Session: Pt requires increased time for all tasks.   PT Plan: Discharge plan remains appropriate PT Frequency: 7X/week Recommendations for Other Services: OT consult Follow Up Recommendations: Skilled nursing facility Equipment Recommended: Defer to next venue PT Goals  Acute Rehab PT Goals PT Goal Formulation: With patient Time For Goal Achievement: 7 days Pt will go Supine/Side to Sit: with min assist PT Goal: Supine/Side to Sit - Progress: Progressing toward goal Pt will go Sit to Supine/Side: with min assist PT Goal: Sit to Supine/Side - Progress: Progressing toward goal Pt will go Sit to Stand: with min assist PT Goal: Sit to Stand - Progress: Progressing toward goal Pt will go Stand to Sit: with min assist PT Goal: Stand to Sit - Progress: Progressing toward goal Pt will Ambulate: 51 - 150 feet;with min assist;with rolling walker PT Goal: Ambulate - Progress: Progressing toward goal  PT Treatment Precautions/Restrictions  Precautions Precautions: Knee Required Braces or Orthoses: Yes Knee Immobilizer: Discontinue once straight leg raise with < 10 degree lag Restrictions Weight Bearing Restrictions: No RLE Weight Bearing: Weight bearing as tolerated   Exercise  Total Joint Exercises Ankle Circles/Pumps: AAROM;20 reps;Supine;Both Quad Sets: AROM;15 reps;Supine;Both Heel Slides: AAROM;15 reps;Supine;Right Straight Leg Raises: AAROM;15 reps;Right;Supine End of Session PT - End of Session Activity Tolerance: Patient limited by fatigue;Patient limited by pain Patient left: in bed;with call bell in reach Nurse Communication: Mobility status for transfers;Mobility status for ambulation General Behavior During Session: [facility] for tasks performed Cognition: [facility] for tasks  performed  Aigner Horseman 09/19/2011, 3:10 PM

## 2011-09-19 NOTE — Progress Notes (Signed)
I assisted resident to [facility] twice today and she continues to require max assistance when transferring. Discharge instructions and education given and resident verbalizes understanding. Assisted to stretcher by transporter and myself. All belongings given to transporters and she is to discharge to SNF [facility]). No acute distress noted when transported off floor.

## 2011-09-19 NOTE — Progress Notes (Signed)
Physical Therapy Treatment Patient Details Name: ASHYAH QUIZON MRN: [id number] DOB: 04/07/1941 Today's Date: 09/19/2011  PT Assessment/Plan  PT - Assessment/Plan Comments on Treatment Session: Pt requires increased time for all tasks. PT Plan: Discharge plan remains appropriate PT Frequency: 7X/week Recommendations for Other Services: OT consult Follow Up Recommendations: Skilled nursing facility Equipment Recommended: Defer to next venue PT Goals  Acute Rehab PT Goals PT Goal Formulation: With patient Time For Goal Achievement: 7 days Pt will go Sit to Supine/Side: with min assist PT Goal: Sit to Supine/Side - Progress: Progressing toward goal Pt will go Sit to Stand: with min assist PT Goal: Sit to Stand - Progress: Progressing toward goal Pt will go Stand to Sit: with min assist PT Goal: Stand to Sit - Progress: Progressing toward goal Pt will Ambulate: 51 - 150 feet;with min assist;with rolling walker PT Goal: Ambulate - Progress: Progressing toward goal  PT Treatment Precautions/Restrictions  Precautions Precautions: Knee Required Braces or Orthoses: Yes Knee Immobilizer: Discontinue once straight leg raise with < 10 degree lag Restrictions Weight Bearing Restrictions: No RLE Weight Bearing: Weight bearing as tolerated Mobility (including Balance) Bed Mobility Sit to Supine: 3: Mod assist Sit to Supine - Details (indicate cue type and reason): cues for sequence, use of L LE to self assist Transfers Sit to Stand: 1: +2 Total assist (pt 60%) Sit to Stand Details (indicate cue type and reason): cues for use of UEs and for LE position Stand to Sit: 1: +2 Total assist (pt 70%) Stand to Sit Details: cues for use of UEs and for LE position Ambulation/Gait Ambulation/Gait Assistance: 1: +2 Total assist (pt 70%) Ambulation/Gait Assistance Details (indicate cue type and reason): cues for sequence, position from RW, increased WB on UEs and posture Ambulation Distance (Feet): 4  Feet (4x2) Assistive device: Rolling walker Gait Pattern: Step-to pattern    Exercise    End of Session PT - End of Session Activity Tolerance: Patient limited by fatigue;Patient limited by pain Patient left: in bed;with call bell in reach Nurse Communication: Mobility status for transfers;Mobility status for ambulation General Behavior During Session: [facility] for tasks performed Cognition: [facility] for tasks performed  Elyana Grabski 09/19/2011, 1:50 PM

## 2011-09-19 NOTE — Progress Notes (Signed)
Patient OOB to reclining chair this morning, noted with some c/o pain. She was given prn medication and statesshe feels no pain at this time. She is scheduled for discharge today to SNF, no acute s/s of distress noted at this time.

## 2011-09-19 NOTE — Progress Notes (Signed)
Subjective: 3 Days Post-Op Procedure(s) (LRB): TOTAL KNEE ARTHROPLASTY (Right) Patient reports pain as mild.   Patient seen in rounds with Dr. Lequita Halt. Patient doing well. Plans to go to [facility].  Objective: Vital signs in last 24 hours: Temp:  [99 F (37.2 C)-99.4 F (37.4 C)] 99 F (37.2 C) (03/28 0520) Pulse Rate:  [69-72] 69  (03/28 0520) Resp:  [16-20] 20  (03/28 0520) BP: (107-119)/(51-69) 119/69 mmHg (03/28 0520) SpO2:  [91 %-94 %] 91 % (03/28 0520)  Intake/Output from previous day:  Intake/Output Summary (Last 24 hours) at 09/19/11 0839 Last data filed at 09/19/11 0520  Gross per 24 hour  Intake    360 ml  Output   2100 ml  Net  -1740 ml    Intake/Output this shift:    Labs:  Basename 09/19/11 0502 09/18/11 0405 09/17/11 0400  HGB 9.2* 9.0* 10.5*    Basename 09/19/11 0502 09/18/11 0405  WBC 11.5* 11.4*  RBC 2.77* 2.71*  HCT 28.5* 28.1*  PLT 191 183    Basename 09/18/11 0405 09/17/11 0400  NA 137 137  K 4.1 4.5  CL 99 101  CO2 31 30  BUN 16 18  CREATININE 0.97 1.03  GLUCOSE 127* 138*  CALCIUM 8.3* 8.6   No results found for this basename: LABPT:2,INR:2 in the last 72 hours  Exam: Neurovascular intact Sensation intact distally Incision - clean, dry, no drainage Motor function intact - moving foot and toes well on exam.   Assessment/Plan: 3 Days Post-Op Procedure(s) (LRB): TOTAL KNEE ARTHROPLASTY (Right) Procedure(s) (LRB): TOTAL KNEE ARTHROPLASTY (Right) Past Medical History  Diagnosis Date  . Coronary atherosclerosis of native coronary artery     Multivessel  . Essential hypertension, benign   . Mixed hyperlipidemia   . Hypothyroidism   . Arthritis   . Aortic stenosis   . Atrial fibrillation     Postoperative, transiently on Coumadin  . Aftercare following joint replacement   . Knee joint replacement by other means   . Acute posthemorrhagic anemia   . Undiagnosed cardiac murmurs   . Personal history of allergy to  penicillin   . Gout     HX OF GOUT  . Psoriasis     HX OF    Active Problems:  OA (osteoarthritis) of knee   Discharge to SNF Diet - heart healthy Follow up - in 2 weeks Activity - WBAT Condition Upon Discharge - Good D/C Meds - See DC Summary DVT Prophylaxis - Xarelto Protocol   PERKINS, ALEXZANDREW 09/19/2011, 8:39 AM

## 2011-09-19 NOTE — Progress Notes (Signed)
Pt is planning to D/C to [facility] today via [facility] transport for ST SNF placement. Pt / family are agreeable with d/c plan.  Cori Razor  LCSW  [phone]

## 2011-09-20 ENCOUNTER — Encounter (HOSPITAL_COMMUNITY): Payer: Self-pay | Admitting: Orthopedic Surgery

## 2011-11-07 ENCOUNTER — Encounter: Payer: Self-pay | Admitting: Cardiology

## 2011-11-07 ENCOUNTER — Ambulatory Visit (INDEPENDENT_AMBULATORY_CARE_PROVIDER_SITE_OTHER): Payer: Medicare Other | Admitting: Cardiology

## 2011-11-07 VITALS — BP 126/66 | HR 66 | Ht 63.0 in | Wt 256.4 lb

## 2011-11-07 DIAGNOSIS — I359 Nonrheumatic aortic valve disorder, unspecified: Secondary | ICD-10-CM

## 2011-11-07 DIAGNOSIS — Z79899 Other long term (current) drug therapy: Secondary | ICD-10-CM

## 2011-11-07 DIAGNOSIS — I4891 Unspecified atrial fibrillation: Secondary | ICD-10-CM

## 2011-11-07 DIAGNOSIS — E785 Hyperlipidemia, unspecified: Secondary | ICD-10-CM

## 2011-11-07 MED ORDER — ATORVASTATIN CALCIUM 10 MG PO TABS: 10.0000 mg | ORAL_TABLET | Freq: Every day | ORAL | Status: DC

## 2011-11-07 NOTE — Patient Instructions (Signed)
Your physician recommends that you schedule a follow-up appointment in: 6 months. You will receive a reminder letter in the mail in about 4 months reminding you to call and schedule your appointment. If you don't receive this letter, please contact our office to schedule your appointment.  Your physician recommends that you return for lab work in: 3 months at [facility]. You have been given the orders today to take with you.   Your physician recommends that you continue on your current medications as directed. Please refer to the Current Medication list given to you today.   Refill sent today for your atorvastatin to your pharmacy.

## 2011-11-07 NOTE — Assessment & Plan Note (Signed)
Symptomatically stable on medical therapy. No changes made today. She is not reporting any angina while undergoing rehabilitation following knee surgery. Routine followup arranged.

## 2011-11-07 NOTE — Assessment & Plan Note (Signed)
Continue Lipitor and TriCor with followup FLP and LFT in 3 months.

## 2011-11-07 NOTE — Assessment & Plan Note (Signed)
Blood pressure control is good today. 

## 2011-11-07 NOTE — Assessment & Plan Note (Signed)
Status post pericardial AVR.

## 2011-11-07 NOTE — Progress Notes (Signed)
Clinical Summary [name] is a 71 y.o.female presenting for followup. She was seen in November 2012. Record review finds hospital admission in March of this year for right knee replacement. No obvious perioperative cardiac issues noted.  She continues to work with outpatient rehabilitation. Reports no angina or palpitations.  She has tolerated Lipitor and TriCor,  recently added to her regimen. No followup lipids as yet.  ECG today shows sinus rhythm at 66 with R prime in lead V1 and V2, nonspecific ST changes.   Allergies  Allergen Reactions  . Penicillins     REACTION: rash: swelling  . Statins     REACTION: MUSCLE ACHE    Current Outpatient Prescriptions  Medication Sig Dispense Refill  . atorvastatin (LIPITOR) 10 MG tablet Take 10 mg by mouth daily.      [name] 30-100 MG CAPS Take 2 tablets by mouth at bedtime.        . fenofibrate (TRICOR) 145 MG tablet Take 145 mg by mouth at bedtime.       . folic acid (FOLVITE) 1 MG tablet Take 1 mg by mouth daily.       . furosemide (LASIX) 40 MG tablet Take 40 mg by mouth daily after breakfast.       . levothyroxine (SYNTHROID, LEVOTHROID) 88 MCG tablet Take 88 mcg by mouth daily before breakfast.       . metoprolol (TOPROL XL) 50 MG 24 hr tablet Take 50 mg by mouth daily after breakfast.       . oxyCODONE (OXY IR/ROXICODONE) 5 MG immediate release tablet Take 5 mg by mouth as needed.       . polyethylene glycol (MIRALAX / GLYCOLAX) packet Take 17 g by mouth daily.      . potassium chloride SA (K-DUR,KLOR-CON) 20 MEQ tablet Take 20 mEq by mouth daily.        . Vitamin D, Ergocalciferol, (DRISDOL) 50000 UNITS CAPS Take 50,000 Units by mouth every 7 (seven) days.       Marland Kitchen DISCONTD: ezetimibe (ZETIA) 10 MG tablet Take 10 mg by mouth daily.          Past Medical History  Diagnosis Date  . Coronary atherosclerosis of native coronary artery     Multivessel  . Essential hypertension, benign   . Mixed hyperlipidemia    . Hypothyroidism   . Arthritis   . Aortic stenosis   . Atrial fibrillation     Postoperative, transiently on Coumadin  . Gout   . Psoriasis     Past Surgical History  Procedure Date  . Coronary artery bypass graft 2009    LIMA to LAD, SVG to RCA  . Aortic valve replacement 2009    21 mm Edwards Magna pericardial valve  . Cholecystectomy     2011  . Total thyroidectomy 1970  . Joint replacement 2012    L TOTAL KNEE  . Total knee arthroplasty 09/16/2011    Procedure: TOTAL KNEE ARTHROPLASTY;  Surgeon: April Drilling, MD;  Location: [facility];  Service: Orthopedics;  Laterality: Right;    Social History [name] reports that she quit smoking about 22 years ago. She has never used smokeless tobacco. [name] reports that she does not drink alcohol.  Review of Systems Stable appetite. No bleeding problems. Some swelling in the right knee but no unusual pain. No falls. Otherwise negative except as outlined.  Physical Examination Filed Vitals:   11/07/11 1450  BP: 126/66  Pulse: 66  Morbidly obese woman in no acute distress.  HEENT: Conjunctiva and lids normal, oropharynx with moist mucosa.  Neck: Supple, no elevated venous pressure bruits.  Lungs: Diminished but clear.  Cardiac: Regular rate and rhythm with 2/6 systolic murmur, no S3 gallop.  Abdomen: Obese, nontender.  Extremity: Trace ankle edema, nonpitting.   Problem List and Plan   CORONARY ATHEROSCLEROSIS NATIVE CORONARY ARTERY Symptomatically stable on medical therapy. No changes made today. She is not reporting any angina while undergoing rehabilitation following knee surgery. Routine followup arranged.  HYPERLIPIDEMIA-MIXED Continue Lipitor and TriCor with followup FLP and LFT in 3 months.  Essential hypertension, benign Blood pressure control is good today.  AORTIC STENOSIS Status post pericardial AVR.     [name], M.D., F.A.C.C.

## 2012-01-22 ENCOUNTER — Telehealth: Payer: Self-pay | Admitting: *Deleted

## 2012-01-22 NOTE — Telephone Encounter (Signed)
Notes Recorded by Lesle Chris, LPN on 1/61/0960 at 2:03 PM Patient notified and verbalized understanding.

## 2012-01-22 NOTE — Telephone Encounter (Signed)
Message copied by Lesle Chris on Wed Jan 22, 2012  2:04 PM ------      Message from: MCDOWELL, Illene Bolus      Created: Wed Jan 22, 2012  9:08 AM       LFTs normal. Lipids reviewed and are reasonable. Triglycerides normal at 110, total cholesterol 173, LDL 99. Continue same.

## 2014-03-16 ENCOUNTER — Encounter: Payer: Self-pay | Admitting: Cardiology

## 2014-03-16 ENCOUNTER — Ambulatory Visit (INDEPENDENT_AMBULATORY_CARE_PROVIDER_SITE_OTHER): Payer: PRIVATE HEALTH INSURANCE | Admitting: Cardiology

## 2014-03-16 VITALS — BP 120/68 | HR 61 | Ht 63.0 in | Wt 273.0 lb

## 2014-03-16 DIAGNOSIS — I2510 Atherosclerotic heart disease of native coronary artery without angina pectoris: Secondary | ICD-10-CM

## 2014-03-16 DIAGNOSIS — R609 Edema, unspecified: Secondary | ICD-10-CM

## 2014-03-16 DIAGNOSIS — E785 Hyperlipidemia, unspecified: Secondary | ICD-10-CM

## 2014-03-16 DIAGNOSIS — I359 Nonrheumatic aortic valve disorder, unspecified: Secondary | ICD-10-CM

## 2014-03-16 DIAGNOSIS — R600 Localized edema: Secondary | ICD-10-CM

## 2014-03-16 DIAGNOSIS — I10 Essential (primary) hypertension: Secondary | ICD-10-CM

## 2014-03-16 MED ORDER — FUROSEMIDE 40 MG PO TABS: 40.0000 mg | ORAL_TABLET | Freq: Every day | ORAL | Status: DC

## 2014-03-16 NOTE — Assessment & Plan Note (Signed)
Patient status post pericardial AVR in 2009 with CABG. Followup echocardiogram will be obtained to reassess valve function.

## 2014-03-16 NOTE — Patient Instructions (Signed)
Your physician recommends that you schedule a follow-up appointment in: 6 months. You will receive a reminder letter in the mail in about 4 months reminding you to call and schedule your appointment. If you don't receive this letter, please contact our office. Your physician has recommended you make the following change in your medication:  Increase your furosemide to 40 mg in the morning and 20 mg in the evening. Continue all other medications the same. Your physician recommends that you have lab work in 2 weeks around 10.07/15 to check your BMET. Your physician has requested that you have an echocardiogram. Echocardiography is a painless test that uses sound waves to create images of your heart. It provides your doctor with information about the size and shape of your heart and how well your heart's chambers and valves are working. This procedure takes approximately one hour. There are no restrictions for this procedure.

## 2014-03-16 NOTE — Assessment & Plan Note (Signed)
Continues on TriCor and Lipitor, followed by Dr. Sherril Croon.

## 2014-03-16 NOTE — Assessment & Plan Note (Signed)
Increase Lasix to total of 60 mg daily. Followup BMET in 2 weeks.

## 2014-03-16 NOTE — Assessment & Plan Note (Signed)
Blood pressure is normal today. 

## 2014-03-16 NOTE — Assessment & Plan Note (Signed)
No angina symptoms status post CABG in 2009. ECG reviewed and stable. We will continue medical therapy and observation, followup assessment of cardiac structure and function by echocardiogram. Establish more regular followup.

## 2014-03-16 NOTE — Progress Notes (Signed)
Clinical Summary Ms. Eckardt is a 73 y.o.female not seen in the office since May 2013. Since I saw her she has gained approximately 20 pounds. States that she feels more short of breath, has also had more leg edema, right worse than left. She attributes this also to bilateral knee replacements. She still tries to exercise at the [facility]. No chest pain or palpitations.  ECG today shows sinus rhythm with R' in lead V1 and V2, LVH with repolarization abnormality.  Lipid panel in 2013 showed triglycerides 110, cholesterol 173, HDL 52, LDL 99. She has been following in the interim with Dr. Sherril Croon,  She has not had a followup echocardiogram since 2009.   Allergies  Allergen Reactions  . Penicillins     REACTION: rash: swelling  . Statins     REACTION: MUSCLE ACHE    Current Outpatient Prescriptions  Medication Sig Dispense Refill  . aspirin 325 MG tablet Take 325 mg by mouth daily.      Marland Kitchen atorvastatin (LIPITOR) 10 MG tablet Take 10 mg by mouth daily.      Jennette Banker Sodium 30-100 MG CAPS Take 2 tablets by mouth once.       . fenofibrate (TRICOR) 145 MG tablet Take 145 mg by mouth at bedtime.       . furosemide (LASIX) 40 MG tablet Take 1 tablet (40 mg total) by mouth daily after breakfast. & 1/2 tablet to equal 20 mg in the evening  45 tablet  6  . levothyroxine (SYNTHROID, LEVOTHROID) 100 MCG tablet Take 100 mcg by mouth daily.      . Methylcellulose, Laxative, (CITRUCEL PO) Take by mouth daily.      . metoprolol (TOPROL XL) 50 MG 24 hr tablet Take 50 mg by mouth daily after breakfast.       . Misc Natural Products (OSTEO BI-FLEX JOINT SHIELD PO) Take by mouth daily.      . Multiple Vitamins-Minerals (CENTRUM SILVER PO) Take by mouth daily.      . potassium chloride SA (K-DUR,KLOR-CON) 20 MEQ tablet Take 20 mEq by mouth daily.        . Vitamin D, Ergocalciferol, (DRISDOL) 50000 UNITS CAPS Take 50,000 Units by mouth every 7 (seven) days.       . [DISCONTINUED] ezetimibe (ZETIA) 10  MG tablet Take 10 mg by mouth daily.         No current facility-administered medications for this visit.    Past Medical History  Diagnosis Date  . Coronary atherosclerosis of native coronary artery     Multivessel  . Essential hypertension, benign   . Mixed hyperlipidemia   . Hypothyroidism   . Arthritis   . Aortic stenosis   . Atrial fibrillation     Postoperative, transiently on Coumadin  . Gout   . Psoriasis     Past Surgical History  Procedure Laterality Date  . Coronary artery bypass graft  2009    LIMA to LAD, SVG to RCA  . Aortic valve replacement  2009    21 mm Edwards Magna pericardial valve  . Cholecystectomy      2011  . Total thyroidectomy  1970  . Joint replacement  2012    L TOTAL KNEE  . Total knee arthroplasty  09/16/2011    Procedure: TOTAL KNEE ARTHROPLASTY;  Surgeon: Loanne Drilling, MD;  Location: [facility];  Service: Orthopedics;  Laterality: Right;    Social History Ms. Loose reports that she quit smoking about 24  years ago. She has never used smokeless tobacco. Ms. Spinney reports that she does not drink alcohol.  Review of Systems Other systems reviewed and negative except as outlined.  Physical Examination Filed Vitals:   03/16/14 1249  BP: 120/68  Pulse: 61   Filed Weights   03/16/14 1249  Weight: 273 lb (123.832 kg)    Morbidly obese woman in no acute distress.  HEENT: Conjunctiva and lids normal, oropharynx with moist mucosa.  Neck: Supple, no elevated venous pressure bruits.  Lungs: Diminished but clear.  Cardiac: Regular rate and rhythm with 2/6 systolic murmur, no S3 gallop.  Abdomen: Obese, nontender.  Extremity: 2+ edema right worse than left. Skin: Warm and dry. Muscular skeletal: No kyphosis. Neuropsychiatric: Alert and oriented x3, affect appropriate.   Problem List and Plan   CORONARY ATHEROSCLEROSIS NATIVE CORONARY ARTERY No angina symptoms status post CABG in 2009. ECG reviewed and stable. We will continue medical  therapy and observation, followup assessment of cardiac structure and function by echocardiogram. Establish more regular followup.  Aortic valve disease Patient status post pericardial AVR in 2009 with CABG. Followup echocardiogram will be obtained to reassess valve function.  Essential hypertension, benign Blood pressure is normal today.  HYPERLIPIDEMIA-MIXED Continues on TriCor and Lipitor, followed by Dr. Sherril Croon.  Bilateral leg edema Increase Lasix to total of 60 mg daily. Followup BMET in 2 weeks.    [name], M.D., F.A.C.C.

## 2014-04-05 ENCOUNTER — Telehealth: Payer: Self-pay | Admitting: *Deleted

## 2014-04-05 NOTE — Telephone Encounter (Signed)
Patient informed. 

## 2014-04-05 NOTE — Telephone Encounter (Signed)
Message copied by Eustace Moore on Tue Apr 05, 2014 10:42 AM ------      Message from: [name]      Created: Fri Apr 01, 2014 10:52 AM       Reviewed. Renal function and potassium normal after recent increase in Lasix. ------

## 2014-04-07 ENCOUNTER — Other Ambulatory Visit: Payer: Medicare Other

## 2014-04-14 ENCOUNTER — Other Ambulatory Visit: Payer: Self-pay

## 2014-04-14 ENCOUNTER — Other Ambulatory Visit (INDEPENDENT_AMBULATORY_CARE_PROVIDER_SITE_OTHER): Payer: PRIVATE HEALTH INSURANCE

## 2014-04-14 DIAGNOSIS — R0602 Shortness of breath: Secondary | ICD-10-CM

## 2014-04-14 DIAGNOSIS — I359 Nonrheumatic aortic valve disorder, unspecified: Secondary | ICD-10-CM

## 2014-04-20 ENCOUNTER — Telehealth: Payer: Self-pay | Admitting: *Deleted

## 2014-04-20 NOTE — Telephone Encounter (Signed)
Patient informed. 

## 2014-04-20 NOTE — Telephone Encounter (Signed)
Message copied by Eustace Moore on Wed Apr 20, 2014  3:19 PM ------      Message from: [name]      Created: Mon Apr 18, 2014  7:49 PM       Reviewed report. LVEF remains normal and AVR gradients are normal. Need to keep exercising and working on weight loss. ------

## 2014-09-06 ENCOUNTER — Ambulatory Visit (INDEPENDENT_AMBULATORY_CARE_PROVIDER_SITE_OTHER): Payer: Medicare Other | Admitting: Cardiology

## 2014-09-06 ENCOUNTER — Encounter: Payer: Self-pay | Admitting: Cardiology

## 2014-09-06 VITALS — BP 122/60 | HR 59 | Ht 63.0 in | Wt 277.0 lb

## 2014-09-06 DIAGNOSIS — I350 Nonrheumatic aortic (valve) stenosis: Secondary | ICD-10-CM

## 2014-09-06 DIAGNOSIS — I2510 Atherosclerotic heart disease of native coronary artery without angina pectoris: Secondary | ICD-10-CM

## 2014-09-06 DIAGNOSIS — I10 Essential (primary) hypertension: Secondary | ICD-10-CM

## 2014-09-06 NOTE — Patient Instructions (Signed)

## 2014-09-06 NOTE — Progress Notes (Signed)
Cardiology Office Note  Date: 09/06/2014   ID: SKYLLAR TORTORICE, DOB 08/17/1940, MRN [id number]  PCP: Ignatius Specking., MD  Primary Cardiologist: [name], MD   Chief Complaint  Patient presents with  . Coronary Artery Disease  . Hyperlipidemia    History of Present Illness: [name] is a 74 y.o. female last seen in September 2015 after a hiatus of 2 years. She presents for a follow-up visit today. Remains chronically short of breath, has not been able to lose any weight, although she does exercise at the [facility] 3 days a week including walking on the treadmill and the [employer] sneakers program.  We reviewed her medications, no significant changes from a cardiac perspective. I note that she is now on methotrexate for management of psoriasis.  We discussed her echocardiogram from last year, outlined below. Aortic prosthesis function was stable and LVEF remains normal range.   Past Medical History  Diagnosis Date  . Coronary atherosclerosis of native coronary artery     Multivessel  . Essential hypertension, benign   . Mixed hyperlipidemia   . Hypothyroidism   . Arthritis   . Aortic stenosis   . Atrial fibrillation     Postoperative, transiently on Coumadin  . Gout   . Psoriasis     Past Surgical History  Procedure Laterality Date  . Coronary artery bypass graft  2009    LIMA to LAD, SVG to RCA  . Aortic valve replacement  2009    21 mm Edwards Magna pericardial valve  . Cholecystectomy      2011  . Total thyroidectomy  1970  . Joint replacement  2012    L TOTAL KNEE  . Total knee arthroplasty  09/16/2011    Procedure: TOTAL KNEE ARTHROPLASTY;  Surgeon: Loanne Drilling, MD;  Location: [facility];  Service: Orthopedics;  Laterality: Right;    Current Outpatient Prescriptions  Medication Sig Dispense Refill  . aspirin 325 MG tablet Take 325 mg by mouth daily.    Marland Kitchen atorvastatin (LIPITOR) 10 MG tablet Take 10 mg by mouth daily.    Jennette Banker Sodium 30-100  MG CAPS Take 2 tablets by mouth once.     . fenofibrate (TRICOR) 145 MG tablet Take 145 mg by mouth at bedtime.     . folic acid (FOLVITE) 1 MG tablet     . furosemide (LASIX) 40 MG tablet Take 1 tablet (40 mg total) by mouth daily after breakfast. & 1/2 tablet to equal 20 mg in the evening 45 tablet 6  . levothyroxine (SYNTHROID, LEVOTHROID) 100 MCG tablet Take 100 mcg by mouth daily.    . methotrexate (RHEUMATREX) 2.5 MG tablet     . Methylcellulose, Laxative, (CITRUCEL PO) Take by mouth daily.    . metoprolol (TOPROL XL) 50 MG 24 hr tablet Take 50 mg by mouth daily after breakfast.     . Misc Natural Products (OSTEO BI-FLEX JOINT SHIELD PO) Take by mouth daily.    . Multiple Vitamins-Minerals (CENTRUM SILVER PO) Take by mouth daily.    . potassium chloride SA (K-DUR,KLOR-CON) 20 MEQ tablet Take 20 mEq by mouth daily.      . Vitamin D, Ergocalciferol, (DRISDOL) 50000 UNITS CAPS Take 50,000 Units by mouth every 7 (seven) days.     . [DISCONTINUED] ezetimibe (ZETIA) 10 MG tablet Take 10 mg by mouth daily.       No current facility-administered medications for this visit.    Allergies:  Penicillins and Statins  Social History: The patient  reports that she quit smoking about 25 years ago. She has never used smokeless tobacco. She reports that she does not drink alcohol or use illicit drugs.   ROS:  Please see the history of present illness. Otherwise, complete review of systems is positive for psoriatic arthritis.  All other systems are reviewed and negative.    Physical Exam: VS:  Ht  (1.6 m)  Wt 277 lb (125.646 kg)  BMI 49.08 kg/m2, BMI Body mass index is 49.08 kg/(m^2).  Wt Readings from Last 3 Encounters:  09/06/14 277 lb (125.646 kg)  03/16/14 273 lb (123.832 kg)  11/07/11 256 lb 6.4 oz (116.302 kg)     Morbidly obese woman in no acute distress.  HEENT: Conjunctiva and lids normal, oropharynx with moist mucosa.  Neck: Supple, no elevated venous pressure bruits.   Lungs: Diminished but clear.  Cardiac: Regular rate and rhythm with 2/6 systolic murmur, no S3 gallop.  Abdomen: Obese, nontender.  Extremity: 2+ edema right worse than left. Skin: Warm and dry. Muscular skeletal: No kyphosis. Neuropsychiatric: Alert and oriented x3, affect appropriate.   ECG: ECG is not ordered today.   Recent Labwork:  Labwork from October 2015 showed BUN 24, creatinine 1.0, potassium 4.8.  Other Studies Reviewed Today:  Echocardiogram 04/14/2014: Study Conclusions  - Procedure narrative: Transthoracic echocardiography. Image quality was poor. The study was technically difficult, as a result of poor acoustic windows, poor sound wave transmission, and body habitus. - Left ventricle: Poorly visualized. Systolic function was normal. The estimated ejection fraction was in the range of 55% to 60%. Images were inadequate for LV wall motion assessment. Diastolic dysfunction, indeterminate grade. Elevated filling pressures. Medial E/e&' 16.5. - Aortic valve: Peak velocity 2.28 m/s. There was trivial regurgitation. Mean gradient (S): 11 mm Hg. - Mitral valve: Calcified annulus. Mildly thickened leaflets . - Left atrium: The atrium was moderately dilated. - Right atrium: The atrium was mildly dilated. - Tricuspid valve: There was mild regurgitation. - Systemic veins: Poorly visualized. Possible mild dilatation. Unable to assess respiratory variation.   Assessment and Plan:  1. Multivessel CAD status post CABG with concurrent AVR in 2009. No active angina symptoms with chronic stable dyspnea on exertion. Plan to continue observation on medical therapy. I encouraged her to continue her regular exercise plan, work on portion size for weight control.  2. Aortic stenosis status post bioprosthetic AVR in 2009, stable by follow-up echocardiogram from last year.  3. Essential hypertension, blood pressure normal today.   Current medicines are  reviewed at length with the patient today.  The patient does not have concerns regarding medicines.  Disposition: FU with me in 6 months.   Signed, [name], MD, [facility] 09/06/2014 10:46 AM    [facility] [address] [place], [place], [place] [address] Phone: [phone]; Fax: [phone]

## 2015-01-10 ENCOUNTER — Other Ambulatory Visit: Payer: Self-pay | Admitting: Cardiology

## 2015-03-16 ENCOUNTER — Encounter: Payer: Self-pay | Admitting: Cardiology

## 2015-03-16 ENCOUNTER — Ambulatory Visit (INDEPENDENT_AMBULATORY_CARE_PROVIDER_SITE_OTHER): Payer: Medicare Other | Admitting: Cardiology

## 2015-03-16 VITALS — BP 142/58 | HR 72 | Ht 62.0 in | Wt 273.0 lb

## 2015-03-16 DIAGNOSIS — Z953 Presence of xenogenic heart valve: Secondary | ICD-10-CM

## 2015-03-16 DIAGNOSIS — I10 Essential (primary) hypertension: Secondary | ICD-10-CM | POA: Diagnosis not present

## 2015-03-16 DIAGNOSIS — I2510 Atherosclerotic heart disease of native coronary artery without angina pectoris: Secondary | ICD-10-CM | POA: Diagnosis not present

## 2015-03-16 DIAGNOSIS — E785 Hyperlipidemia, unspecified: Secondary | ICD-10-CM | POA: Diagnosis not present

## 2015-03-16 DIAGNOSIS — Z954 Presence of other heart-valve replacement: Secondary | ICD-10-CM

## 2015-03-16 NOTE — Patient Instructions (Signed)
Your physician recommends that you continue on your current medications as directed. Please refer to the Current Medication list given to you today. Your physician recommends that you schedule a follow-up appointment in: 6 months. You will receive a reminder letter in the mail in about 4 months reminding you to call and schedule your appointment. If you don't receive this letter, please contact our office. 

## 2015-03-16 NOTE — Progress Notes (Signed)
Cardiology Office Note  Date: 03/16/2015   ID: [name], DOB August 26, 1940, MRN [id number]  PCP: Ignatius Specking., MD  Primary Cardiologist: [name], MD   Chief Complaint  Patient presents with  . Coronary Artery Disease  . Aortic valve disease    History of Present Illness: [name] is a 74 y.o. female last seen in March.  She presents for a routine follow-up visit. Continues to exercise at the [facility] 3 days a week in the [employer] program. She has been frustrated that she has not been able to lose any weight. She does not endorse any angina symptoms, remains chronically short of breath. Follow-up ECG today shows sinus rhythm with increased voltage and nonspecific ST-T changes that are likely related to strain.   She had a follow-up echocardiogram last October showing stable bioprosthetic aortic valve function. She denies any fevers or chills.   We reviewed her medications. She did have recent lab work which is outlined below, and also plans to see Dr. Sherril Croon soon for a visit.  She has had some dependent edema in her right leg, site of previous vein harvest. She takes Lasix 40 mg the morning, previously took an extra half dose later in the day, but has not been doing this recently.  Past Medical History  Diagnosis Date  . Coronary atherosclerosis of native coronary artery     Multivessel  . Essential hypertension, benign   . Mixed hyperlipidemia   . Hypothyroidism   . Arthritis   . Aortic stenosis   . Atrial fibrillation     Postoperative, transiently on Coumadin  . Gout   . Psoriasis     Past Surgical History  Procedure Laterality Date  . Coronary artery bypass graft  2009    LIMA to LAD, SVG to RCA  . Aortic valve replacement  2009    21 mm Edwards Magna pericardial valve  . Cholecystectomy      2011  . Total thyroidectomy  1970  . Joint replacement  2012    L TOTAL KNEE  . Total knee arthroplasty  09/16/2011    Procedure: TOTAL KNEE ARTHROPLASTY;   Surgeon: Loanne Drilling, MD;  Location: [facility];  Service: Orthopedics;  Laterality: Right;    Current Outpatient Prescriptions  Medication Sig Dispense Refill  . aspirin 325 MG tablet Take 325 mg by mouth daily.    Marland Kitchen atorvastatin (LIPITOR) 10 MG tablet Take 10 mg by mouth daily.    Jennette Banker Sodium 30-100 MG CAPS Take 2 tablets by mouth once.     . fenofibrate (TRICOR) 145 MG tablet Take 145 mg by mouth at bedtime.     . folic acid (FOLVITE) 1 MG tablet Take 1 mg by mouth daily.     . furosemide (LASIX) 40 MG tablet TAKE 1 TABLET BY MOUTH AFTER BREAKFAST AND 1/2 TABLET IN THE EVENING. (DOSE INCREASE) 45 tablet 3  . levothyroxine (SYNTHROID, LEVOTHROID) 100 MCG tablet Take 100 mcg by mouth daily.    . methotrexate (RHEUMATREX) 2.5 MG tablet 2.5 mg once a week. 6 tabs weekly    . Methylcellulose, Laxative, (CITRUCEL PO) Take by mouth daily.    . metoprolol (TOPROL XL) 50 MG 24 hr tablet Take 50 mg by mouth daily after breakfast.     . Misc Natural Products (OSTEO BI-FLEX JOINT SHIELD PO) Take by mouth daily.    . Multiple Vitamins-Minerals (CENTRUM SILVER PO) Take by mouth daily.    . potassium  chloride SA (K-DUR,KLOR-CON) 20 MEQ tablet Take 20 mEq by mouth daily.      . Vitamin D, Ergocalciferol, (DRISDOL) 50000 UNITS CAPS Take 50,000 Units by mouth every 7 (seven) days.     . [DISCONTINUED] ezetimibe (ZETIA) 10 MG tablet Take 10 mg by mouth daily.       No current facility-administered medications for this visit.    Allergies:  Penicillins and Statins   Social History: The patient  reports that she quit smoking about 25 years ago. She has never used smokeless tobacco. She reports that she does not drink alcohol or use illicit drugs.   ROS:  Please see the history of present illness. Otherwise, complete review of systems is positive for  arthritis.  All other systems are reviewed and negative.   Physical Exam: VS:  BP 142/58 mmHg  Pulse 72  Ht  (1.575 m)  Wt 273  lb (123.832 kg)  BMI 49.92 kg/m2  SpO2 91%, BMI Body mass index is 49.92 kg/(m^2).  Wt Readings from Last 3 Encounters:  03/16/15 273 lb (123.832 kg)  09/06/14 277 lb (125.646 kg)  03/16/14 273 lb (123.832 kg)     Morbidly obese woman in no acute distress.  HEENT: Conjunctiva and lids normal, oropharynx with moist mucosa.  Neck: Supple, no elevated venous pressure bruits.  Lungs: Diminished but clear.  Cardiac: Regular rate and rhythm with 2/6 systolic murmur, no S3 gallop.  Abdomen: Obese, nontender.  Extremity: 1-2+ edema right worse than left. Skin: Warm and dry. Muscular skeletal: No kyphosis. Neuropsychiatric: Alert and oriented x3, affect appropriate.   ECG: ECG is ordered today.   Recent Labwork:  September 2016: Hgb 14.1, platelets 257, BUN 14, creatinine 0.95, potassium 5.2, AST 24, ALT 10.  Other Studies Reviewed Today:  Echocardiogram 04/14/2014: Study Conclusions  - Procedure narrative: Transthoracic echocardiography. Image quality was poor. The study was technically difficult, as a result of poor acoustic windows, poor sound wave transmission, and body habitus. - Left ventricle: Poorly visualized. Systolic function was normal. The estimated ejection fraction was in the range of 55% to 60%. Images were inadequate for LV wall motion assessment. Diastolic dysfunction, indeterminate grade. Elevated filling pressures. Medial E/e&' 16.5. - Aortic valve: Peak velocity 2.28 m/s. There was trivial regurgitation. Mean gradient (S): 11 mm Hg. - Mitral valve: Calcified annulus. Mildly thickened leaflets . - Left atrium: The atrium was moderately dilated. - Right atrium: The atrium was mildly dilated. - Tricuspid valve: There was mild regurgitation. - Systemic veins: Poorly visualized. Possible mild dilatation. Unable to assess respiratory variation.  Assessment and Plan:  1. Multivessel CAD status post CABG in 2009. She is doing well  without angina symptoms , exercises 3 days a week as outlined. ECG is stable. We will continue observation for now, can discuss follow-up ischemic testing over the next year.  2. Bioprosthetic AVR , stable by echocardiogram from last year. No change on examination.  3. Mixed hyperlipidemia on multimodal therapy. Keep follow-up with Dr. Sherril Croon for lab work.  4. Essential hypertension, no change in current regimen. Maintain exercise plan and continue to work toward weight loss.  Current medicines were reviewed with the patient today.   Orders Placed This Encounter  Procedures  . EKG 12-Lead    Disposition: FU with me in 6 months.   Signed, [name], MD, [facility] 03/16/2015 9:06 AM    [facility] at [facility] [address] [place], [place], [place] [address] Phone: [phone]; Fax: [phone]  

## 2015-04-27 ENCOUNTER — Encounter: Payer: Self-pay | Admitting: Cardiology

## 2015-04-27 ENCOUNTER — Ambulatory Visit (INDEPENDENT_AMBULATORY_CARE_PROVIDER_SITE_OTHER): Payer: Medicare Other | Admitting: Cardiology

## 2015-04-27 VITALS — BP 122/70 | HR 63 | Ht 62.0 in | Wt 273.0 lb

## 2015-04-27 DIAGNOSIS — I10 Essential (primary) hypertension: Secondary | ICD-10-CM

## 2015-04-27 DIAGNOSIS — R600 Localized edema: Secondary | ICD-10-CM

## 2015-04-27 DIAGNOSIS — Z954 Presence of other heart-valve replacement: Secondary | ICD-10-CM

## 2015-04-27 DIAGNOSIS — G4733 Obstructive sleep apnea (adult) (pediatric): Secondary | ICD-10-CM | POA: Diagnosis not present

## 2015-04-27 DIAGNOSIS — I2510 Atherosclerotic heart disease of native coronary artery without angina pectoris: Secondary | ICD-10-CM

## 2015-04-27 DIAGNOSIS — Z952 Presence of prosthetic heart valve: Secondary | ICD-10-CM

## 2015-04-27 MED ORDER — FUROSEMIDE 40 MG PO TABS: ORAL_TABLET | ORAL | Status: DC

## 2015-04-27 NOTE — Progress Notes (Signed)
Cardiology Office Note  Date: 04/27/2015   ID: [name], DOB 1941-04-29, MRN [id number]  PCP: Ignatius Specking., MD  Primary Cardiologist: [name], MD   Chief Complaint  Patient presents with  . Coronary Artery Disease  . Status post AVR    History of Present Illness: [name] is a 74 y.o. female last seen in September. She states that since I saw her she has had more trouble with leg edema, indicates that she was treated with antibiotics and steroids for cellulitis involving the left leg. She has also required intensification of her Lasix dose to 80 mg total a day by Dr. Sherril Croon.  She is here today with a friend for follow-up. Complains of poor energy, there are concerns that she may have sleep apnea and a sleep study was recommended by Dr. Sherril Croon. She has been hesitant to pursue this. We discussed her diet, she eats out quite a lot and does not necessarily track her sodium intake. Most of her leg swelling is on the right status post vein harvesting.  Weight is stable compared to September. She states that she had gained weight however since I saw her and lost at least 7 pounds after the increase in her Lasix dose.  She did have an echocardiogram done last October which is outlined below indicating normal LVEF and prosthetic aortic valve function.  Past Medical History  Diagnosis Date  . Coronary atherosclerosis of native coronary artery     Multivessel  . Essential hypertension, benign   . Mixed hyperlipidemia   . Hypothyroidism   . Arthritis   . Aortic stenosis   . Atrial fibrillation (HCC)     Postoperative, transiently on Coumadin  . Gout   . Psoriasis     Past Surgical History  Procedure Laterality Date  . Coronary artery bypass graft  2009    LIMA to LAD, SVG to RCA  . Aortic valve replacement  2009    21 mm Edwards Magna pericardial valve  . Cholecystectomy      2011  . Total thyroidectomy  1970  . Joint replacement  2012    L TOTAL KNEE  . Total  knee arthroplasty  09/16/2011    Procedure: TOTAL KNEE ARTHROPLASTY;  Surgeon: Loanne Drilling, MD;  Location: [facility];  Service: Orthopedics;  Laterality: Right;    Current Outpatient Prescriptions  Medication Sig Dispense Refill  . aspirin 325 MG tablet Take 325 mg by mouth daily.    Marland Kitchen atorvastatin (LIPITOR) 10 MG tablet Take 10 mg by mouth daily.    Jennette Banker Sodium 30-100 MG CAPS Take 2 tablets by mouth once.     . fenofibrate (TRICOR) 145 MG tablet Take 145 mg by mouth at bedtime.     . folic acid (FOLVITE) 1 MG tablet Take 1 mg by mouth daily.     . furosemide (LASIX) 40 MG tablet Take 80 mg by mouth daily.    Marland Kitchen levothyroxine (SYNTHROID, LEVOTHROID) 100 MCG tablet Take 100 mcg by mouth daily.    . methotrexate (RHEUMATREX) 2.5 MG tablet 2.5 mg once a week. 6 tabs weekly    . Methylcellulose, Laxative, (CITRUCEL PO) Take by mouth daily.    . metoprolol (TOPROL XL) 50 MG 24 hr tablet Take 50 mg by mouth daily after breakfast.     . Misc Natural Products (OSTEO BI-FLEX JOINT SHIELD PO) Take by mouth daily.    . Multiple Vitamins-Minerals (CENTRUM SILVER PO) Take by mouth  daily.    . potassium chloride SA (K-DUR,KLOR-CON) 20 MEQ tablet Take 20 mEq by mouth daily.      . Vitamin D, Ergocalciferol, (DRISDOL) 50000 UNITS CAPS Take 50,000 Units by mouth every 7 (seven) days.     . [DISCONTINUED] ezetimibe (ZETIA) 10 MG tablet Take 10 mg by mouth daily.       No current facility-administered medications for this visit.    Allergies:  Penicillins and Statins   Social History: The patient  reports that she quit smoking about 25 years ago. She has never used smokeless tobacco. She reports that she does not drink alcohol or use illicit drugs.   ROS:  Please see the history of present illness. Otherwise, complete review of systems is positive for chronic orthopnea, sleeps in a chair.  All other systems are reviewed and negative.   Physical Exam: VS:  BP 122/70 mmHg  Pulse 63  Ht   (1.575 m)  Wt 273 lb (123.832 kg)  BMI 49.92 kg/m2  SpO2 91%, BMI Body mass index is 49.92 kg/(m^2).  Wt Readings from Last 3 Encounters:  04/27/15 273 lb (123.832 kg)  03/16/15 273 lb (123.832 kg)  09/06/14 277 lb (125.646 kg)     Morbidly obese woman in no acute distress.  HEENT: Conjunctiva and lids normal, oropharynx with moist mucosa.  Neck: Supple, no elevated venous pressure bruits.  Lungs: Diminished but clear.  Cardiac: Regular rate and rhythm with 2/6 systolic murmur, no S3 gallop.  Abdomen: Obese, nontender.  Extremity: 1-2+ edema right worse than left. Skin: Warm and dry. Musculoskeletal: No kyphosis. Neuropsychiatric: Alert and oriented x3, affect appropriate.   ECG: ECG is not ordered today.   Recent Labwork:  September 2016: Hgb 14.1, platelets 257, BUN 14, creatinine 0.95, potassium 5.2, AST 24, ALT 10.  Other Studies Reviewed Today:  Echocardiogram 04/14/2014: Study Conclusions  - Procedure narrative: Transthoracic echocardiography. Image quality was poor. The study was technically difficult, as a result of poor acoustic windows, poor sound wave transmission, and body habitus. - Left ventricle: Poorly visualized. Systolic function was normal. The estimated ejection fraction was in the range of 55% to 60%. Images were inadequate for LV wall motion assessment. Diastolic dysfunction, indeterminate grade. Elevated filling pressures. Medial E/e&' 16.5. - Aortic valve: Peak velocity 2.28 m/s. There was trivial regurgitation. Mean gradient (S): 11 mm Hg. - Mitral valve: Calcified annulus. Mildly thickened leaflets . - Left atrium: The atrium was moderately dilated. - Right atrium: The atrium was mildly dilated. - Tricuspid valve: There was mild regurgitation. - Systemic veins: Poorly visualized. Possible mild dilatation. Unable to assess respiratory variation.   Assessment and Plan:  1. Recurrent leg edema, right greater  than left. Some of this is due to prior vein harvesting with CABG. Also expect a component of unregulated sodium intake. We did discuss her diet today. With concerns about sleep apnea, I would agree with referral for sleep study, we will arrange this through our pulmonary division. She may have RV dysfunction contributing, follow-up echocardiogram will be obtained. For now Lasix will be increased to 60 mg the morning and 40 mg in the afternoon with follow-up BMET for her next visit.  2. History of aortic stenosis status post bioprosthetic aortic valve replacement in 2009. Valve function was normal last year.  3. Multivessel CAD status post CABG in 2009, no active angina symptoms.  4. Essential hypertension, blood pressure is normal today.  Current medicines were reviewed with the patient today.  Orders Placed This Encounter  Procedures  . Ambulatory referral to Pulmonology  . ECHOCARDIOGRAM COMPLETE    Disposition: FU with me in 1 month.   Signed, [name], MD, [facility] 04/27/2015 8:55 AM    [facility] at [facility] [address] [place], [place], [place] [address] Phone: [phone]; Fax: [phone]

## 2015-04-27 NOTE — Patient Instructions (Signed)
Your physician has requested that you have an echocardiogram. Echocardiography is a painless test that uses sound waves to create images of your heart. It provides your doctor with information about the size and shape of your heart and how well your heart's chambers and valves are working. This procedure takes approximately one hour. There are no restrictions for this procedure. Referral to Pulmonology for evaluation of sleep apnea. Increase Lasix to 60mg  every morning & 40mg  every evening. Continue all other medications.   Follow up in  1 month  Lab for BMET just prior to next visit - order given today.

## 2015-05-03 ENCOUNTER — Ambulatory Visit (INDEPENDENT_AMBULATORY_CARE_PROVIDER_SITE_OTHER): Payer: Medicare Other

## 2015-05-03 ENCOUNTER — Other Ambulatory Visit: Payer: Self-pay

## 2015-05-03 DIAGNOSIS — R600 Localized edema: Secondary | ICD-10-CM | POA: Diagnosis not present

## 2015-05-03 DIAGNOSIS — Z954 Presence of other heart-valve replacement: Secondary | ICD-10-CM

## 2015-05-03 DIAGNOSIS — Z952 Presence of prosthetic heart valve: Secondary | ICD-10-CM

## 2015-05-04 ENCOUNTER — Other Ambulatory Visit (HOSPITAL_COMMUNITY): Payer: Self-pay | Admitting: Respiratory Therapy

## 2015-05-04 DIAGNOSIS — G4730 Sleep apnea, unspecified: Secondary | ICD-10-CM

## 2015-05-08 ENCOUNTER — Telehealth: Payer: Self-pay | Admitting: *Deleted

## 2015-05-08 NOTE — Telephone Encounter (Signed)
-----   Message from [name], MD sent at 05/04/2015  9:31 AM EST ----- Reviewed report. LVEF remains normal, she does have grade 2 diastolic dysfunction with increased filling pressures which could be contributing to leg edema. Also mildly reduced right ventricular contraction. Aortic valve prosthesis is working normally. We increased her Lasix at the recent visit, continue with current plan for now.

## 2015-05-08 NOTE — Telephone Encounter (Signed)
Patient informed. 

## 2015-05-11 ENCOUNTER — Other Ambulatory Visit: Payer: Medicare Other

## 2015-05-30 ENCOUNTER — Ambulatory Visit: Payer: Medicare Other | Admitting: Cardiology

## 2015-06-05 ENCOUNTER — Encounter: Payer: Self-pay | Admitting: Cardiology

## 2015-06-05 ENCOUNTER — Ambulatory Visit (INDEPENDENT_AMBULATORY_CARE_PROVIDER_SITE_OTHER): Payer: Medicare Other | Admitting: Cardiology

## 2015-06-05 ENCOUNTER — Other Ambulatory Visit: Payer: Self-pay | Admitting: *Deleted

## 2015-06-05 VITALS — BP 146/80 | HR 61 | Ht 62.0 in | Wt 274.6 lb

## 2015-06-05 DIAGNOSIS — I10 Essential (primary) hypertension: Secondary | ICD-10-CM | POA: Diagnosis not present

## 2015-06-05 DIAGNOSIS — Z954 Presence of other heart-valve replacement: Secondary | ICD-10-CM

## 2015-06-05 DIAGNOSIS — Z953 Presence of xenogenic heart valve: Secondary | ICD-10-CM

## 2015-06-05 DIAGNOSIS — I2510 Atherosclerotic heart disease of native coronary artery without angina pectoris: Secondary | ICD-10-CM

## 2015-06-05 DIAGNOSIS — R600 Localized edema: Secondary | ICD-10-CM

## 2015-06-05 MED ORDER — FUROSEMIDE 40 MG PO TABS: 60.0000 mg | ORAL_TABLET | Freq: Two times a day (BID) | ORAL | Status: DC

## 2015-06-05 MED ORDER — POTASSIUM CHLORIDE CRYS ER 20 MEQ PO TBCR: 20.0000 meq | EXTENDED_RELEASE_TABLET | ORAL | Status: DC

## 2015-06-05 NOTE — Patient Instructions (Signed)
Your physician has recommended you make the following change in your medication:  Increase your furosemide to 60 mg twice daily. Please take 1 &1/2 of your 40 mg tablets twice daily. Increase your potassium chloride to 20 meq alternating with 40 meq every other day. Continue all other medications the same. Your physician recommends that you return for lab work in a few weeks around the first week of January 2017 to check your BMET. Your physician recommends that you schedule a follow-up appointment in: 2 months.

## 2015-06-05 NOTE — Progress Notes (Signed)
Cardiology Office Note  Date: 06/05/2015   ID: [name], DOB 24-Dec-1940, MRN [id number]  PCP: Ignatius Specking., MD  Primary Cardiologist: [name], MD   Chief Complaint  Patient presents with  . Follow-up leg edema  . Status post AVR    History of Present Illness: [name] is a 74 y.o. female last seen in November. At the last visit Lasix was increased to 60 mg in the morning and 40 mg in the evening and a follow-up echocardiogram was obtained. There has been no significant change in LVEF, although she does have grade 2 diastolic dysfunction and mildly reduced RV contraction. He has not had much change in her weight, leg edema is perhaps a little bit better. She is seeing Dr. Juanetta [name] for pulmonary evaluation. She tells me that she did have ambulatory oxygen desaturation, is now on MDI, also has plans for sleep study in January.  We reviewed medications which are outlined below. Plan will be to further increase Lasix with potassium supplementation. We will get a BMET in the next few weeks.  Past Medical History  Diagnosis Date  . Coronary atherosclerosis of native coronary artery     Multivessel  . Essential hypertension, benign   . Mixed hyperlipidemia   . Hypothyroidism   . Arthritis   . Aortic stenosis   . Atrial fibrillation (HCC)     Postoperative, transiently on Coumadin  . Gout   . Psoriasis     Current Outpatient Prescriptions  Medication Sig Dispense Refill  . aspirin 325 MG tablet Take 325 mg by mouth daily.    Marland Kitchen atorvastatin (LIPITOR) 10 MG tablet Take 10 mg by mouth daily.    Jennette Banker Sodium 30-100 MG CAPS Take 2 tablets by mouth once.     . fenofibrate (TRICOR) 145 MG tablet Take 145 mg by mouth at bedtime.     . folic acid (FOLVITE) 1 MG tablet Take 1 mg by mouth daily.     . furosemide (LASIX) 40 MG tablet Take 1.5 tablets (60 mg total) by mouth 2 (two) times daily. 60 tablet 3  . levothyroxine (SYNTHROID, LEVOTHROID) 100 MCG  tablet Take 100 mcg by mouth daily.    . methotrexate (RHEUMATREX) 2.5 MG tablet 2.5 mg once a week. 6 tabs weekly    . Methylcellulose, Laxative, (CITRUCEL PO) Take by mouth daily.    . metoprolol (TOPROL XL) 50 MG 24 hr tablet Take 50 mg by mouth daily after breakfast.     . Misc Natural Products (OSTEO BI-FLEX JOINT SHIELD PO) Take by mouth daily.    . Multiple Vitamins-Minerals (CENTRUM SILVER PO) Take by mouth daily.    . potassium chloride SA (K-DUR,KLOR-CON) 20 MEQ tablet Take 1-2 tablets (20-40 mEq total) by mouth every other day. Alternate 20 meq with 40 meq every other day 45 tablet 3  . Vitamin D, Ergocalciferol, (DRISDOL) 50000 UNITS CAPS Take 50,000 Units by mouth every 7 (seven) days.     . [DISCONTINUED] ezetimibe (ZETIA) 10 MG tablet Take 10 mg by mouth daily.       No current facility-administered medications for this visit.   Allergies:  Penicillins and Statins   Social History: The patient  reports that she quit smoking about 25 years ago. She has never used smokeless tobacco. She reports that she does not drink alcohol or use illicit drugs.   ROS:  Please see the history of present illness. Otherwise, complete review of systems is positive for  arthritic pains.  All other systems are reviewed and negative.   Physical Exam: VS:  BP 146/80 mmHg  Pulse 61  Ht 5\' 2"  (1.575 m)  Wt 274 lb 9.6 oz (124.558 kg)  BMI 50.21 kg/m2  SpO2 93%, BMI Body mass index is 50.21 kg/(m^2).  Wt Readings from Last 3 Encounters:  06/05/15 274 lb 9.6 oz (124.558 kg)  04/27/15 273 lb (123.832 kg)  03/16/15 273 lb (123.832 kg)    Morbidly obese woman in no acute distress.  HEENT: Conjunctiva and lids normal, oropharynx with moist mucosa.  Neck: Supple, no elevated venous pressure bruits.  Lungs: Diminished but clear.  Cardiac: Regular rate and rhythm with 2/6 systolic murmur, no S3 gallop.  Abdomen: Obese, nontender.  Extremity: 1-2+ edema right worse than left.  ECG: Tracing from  03/14/2015 showed sinus rhythm with R' in lead V1 and V2, increased voltage, nonspecific ST changes.  Recent Labwork:  December 2016: BUN 17, creatinine 0.9, sodium 143, potassium 3.8   Other Studies Reviewed Today:  Echocardiogram 05/25/2015: Study Conclusions  - Procedure narrative: Transthoracic echocardiography for left ventricular function evaluation, for right ventricular function evaluation, and for assessment of valvular function. Image quality was adequate. The study was technically difficult, as a result of poor acoustic windows and body habitus. - Left ventricle: The cavity size was normal. There was moderate concentric hypertrophy. Systolic function was normal. The estimated ejection fraction was in the range of 60% to 65%. Images were inadequate for LV wall motion assessment. Features are consistent with a pseudonormal left ventricular filling pattern, with concomitant abnormal relaxation and increased filling pressure (grade 2 diastolic dysfunction). Doppler parameters are consistent with high ventricular filling pressure. - Aortic valve: Bioprosthetic aortic valve as per report, which appears to be functioning normally. There was trivial regurgitation. Peak velocity (S): 264 cm/s. Mean gradient (S): 14 mm Hg. Valve area (VTI): 2.3 cm^2. Valve area (Vmax): 2.09 cm^2. Valve area (Vmean): 2.46 cm^2. - Mitral valve: Calcified annulus. Mildly calcified leaflets . - Left atrium: The atrium was moderately to severely dilated. - Right ventricle: Systolic function was mildly reduced. - Tricuspid valve: Incomplete tricuspid regurgitant jet. Thus, accurate assessment of pulmonary pressures could not be made.  Assessment and Plan:  1. Bilateral leg edema in the setting of grade 2 diastolic dysfunction and mild RV dysfunction. Likely multifactorial with pulmonary workup ongoing as well. We will increase Lasix to 60 mg twice daily, KCL to 20 mEq  alternating with 40 mEq every other day. Follow-up BMET in few weeks.  2. Multivessel CAD status post CABG with no active angina symptoms. LVEF remains normal range.  3. Aortic stenosis status post bioprosthetic AVR. Valve function normal by recent echocardiogram with only trivial aortic regurgitation and mean gradient 14 mmHg.  Current medicines were reviewed with the patient today.   Orders Placed This Encounter  Procedures  . Basic metabolic panel    Disposition: FU with me in 2 months.   Signed, [name], MD, [facility] 06/05/2015 10:34 AM    [facility] at [facility] [address] [place], [place], [place] [address] Phone: [phone]; Fax: [phone]

## 2015-08-10 ENCOUNTER — Ambulatory Visit: Payer: Medicare Other | Admitting: Cardiology

## 2015-09-13 ENCOUNTER — Telehealth: Payer: Self-pay | Admitting: Cardiology

## 2015-09-13 NOTE — Telephone Encounter (Signed)
Request sent by fax this morning.

## 2016-11-29 ENCOUNTER — Encounter: Payer: Self-pay | Admitting: *Deleted

## 2016-12-01 NOTE — Progress Notes (Signed)
Cardiology Office Note  Date: 12/02/2016   ID: [name], DOB 10-15-1940, MRN [id number]  PCP: April Specking, MD  Primary Cardiologist: April Dell, MD   Chief Complaint  Patient presents with  . Coronary Artery Disease    History of Present Illness: [name] is a 76 y.o. female last seen in December 2016. She presents overdue for a follow-up visit. Fortunately, states that she has been doing very well. She has retired, spends time playing bridge in several groups, also exercises at the [facility] in the [employer]. She does not report any angina symptoms. She has stable NYHA class II dyspnea. No major change in chronic leg edema.  Echocardiogram from December 2016 is noted below. Bioprosthetic AVR function was normal with mean gradient 14 mmHg.  She continues to follow with Dr. Sherril [name]. She states she had recent lab work which we are requesting.  I personally reviewed her ECG today which shows sinus bradycardia with right bundle branch block, possible old lateral infarct pattern.  Past Medical History:  Diagnosis Date  . Aortic stenosis   . Arthritis   . Atrial fibrillation (HCC)    Postoperative, transiently on Coumadin  . Coronary atherosclerosis of native coronary artery    Multivessel  . Essential hypertension, benign   . Gout   . Hypothyroidism   . Mixed hyperlipidemia   . Psoriasis     Past Surgical History:  Procedure Laterality Date  . AORTIC VALVE REPLACEMENT  2009   21 mm [facility] pericardial valve  . CHOLECYSTECTOMY     2011  . CORONARY ARTERY BYPASS GRAFT  2009   LIMA to LAD, SVG to RCA  . JOINT REPLACEMENT  2012   L TOTAL KNEE  . TOTAL KNEE ARTHROPLASTY  09/16/2011   Procedure: TOTAL KNEE ARTHROPLASTY;  Surgeon: Loanne Drilling, MD;  Location: [facility];  Service: Orthopedics;  Laterality: Right;  . TOTAL THYROIDECTOMY  1970    Current Outpatient Prescriptions  Medication Sig Dispense Refill  . allopurinol (ZYLOPRIM) 300 MG tablet  Take 300 mg by mouth daily.  3  . aspirin EC 81 MG tablet Take 81 mg by mouth daily.    Marland Kitchen atorvastatin (LIPITOR) 10 MG tablet Take 10 mg by mouth daily.    Jennette Banker Sodium 30-100 MG CAPS Take 2 tablets by mouth once.     . docusate sodium (COLACE) 100 MG capsule Take 100 mg by mouth daily.    . folic acid (FOLVITE) 1 MG tablet Take 1 mg by mouth daily.     . furosemide (LASIX) 40 MG tablet Take 1.5 tablets (60 mg total) by mouth 2 (two) times daily. 60 tablet 3  . levothyroxine (SYNTHROID, LEVOTHROID) 88 MCG tablet Take 88 mcg by mouth daily.  4  . LINZESS 145 MCG CAPS capsule Take 145 mcg by mouth daily.  5  . losartan (COZAAR) 50 MG tablet Take 25 mg by mouth 2 (two) times daily.  3  . methotrexate (RHEUMATREX) 2.5 MG tablet 2.5 mg once a week. 6 tabs weekly    . Methylcellulose, Laxative, (CITRUCEL PO) Take by mouth daily.    . metoprolol (TOPROL XL) 50 MG 24 hr tablet Take 50 mg by mouth daily after breakfast.     . Misc Natural Products (OSTEO BI-FLEX JOINT SHIELD PO) Take by mouth daily.    . Multiple Vitamins-Minerals (CENTRUM SILVER PO) Take by mouth daily.    . pantoprazole (PROTONIX) 40 MG tablet Take  40 mg by mouth daily.  4  . potassium chloride SA (K-DUR,KLOR-CON) 20 MEQ tablet Take 1-2 tablets (20-40 mEq total) by mouth every other day. Alternate 20 meq with 40 meq every other day 45 tablet 3  . Vitamin D, Ergocalciferol, (DRISDOL) 50000 UNITS CAPS Take 50,000 Units by mouth every 7 (seven) days.      No current facility-administered medications for this visit.    Allergies:  Penicillins and Statins   Social History: The patient  reports that she quit smoking about 27 years ago. She has never used smokeless tobacco. She reports that she does not drink alcohol or use drugs.   ROS:  Please see the history of present illness. Otherwise, complete review of systems is positive for chronic leg swelling, right greater than left.  All other systems are reviewed and  negative.   Physical Exam: VS:  BP 126/72   Pulse (!) 57   Ht 5\' 3"  (1.6 m)   Wt 262 lb 9.6 oz (119.1 kg)   LMP  (LMP Unknown)   BMI 46.52 kg/m , BMI Body mass index is 46.52 kg/m.  Wt Readings from Last 3 Encounters:  12/02/16 262 lb 9.6 oz (119.1 kg)  06/05/15 274 lb 9.6 oz (124.6 kg)  04/27/15 273 lb (123.8 kg)    Morbidly obese woman in no acute distress.  HEENT: Conjunctiva and lids normal, oropharynx with moist mucosa.  Neck: Supple, no elevated venous pressure bruits.  Lungs: Diminished but clear.  Cardiac: Regular rate and rhythm with 2/6 systolic murmur, no S3 gallop.  Abdomen: Obese, nontender.  Extremity: 1-2+ edema right worse than left. Skin: Warm and dry. Musculoskeletal: No kyphosis. Neuropsychiatric: Alert and oriented 3, affect appropriate.  ECG: I personally reviewed the tracing from 03/14/2015 showed sinus rhythm with R' in lead V1 and V2, increased voltage, nonspecific ST changes.  Recent Labwork:  December 2016: BUN 17, creatinine 0.92, potassium 3.8  Other Studies Reviewed Today:  Echocardiogram 05/25/2015: Study Conclusions  - Procedure narrative: Transthoracic echocardiography for left   ventricular function evaluation, for right ventricular function   evaluation, and for assessment of valvular function. Image   quality was adequate. The study was technically difficult, as a   result of poor acoustic windows and body habitus. - Left ventricle: The cavity size was normal. There was moderate   concentric hypertrophy. Systolic function was normal. The   estimated ejection fraction was in the range of 60% to 65%.   Images were inadequate for LV wall motion assessment. Features   are consistent with a pseudonormal left ventricular filling   pattern, with concomitant abnormal relaxation and increased   filling pressure (grade 2 diastolic dysfunction). Doppler   parameters are consistent with high ventricular filling pressure. - Aortic valve:  Bioprosthetic aortic valve as per report, which   appears to be functioning normally. There was trivial   regurgitation. Peak velocity (S): 264 cm/s. Mean gradient (S): 14   mm Hg. Valve area (VTI): 2.3 cm^2. Valve area (Vmax): 2.09 cm^2.   Valve area (Vmean): 2.46 cm^2. - Mitral valve: Calcified annulus. Mildly calcified leaflets . - Left atrium: The atrium was moderately to severely dilated. - Right ventricle: Systolic function was mildly reduced. - Tricuspid valve: Incomplete tricuspid regurgitant jet. Thus,   accurate assessment of pulmonary pressures could not be made.  Assessment and Plan:  1. CAD status post CABG in 2009 with AVR. She is doing very well with no angina symptoms. ECG reviewed and stable. Continue medical  therapy and observation for now.  2. Aortic stenosis status post bioprosthetic AVR in 2009, grossly normal valve function by echocardiogram in 2016. No significant change on examination.  3. Moderate diastolic dysfunction and RV dysfunction with chronic leg edema. Continue with present regimen including Lasix and potassium supplements. Weight is down 12 pounds compared to last visit.  4. Essential hypertension, blood pressure control is adequate today.  Current medicines were reviewed with the patient today.   Orders Placed This Encounter  Procedures  . EKG 12-Lead    Disposition: Follow-up in one year.  Signed, [name], MD, [facility] 12/02/2016 11:28 AM    [facility] at [facility] [address] [place], [place], [place] [address] Phone: [phone]; Fax: [phone]

## 2016-12-02 ENCOUNTER — Ambulatory Visit (INDEPENDENT_AMBULATORY_CARE_PROVIDER_SITE_OTHER): Payer: Medicare Other | Admitting: Cardiology

## 2016-12-02 ENCOUNTER — Encounter: Payer: Self-pay | Admitting: Cardiology

## 2016-12-02 ENCOUNTER — Encounter: Payer: Self-pay | Admitting: *Deleted

## 2016-12-02 VITALS — BP 126/72 | HR 57 | Ht 63.0 in | Wt 262.6 lb

## 2016-12-02 DIAGNOSIS — I10 Essential (primary) hypertension: Secondary | ICD-10-CM | POA: Diagnosis not present

## 2016-12-02 DIAGNOSIS — I5032 Chronic diastolic (congestive) heart failure: Secondary | ICD-10-CM

## 2016-12-02 DIAGNOSIS — I2510 Atherosclerotic heart disease of native coronary artery without angina pectoris: Secondary | ICD-10-CM

## 2016-12-02 DIAGNOSIS — Z953 Presence of xenogenic heart valve: Secondary | ICD-10-CM

## 2016-12-02 NOTE — Patient Instructions (Signed)

## 2017-12-14 NOTE — Progress Notes (Signed)
Cardiology Office Note  Date: 12/15/2017   ID: [name], DOB 01/20/1941, MRN [id number]  PCP: Ignatius Specking, MD  Primary Cardiologist: [name], MD   Chief Complaint  Patient presents with  . Coronary Artery Disease    History of Present Illness: [name] is a 77 y.o. female last seen in June 2018.  She presents for a routine visit.  Reports no major change since last visit, no angina symptoms, but does report some dyspnea on exertion.  She does the [employer] program at [employer], can do all the exercises except walking for 15 minutes.  She continues with chronic leg edema, right worse than left.  Her Lasix dose has been increased.  Echocardiogram from December 2016 is reviewed below.  LVEF was normal at 60 to 65% with moderate diastolic dysfunction.  Bioprosthetic AVR showed grossly normal function with trivial aortic regurgitation.  I discussed this with her today.  Does have mildly reduced right ventricular contraction which is likely also contributing to her leg swelling.  She has lost weight since I saw her, attributes this to diet and also more aggressive thyroid management.  She has not undergone ischemic testing recently.  Past Medical History:  Diagnosis Date  . Aortic stenosis   . Arthritis   . Atrial fibrillation (HCC)    Postoperative, transiently on Coumadin  . Coronary atherosclerosis of native coronary artery    Multivessel  . Essential hypertension, benign   . Gout   . Hypothyroidism   . Mixed hyperlipidemia   . Psoriasis     Past Surgical History:  Procedure Laterality Date  . AORTIC VALVE REPLACEMENT  2009   21 mm [facility] pericardial valve  . CHOLECYSTECTOMY     2011  . CORONARY ARTERY BYPASS GRAFT  2009   LIMA to LAD, SVG to RCA  . JOINT REPLACEMENT  2012   L TOTAL KNEE  . TOTAL KNEE ARTHROPLASTY  09/16/2011   Procedure: TOTAL KNEE ARTHROPLASTY;  Surgeon: Loanne Drilling, MD;  Location: [facility];  Service: Orthopedics;   Laterality: Right;  . TOTAL THYROIDECTOMY  1970    Current Outpatient Medications  Medication Sig Dispense Refill  . allopurinol (ZYLOPRIM) 300 MG tablet Take 300 mg by mouth daily.  3  . aspirin EC 81 MG tablet Take 81 mg by mouth daily.    Marland Kitchen atorvastatin (LIPITOR) 10 MG tablet Take 10 mg by mouth daily.    Marland Kitchen docusate sodium (COLACE) 100 MG capsule Take 100 mg by mouth daily.    . ferrous sulfate 325 (65 FE) MG EC tablet Take 325 mg by mouth daily.    . furosemide (LASIX) 40 MG tablet Take 1.5 tablets (60 mg total) by mouth 2 (two) times daily. 60 tablet 3  . levothyroxine (SYNTHROID, LEVOTHROID) 125 MCG tablet Take 125 mcg by mouth daily before breakfast.    . LINZESS 145 MCG CAPS capsule Take 145 mcg by mouth daily.  5  . losartan (COZAAR) 50 MG tablet Take 25 mg by mouth 2 (two) times daily.  3  . metoprolol (TOPROL XL) 50 MG 24 hr tablet Take 50 mg by mouth daily after breakfast.     . Multiple Vitamins-Minerals (CENTRUM SILVER PO) Take by mouth daily.    . pantoprazole (PROTONIX) 40 MG tablet Take 40 mg by mouth daily.  4  . potassium chloride SA (K-DUR,KLOR-CON) 20 MEQ tablet Take 20 mEq by mouth daily.    . Vitamin D,  Ergocalciferol, (DRISDOL) 50000 UNITS CAPS Take 50,000 Units by mouth every 7 (seven) days.      No current facility-administered medications for this visit.    Allergies:  Penicillins and Statins   Social History: The patient  reports that she quit smoking about 28 years ago. She has never used smokeless tobacco. She reports that she does not drink alcohol or use drugs.   ROS:  Please see the history of present illness. Otherwise, complete review of systems is positive for arthritic pains.  All other systems are reviewed and negative.   Physical Exam: VS:  BP 106/66   Pulse 76   Ht 5\' 3"  (1.6 m)   Wt 248 lb 6.4 oz (112.7 kg)   LMP  (LMP Unknown)   SpO2 95%   BMI 44.00 kg/m , BMI Body mass index is 44 kg/m.  Wt Readings from Last 3 Encounters:  12/15/17  248 lb 6.4 oz (112.7 kg)  12/02/16 262 lb 9.6 oz (119.1 kg)  06/05/15 274 lb 9.6 oz (124.6 kg)    General: Patient appears comfortable at rest. HEENT: Conjunctiva and lids normal, oropharynx clear. Neck: Supple, no elevated JVP or carotid bruits, no thyromegaly. Lungs: Clear to auscultation, nonlabored breathing at rest. Cardiac: Regular rate and rhythm, no S3, 2/6 systolic murmur. Abdomen: Soft, nontender, bowel sounds present. Extremities: Chronic appearing leg edema, right worse than left, distal pulses 1-2+. Skin: Warm and dry. Musculoskeletal: No kyphosis. Neuropsychiatric: Alert and oriented x3, affect grossly appropriate.  ECG: I personally reviewed the tracing from 12/02/2016 which showed sinus bradycardia with right bundle branch block, possible old lateral infarct pattern.  Recent Labwork:  April 2018: BUN 38, creatinine 1.51, potassium 4.26 August 2016: Hemoglobin 12.28 January 2016: AST 21, ALT 12, cholesterol 195, triglycerides 238, HDL 45, LDL 101  Other Studies Reviewed Today:  Echocardiogram 05/25/2015: Study Conclusions  - Procedure narrative: Transthoracic echocardiography for left ventricular function evaluation, for right ventricular function evaluation, and for assessment of valvular function. Image quality was adequate. The study was technically difficult, as a result of poor acoustic windows and body habitus. - Left ventricle: The cavity size was normal. There was moderate concentric hypertrophy. Systolic function was normal. The estimated ejection fraction was in the range of 60% to 65%. Images were inadequate for LV wall motion assessment. Features are consistent with a pseudonormal left ventricular filling pattern, with concomitant abnormal relaxation and increased filling pressure (grade 2 diastolic dysfunction). Doppler parameters are consistent with high ventricular filling pressure. - Aortic valve: Bioprosthetic aortic valve as  per report, which appears to be functioning normally. There was trivial regurgitation. Peak velocity (S): 264 cm/s. Mean gradient (S): 14 mm Hg. Valve area (VTI): 2.3 cm^2. Valve area (Vmax): 2.09 cm^2. Valve area (Vmean): 2.46 cm^2. - Mitral valve: Calcified annulus. Mildly calcified leaflets . - Left atrium: The atrium was moderately to severely dilated. - Right ventricle: Systolic function was mildly reduced. - Tricuspid valve: Incomplete tricuspid regurgitant jet. Thus, accurate assessment of pulmonary pressures could not be made.  Assessment and Plan:  1.  Multivessel CAD status post CABG in 2009.  She reports no angina but does have dyspnea on exertion.  Medical therapy is stable including aspirin, statin, losartan, and Toprol-XL.  We will obtain a follow-up Lexiscan Myoview to assess ischemic burden.  2.  Aortic stenosis status post bioprosthetic AVR in 2009.  Valve function was normal by echocardiogram in December 2018.  3.  Diastolic dysfunction with mild right ventricular dysfunction and chronic leg  edema.  She continues on Lasix.  4.  Essential hypertension her blood pressure is very well controlled today.  Current medicines were reviewed with the patient today.   Orders Placed This Encounter  Procedures  . NM Myocar Multi W/Spect W/Wall Motion / EF    Disposition: Follow-up in 1 year, sooner if dictated by stress testing.  Signed, [name], MD, [facility] 12/15/2017 1:24 PM    [facility] at [facility] [address] [place], [place], [place] [address] Phone: [phone]; Fax: [phone]

## 2017-12-15 ENCOUNTER — Ambulatory Visit: Payer: Medicare Other | Admitting: Cardiology

## 2017-12-15 ENCOUNTER — Encounter: Payer: Self-pay | Admitting: *Deleted

## 2017-12-15 ENCOUNTER — Telehealth: Payer: Self-pay | Admitting: Cardiology

## 2017-12-15 ENCOUNTER — Encounter: Payer: Self-pay | Admitting: Cardiology

## 2017-12-15 VITALS — BP 106/66 | HR 76 | Ht 63.0 in | Wt 248.4 lb

## 2017-12-15 DIAGNOSIS — I5032 Chronic diastolic (congestive) heart failure: Secondary | ICD-10-CM

## 2017-12-15 DIAGNOSIS — R0609 Other forms of dyspnea: Secondary | ICD-10-CM

## 2017-12-15 DIAGNOSIS — Z953 Presence of xenogenic heart valve: Secondary | ICD-10-CM | POA: Diagnosis not present

## 2017-12-15 DIAGNOSIS — I10 Essential (primary) hypertension: Secondary | ICD-10-CM | POA: Diagnosis not present

## 2017-12-15 DIAGNOSIS — I25119 Atherosclerotic heart disease of native coronary artery with unspecified angina pectoris: Secondary | ICD-10-CM

## 2017-12-15 NOTE — Telephone Encounter (Signed)
Percert for lexiscan scheduled for 12/23/17 at [facility]

## 2017-12-15 NOTE — Patient Instructions (Addendum)
Medication Instructions:   Your physician recommends that you continue on your current medications as directed. Please refer to the Current Medication list given to you today.  Labwork:  NONE  Testing/Procedures: Your physician has requested that you have a lexiscan myoview. For further information please visit [email or web address]. Please follow instruction sheet, as given.  Follow-Up:  Your physician recommends that you schedule a follow-up appointment in: 1 year. You will receive a reminder letter in the mail in about 10 months reminding you to call and schedule your appointment. If you don't receive this letter, please contact our office.  Any Other Special Instructions Will Be Listed Below (If Applicable).  If you need a refill on your cardiac medications before your next appointment, please call your pharmacy. 

## 2017-12-23 ENCOUNTER — Encounter (HOSPITAL_COMMUNITY): Payer: Self-pay

## 2017-12-23 ENCOUNTER — Encounter (HOSPITAL_COMMUNITY)
Admission: RE | Admit: 2017-12-23 | Discharge: 2017-12-23 | Disposition: A | Discharge status: Home / Self Care | Payer: Medicare Other | Source: Ambulatory Visit | Attending: Cardiology | Admitting: Cardiology

## 2017-12-23 ENCOUNTER — Encounter (HOSPITAL_BASED_OUTPATIENT_CLINIC_OR_DEPARTMENT_OTHER)
Admission: RE | Admit: 2017-12-23 | Discharge: 2017-12-23 | Disposition: A | Discharge status: Home / Self Care | Payer: Medicare Other | Source: Ambulatory Visit | Attending: Cardiology | Admitting: Cardiology

## 2017-12-23 DIAGNOSIS — R0609 Other forms of dyspnea: Secondary | ICD-10-CM | POA: Diagnosis present

## 2017-12-23 DIAGNOSIS — I25119 Atherosclerotic heart disease of native coronary artery with unspecified angina pectoris: Secondary | ICD-10-CM

## 2017-12-23 LAB — NM MYOCAR MULTI W/SPECT W/WALL MOTION / EF
CHL CUP NUCLEAR SSS: 0
LHR: 0.49
LV dias vol: 63 mL (ref 46–106)
LV sys vol: 33 mL
Peak HR: 111 {beats}/min
Rest HR: 73 {beats}/min
SDS: 0
SRS: 0
TID: 0.97

## 2017-12-23 MED ORDER — TECHNETIUM TC 99M TETROFOSMIN IV KIT
30.0000 | PACK | Freq: Once | INTRAVENOUS | Status: AC | PRN
  Administered 2017-12-23: 26.8 via INTRAVENOUS

## 2017-12-23 MED ORDER — SODIUM CHLORIDE 0.9% FLUSH
INTRAVENOUS | Status: AC
  Filled 2017-12-23: qty 160

## 2017-12-23 MED ORDER — TECHNETIUM TC 99M TETROFOSMIN IV KIT
10.0000 | PACK | Freq: Once | INTRAVENOUS | Status: AC | PRN
  Administered 2017-12-23: 8.47 via INTRAVENOUS

## 2017-12-23 MED ORDER — REGADENOSON 0.4 MG/5ML IV SOLN
INTRAVENOUS | Status: AC
  Administered 2017-12-23: 0.4 mg via INTRAVENOUS
  Filled 2017-12-23: qty 5

## 2017-12-23 MED ORDER — SODIUM CHLORIDE 0.9% FLUSH
INTRAVENOUS | Status: AC
  Administered 2017-12-23: 10 mL via INTRAVENOUS
  Filled 2017-12-23: qty 10

## 2017-12-24 ENCOUNTER — Telehealth: Payer: Self-pay | Admitting: *Deleted

## 2017-12-24 NOTE — Telephone Encounter (Signed)
Patient informed and copy sent to PCP. 

## 2017-12-24 NOTE — Telephone Encounter (Signed)
-----   Message from [name], MD sent at 12/23/2017 12:51 PM EDT ----- Results reviewed.  Stress testing was overall low risk and did not significant ischemic burden.  Would continue with medical therapy and follow-up plan unless her symptoms worsen. A copy of this test should be forwarded to Ignatius Specking, MD.

## 2018-01-08 ENCOUNTER — Telehealth: Payer: Self-pay | Admitting: Cardiology

## 2018-01-08 NOTE — Telephone Encounter (Signed)
LMTCB

## 2018-01-08 NOTE — Telephone Encounter (Signed)
Morrie Sheldon RN with [facility] - a medication was added on 12/23/2017 Fenofibrate 145 mg . [employer] wants to know if this was added by Dr. Diona Browner and was lab worked order. Please call [phone] ext [phone].

## 2018-01-09 NOTE — Telephone Encounter (Signed)
Notified Ashley via detailed vm - does not look like Dr. Diona Browner prescribed Fenofibrate.  Last seen 12/15/2017 and no medication changes made.

## 2019-09-01 DIAGNOSIS — M159 Polyosteoarthritis, unspecified: Secondary | ICD-10-CM | POA: Diagnosis not present

## 2019-09-01 DIAGNOSIS — E7800 Pure hypercholesterolemia, unspecified: Secondary | ICD-10-CM | POA: Diagnosis not present

## 2019-09-01 DIAGNOSIS — I509 Heart failure, unspecified: Secondary | ICD-10-CM | POA: Diagnosis not present

## 2019-09-01 DIAGNOSIS — I10 Essential (primary) hypertension: Secondary | ICD-10-CM | POA: Diagnosis not present

## 2019-10-18 DIAGNOSIS — M159 Polyosteoarthritis, unspecified: Secondary | ICD-10-CM | POA: Diagnosis not present

## 2019-10-18 DIAGNOSIS — I10 Essential (primary) hypertension: Secondary | ICD-10-CM | POA: Diagnosis not present

## 2019-10-18 DIAGNOSIS — E7800 Pure hypercholesterolemia, unspecified: Secondary | ICD-10-CM | POA: Diagnosis not present

## 2019-10-18 DIAGNOSIS — I509 Heart failure, unspecified: Secondary | ICD-10-CM | POA: Diagnosis not present

## 2019-11-21 DIAGNOSIS — E7800 Pure hypercholesterolemia, unspecified: Secondary | ICD-10-CM | POA: Diagnosis not present

## 2019-11-21 DIAGNOSIS — M159 Polyosteoarthritis, unspecified: Secondary | ICD-10-CM | POA: Diagnosis not present

## 2019-11-21 DIAGNOSIS — I10 Essential (primary) hypertension: Secondary | ICD-10-CM | POA: Diagnosis not present

## 2019-11-21 DIAGNOSIS — I509 Heart failure, unspecified: Secondary | ICD-10-CM | POA: Diagnosis not present

## 2019-12-02 DIAGNOSIS — E039 Hypothyroidism, unspecified: Secondary | ICD-10-CM | POA: Diagnosis not present

## 2019-12-02 DIAGNOSIS — Z1339 Encounter for screening examination for other mental health and behavioral disorders: Secondary | ICD-10-CM | POA: Diagnosis not present

## 2019-12-02 DIAGNOSIS — E7800 Pure hypercholesterolemia, unspecified: Secondary | ICD-10-CM | POA: Diagnosis not present

## 2019-12-02 DIAGNOSIS — Z79899 Other long term (current) drug therapy: Secondary | ICD-10-CM | POA: Diagnosis not present

## 2019-12-02 DIAGNOSIS — Z0000 Encounter for general adult medical examination without abnormal findings: Secondary | ICD-10-CM | POA: Diagnosis not present

## 2019-12-02 DIAGNOSIS — Z7189 Other specified counseling: Secondary | ICD-10-CM | POA: Diagnosis not present

## 2019-12-02 DIAGNOSIS — Z6841 Body Mass Index (BMI) 40.0 and over, adult: Secondary | ICD-10-CM | POA: Diagnosis not present

## 2019-12-02 DIAGNOSIS — I10 Essential (primary) hypertension: Secondary | ICD-10-CM | POA: Diagnosis not present

## 2019-12-02 DIAGNOSIS — Z1331 Encounter for screening for depression: Secondary | ICD-10-CM | POA: Diagnosis not present

## 2019-12-02 DIAGNOSIS — I5032 Chronic diastolic (congestive) heart failure: Secondary | ICD-10-CM | POA: Diagnosis not present

## 2019-12-02 DIAGNOSIS — R5383 Other fatigue: Secondary | ICD-10-CM | POA: Diagnosis not present

## 2019-12-02 DIAGNOSIS — Z299 Encounter for prophylactic measures, unspecified: Secondary | ICD-10-CM | POA: Diagnosis not present

## 2019-12-06 ENCOUNTER — Encounter: Payer: Self-pay | Admitting: Family Medicine

## 2019-12-06 DIAGNOSIS — I25119 Atherosclerotic heart disease of native coronary artery with unspecified angina pectoris: Secondary | ICD-10-CM | POA: Diagnosis not present

## 2019-12-06 DIAGNOSIS — I5032 Chronic diastolic (congestive) heart failure: Secondary | ICD-10-CM | POA: Diagnosis not present

## 2019-12-06 DIAGNOSIS — R7309 Other abnormal glucose: Secondary | ICD-10-CM | POA: Diagnosis not present

## 2019-12-06 DIAGNOSIS — I10 Essential (primary) hypertension: Secondary | ICD-10-CM | POA: Diagnosis not present

## 2019-12-06 DIAGNOSIS — Z6841 Body Mass Index (BMI) 40.0 and over, adult: Secondary | ICD-10-CM | POA: Diagnosis not present

## 2019-12-06 DIAGNOSIS — Z299 Encounter for prophylactic measures, unspecified: Secondary | ICD-10-CM | POA: Diagnosis not present

## 2019-12-22 DIAGNOSIS — E7800 Pure hypercholesterolemia, unspecified: Secondary | ICD-10-CM | POA: Diagnosis not present

## 2019-12-22 DIAGNOSIS — I509 Heart failure, unspecified: Secondary | ICD-10-CM | POA: Diagnosis not present

## 2019-12-22 DIAGNOSIS — M159 Polyosteoarthritis, unspecified: Secondary | ICD-10-CM | POA: Diagnosis not present

## 2019-12-22 DIAGNOSIS — I10 Essential (primary) hypertension: Secondary | ICD-10-CM | POA: Diagnosis not present

## 2020-01-21 DIAGNOSIS — I509 Heart failure, unspecified: Secondary | ICD-10-CM | POA: Diagnosis not present

## 2020-01-21 DIAGNOSIS — M159 Polyosteoarthritis, unspecified: Secondary | ICD-10-CM | POA: Diagnosis not present

## 2020-01-21 DIAGNOSIS — E7800 Pure hypercholesterolemia, unspecified: Secondary | ICD-10-CM | POA: Diagnosis not present

## 2020-01-21 DIAGNOSIS — I10 Essential (primary) hypertension: Secondary | ICD-10-CM | POA: Diagnosis not present

## 2020-02-03 DIAGNOSIS — I509 Heart failure, unspecified: Secondary | ICD-10-CM | POA: Diagnosis not present

## 2020-02-03 DIAGNOSIS — I10 Essential (primary) hypertension: Secondary | ICD-10-CM | POA: Diagnosis not present

## 2020-02-03 DIAGNOSIS — E7800 Pure hypercholesterolemia, unspecified: Secondary | ICD-10-CM | POA: Diagnosis not present

## 2020-02-03 DIAGNOSIS — M159 Polyosteoarthritis, unspecified: Secondary | ICD-10-CM | POA: Diagnosis not present

## 2020-02-22 DIAGNOSIS — L218 Other seborrheic dermatitis: Secondary | ICD-10-CM | POA: Diagnosis not present

## 2020-03-23 DIAGNOSIS — I10 Essential (primary) hypertension: Secondary | ICD-10-CM | POA: Diagnosis not present

## 2020-03-23 DIAGNOSIS — I509 Heart failure, unspecified: Secondary | ICD-10-CM | POA: Diagnosis not present

## 2020-03-23 DIAGNOSIS — M159 Polyosteoarthritis, unspecified: Secondary | ICD-10-CM | POA: Diagnosis not present

## 2020-03-23 DIAGNOSIS — E7800 Pure hypercholesterolemia, unspecified: Secondary | ICD-10-CM | POA: Diagnosis not present

## 2020-03-23 DIAGNOSIS — L218 Other seborrheic dermatitis: Secondary | ICD-10-CM | POA: Diagnosis not present

## 2020-04-21 DIAGNOSIS — I509 Heart failure, unspecified: Secondary | ICD-10-CM | POA: Diagnosis not present

## 2020-04-21 DIAGNOSIS — M159 Polyosteoarthritis, unspecified: Secondary | ICD-10-CM | POA: Diagnosis not present

## 2020-04-21 DIAGNOSIS — I10 Essential (primary) hypertension: Secondary | ICD-10-CM | POA: Diagnosis not present

## 2020-04-21 DIAGNOSIS — E7800 Pure hypercholesterolemia, unspecified: Secondary | ICD-10-CM | POA: Diagnosis not present

## 2020-05-02 DIAGNOSIS — Z299 Encounter for prophylactic measures, unspecified: Secondary | ICD-10-CM | POA: Diagnosis not present

## 2020-05-02 DIAGNOSIS — L309 Dermatitis, unspecified: Secondary | ICD-10-CM | POA: Diagnosis not present

## 2020-05-02 DIAGNOSIS — M25552 Pain in left hip: Secondary | ICD-10-CM | POA: Diagnosis not present

## 2020-05-02 DIAGNOSIS — I5032 Chronic diastolic (congestive) heart failure: Secondary | ICD-10-CM | POA: Diagnosis not present

## 2020-05-12 DIAGNOSIS — I10 Essential (primary) hypertension: Secondary | ICD-10-CM | POA: Diagnosis not present

## 2020-05-12 DIAGNOSIS — Z299 Encounter for prophylactic measures, unspecified: Secondary | ICD-10-CM | POA: Diagnosis not present

## 2020-05-12 DIAGNOSIS — Z6841 Body Mass Index (BMI) 40.0 and over, adult: Secondary | ICD-10-CM | POA: Diagnosis not present

## 2020-05-12 DIAGNOSIS — M25552 Pain in left hip: Secondary | ICD-10-CM | POA: Diagnosis not present

## 2020-05-12 DIAGNOSIS — N1832 Chronic kidney disease, stage 3b: Secondary | ICD-10-CM | POA: Diagnosis not present

## 2020-05-23 DIAGNOSIS — L218 Other seborrheic dermatitis: Secondary | ICD-10-CM | POA: Diagnosis not present

## 2020-05-23 DIAGNOSIS — M159 Polyosteoarthritis, unspecified: Secondary | ICD-10-CM | POA: Diagnosis not present

## 2020-05-23 DIAGNOSIS — I509 Heart failure, unspecified: Secondary | ICD-10-CM | POA: Diagnosis not present

## 2020-05-23 DIAGNOSIS — E7800 Pure hypercholesterolemia, unspecified: Secondary | ICD-10-CM | POA: Diagnosis not present

## 2020-05-23 DIAGNOSIS — I10 Essential (primary) hypertension: Secondary | ICD-10-CM | POA: Diagnosis not present

## 2020-06-22 DIAGNOSIS — I509 Heart failure, unspecified: Secondary | ICD-10-CM | POA: Diagnosis not present

## 2020-06-22 DIAGNOSIS — E7800 Pure hypercholesterolemia, unspecified: Secondary | ICD-10-CM | POA: Diagnosis not present

## 2020-06-22 DIAGNOSIS — I10 Essential (primary) hypertension: Secondary | ICD-10-CM | POA: Diagnosis not present

## 2020-06-22 DIAGNOSIS — M159 Polyosteoarthritis, unspecified: Secondary | ICD-10-CM | POA: Diagnosis not present

## 2020-06-26 ENCOUNTER — Other Ambulatory Visit: Payer: Self-pay

## 2020-06-26 ENCOUNTER — Encounter: Payer: Self-pay | Admitting: Cardiology

## 2020-06-26 ENCOUNTER — Ambulatory Visit: Payer: Medicare PPO | Admitting: Cardiology

## 2020-06-26 VITALS — BP 114/72 | HR 91 | Resp 18 | Ht 63.0 in | Wt 238.6 lb

## 2020-06-26 DIAGNOSIS — I4819 Other persistent atrial fibrillation: Secondary | ICD-10-CM

## 2020-06-26 DIAGNOSIS — I25119 Atherosclerotic heart disease of native coronary artery with unspecified angina pectoris: Secondary | ICD-10-CM | POA: Diagnosis not present

## 2020-06-26 DIAGNOSIS — Z953 Presence of xenogenic heart valve: Secondary | ICD-10-CM

## 2020-06-26 MED ORDER — LOSARTAN POTASSIUM 25 MG PO TABS: 25.0000 mg | ORAL_TABLET | Freq: Every day | ORAL | 3 refills | Status: DC

## 2020-06-26 MED ORDER — METOPROLOL SUCCINATE ER 50 MG PO TB24: 50.0000 mg | ORAL_TABLET | Freq: Two times a day (BID) | ORAL | 3 refills | Status: DC

## 2020-06-26 NOTE — Patient Instructions (Signed)
Medication Instructions:  DECREASE Losartan to 25 mg daily   INCREASE Toprol to 50 mg twice a day    *If you need a refill on your cardiac medications before your next appointment, please call your pharmacy*   Lab Work: CBC and BMET   If you have labs (blood work) drawn today and your tests are completely normal, you will receive your results only by: Marland Kitchen MyChart Message (if you have MyChart) OR . A paper copy in the mail If you have any lab test that is abnormal or we need to change your treatment, we will call you to review the results.   Testing/Procedures: Your physician has requested that you have an echocardiogram. Echocardiography is a painless test that uses sound waves to create images of your heart. It provides your doctor with information about the size and shape of your heart and how well your heart's chambers and valves are working. This procedure takes approximately one hour. There are no restrictions for this procedure.     Follow-Up: At [facility], you and your health needs are our priority.  As part of our continuing mission to provide you with exceptional heart care, we have created designated Provider Care Teams.  These Care Teams include your primary Cardiologist (physician) and Advanced Practice Providers (APPs -  Physician Assistants and Nurse Practitioners) who all work together to provide you with the care you need, when you need it.  We recommend signing up for the patient portal called "MyChart".  Sign up information is provided on this After Visit Summary.  MyChart is used to connect with patients for Virtual Visits (Telemedicine).  Patients are able to view lab/test results, encounter notes, upcoming appointments, etc.  Non-urgent messages can be sent to your provider as well.   To learn more about what you can do with MyChart, go to [email or web address].    Your next appointment:   3 week(s)  The format for your next appointment:   In  Person  Provider:   You may see [name], MD or the following Advanced Practice Provider on your designated Care Team:    Nena Polio, NP    Other Instructions None       Thank you for choosing [facility] !

## 2020-06-26 NOTE — Progress Notes (Signed)
Cardiology Office Note  Date: 06/26/2020   ID: DURENE DODGE, DOB 1940/12/20, MRN [id number]  PCP:  Ignatius Specking, MD  Cardiologist:  [name], MD Electrophysiologist:  None   Chief Complaint  Patient presents with  . Cardiac follow-up    History of Present Illness: [name] is a 80 y.o. female not seen since 2019.  She presents overdue for follow-up.  She tells me that over the last 6 weeks she has been more short of breath with activity, also experiencing fatigue.  No definite angina symptoms.  She has not felt any sense of palpitations.  I personally reviewed her ECG today which shows atrial fibrillation at 95 bpm with right bundle branch block.  With getting up onto the examination table, heart rate goes into the 120s.  CHA2DS2-VASc score is 5.  We discussed stroke risk and indication for anticoagulation.  She had been transiently on Coumadin with postoperative atrial fibrillation many years ago.  Last echocardiogram was in 2016 as noted below.  She did undergo follow-up ischemic testing in 2019 that was overall low risk.  I reviewed her medications.  Past Medical History:  Diagnosis Date  . Aortic stenosis   . Arthritis   . Atrial fibrillation (HCC)    Postoperative, transiently on Coumadin  . Coronary atherosclerosis of native coronary artery    Multivessel  . Essential hypertension   . Gout   . Hypothyroidism   . Mixed hyperlipidemia   . Psoriasis     Past Surgical History:  Procedure Laterality Date  . AORTIC VALVE REPLACEMENT  2009   21 mm [facility] pericardial valve  . CHOLECYSTECTOMY     2011  . CORONARY ARTERY BYPASS GRAFT  2009   LIMA to LAD, SVG to RCA  . JOINT REPLACEMENT  2012   L TOTAL KNEE  . TOTAL KNEE ARTHROPLASTY  09/16/2011   Procedure: TOTAL KNEE ARTHROPLASTY;  Surgeon: Loanne Drilling, MD;  Location: [facility];  Service: Orthopedics;  Laterality: Right;  . TOTAL THYROIDECTOMY  1970    Current Outpatient Medications   Medication Sig Dispense Refill  . allopurinol (ZYLOPRIM) 300 MG tablet Take 300 mg by mouth daily.  3  . aspirin EC 81 MG tablet Take 81 mg by mouth daily.    Marland Kitchen atorvastatin (LIPITOR) 10 MG tablet Take 10 mg by mouth daily.    Marland Kitchen docusate sodium (COLACE) 100 MG capsule Take 100 mg by mouth daily.    . ferrous sulfate 325 (65 FE) MG EC tablet Take 325 mg by mouth daily.    . furosemide (LASIX) 40 MG tablet Take 1.5 tablets (60 mg total) by mouth 2 (two) times daily. 60 tablet 3  . levothyroxine (SYNTHROID, LEVOTHROID) 125 MCG tablet Take 125 mcg by mouth daily before breakfast.    . LINZESS 145 MCG CAPS capsule Take 145 mcg by mouth daily.  5  . losartan (COZAAR) 25 MG tablet Take 1 tablet (25 mg total) by mouth daily. 90 tablet 3  . metoprolol succinate (TOPROL-XL) 50 MG 24 hr tablet Take 1 tablet (50 mg total) by mouth in the morning and at bedtime. Take with or immediately following a meal. 180 tablet 3  . Multiple Vitamins-Minerals (CENTRUM SILVER PO) Take by mouth daily.    . pantoprazole (PROTONIX) 40 MG tablet Take 40 mg by mouth daily.  4  . potassium chloride SA (K-DUR,KLOR-CON) 20 MEQ tablet Take 20 mEq by mouth daily.    . Vitamin  D, Ergocalciferol, (DRISDOL) 50000 UNITS CAPS Take 50,000 Units by mouth every 7 (seven) days.      No current facility-administered medications for this visit.   Allergies:  Penicillins and Statins   Social History: The patient  reports that she quit smoking about 31 years ago. She has never used smokeless tobacco. She reports that she does not drink alcohol and does not use drugs.   Family History: The patient's family history includes Aneurysm in her father; Multiple sclerosis in her sister.   ROS: No orthopnea or PND.  Physical Exam: VS:  BP 114/72   Pulse 91   Resp 18   Ht 5\' 3"  (1.6 m)   Wt 238 lb 9.6 oz (108.2 kg)   LMP  (LMP Unknown)   SpO2 98%   BMI 42.27 kg/m , BMI Body mass index is 42.27 kg/m.  Wt Readings from Last 3 Encounters:   06/26/20 238 lb 9.6 oz (108.2 kg)  12/15/17 248 lb 6.4 oz (112.7 kg)  12/02/16 262 lb 9.6 oz (119.1 kg)    General: Patient appears comfortable at rest. HEENT: Conjunctiva and lids normal, wearing a mask. Neck: Supple, no elevated JVP or carotid bruits, no thyromegaly. Lungs: Clear to auscultation, nonlabored breathing at rest. Cardiac: Irregularly irregular, no S3, 2/6 systolic murmur, no pericardial rub. Abdomen: Soft, nontender, bowel sounds present. Extremities: No pitting edema, distal pulses 2+. Skin: Warm and dry. Musculoskeletal: No kyphosis. Neuropsychiatric: Alert and oriented x3, affect grossly appropriate.  ECG:  An ECG dated 12/02/2016 was personally reviewed today and demonstrated:  Sinus bradycardia with right bundle branch block, possible old lateral infarct pattern.  Recent Labwork:  No recent lab work for review today.  Other Studies Reviewed Today:  Echocardiogram 05/25/2015: - Procedure narrative: Transthoracic echocardiography for left  ventricular function evaluation, for right ventricular function  evaluation, and for assessment of valvular function. Image  quality was adequate. The study was technically difficult, as a  result of poor acoustic windows and body habitus.  - Left ventricle: The cavity size was normal. There was moderate  concentric hypertrophy. Systolic function was normal. The  estimated ejection fraction was in the range of 60% to 65%.  Images were inadequate for LV wall motion assessment. Features  are consistent with a pseudonormal left ventricular filling  pattern, with concomitant abnormal relaxation and increased  filling pressure (grade 2 diastolic dysfunction). Doppler  parameters are consistent with high ventricular filling pressure.  - Aortic valve: Bioprosthetic aortic valve as per report, which  appears to be functioning normally. There was trivial  regurgitation. Peak velocity (S): 264 cm/s. Mean  gradient (S): 14  mm Hg. Valve area (VTI): 2.3 cm^2. Valve area (Vmax): 2.09 cm^2.  Valve area (Vmean): 2.46 cm^2.  - Mitral valve: Calcified annulus. Mildly calcified leaflets .  - Left atrium: The atrium was moderately to severely dilated.  - Right ventricle: Systolic function was mildly reduced.  - Tricuspid valve: Incomplete tricuspid regurgitant jet. Thus,  accurate assessment of pulmonary pressures could not be made.   Lexiscan Myoview 12/23/2017:  There was no ST segment deviation noted during stress. Sinus rhythm with right bundle branch block and frequent PACs seen throughout study.  Defect 1: There is a defect present in the mid anterior and apical anterior location. Images appear to be improved with stress consistent with reversed redistribution. There are no significant ischemic territories.  This is a low risk study.  Nuclear stress EF: 48%.  Assessment and Plan:  1.  Dyspnea  on exertion and fatigue, potentially related to atrial fibrillation with onset in the last 6 weeks based on review of symptoms.  CHA2DS2-VASc score is 5.  Plan at this time is to increase Toprol-XL to 50 mg twice daily, concurrently reduce Cozaar to 25 mg daily, and check CBC and BMET with plan to switch from aspirin to DOAC most likely.  We will get a follow-up echocardiogram to reevaluate LVEF.  Office follow-up arranged for further discussion.  I did talk with her about possibility of cardioversion depending on how she does.  2.  Multivessel CAD status post CABG.  Ischemic testing in 2019 was low risk.  May need to consider follow-up ischemic evaluation depending on how she does symptomatically with management of atrial fibrillation.  3.  History of aortic stenosis status post bioprosthetic AVR, follow-up echocardiogram will be obtained in comparison to previous study from 2016.  Medication Adjustments/Labs and Tests Ordered: Current medicines are reviewed at length with the patient today.   Concerns regarding medicines are outlined above.   Tests Ordered: Orders Placed This Encounter  Procedures  . CBC  . Basic metabolic panel  . EKG 12-Lead  . ECHOCARDIOGRAM COMPLETE    Medication Changes: Meds ordered this encounter  Medications  . losartan (COZAAR) 25 MG tablet    Sig: Take 1 tablet (25 mg total) by mouth daily.    Dispense:  90 tablet    Refill:  3    06/26/20 decreased to 25 mg qd  . metoprolol succinate (TOPROL-XL) 50 MG 24 hr tablet    Sig: Take 1 tablet (50 mg total) by mouth in the morning and at bedtime. Take with or immediately following a meal.    Dispense:  180 tablet    Refill:  3    06/26/20 increased to 50 mg BID    Disposition:  Follow up 2 to 3 weeks.  Signed, Satira Sark, MD, [facility] 06/26/2020 3:45 PM    [facility] at [place], [place],  [address] Phone: [phone]; Fax: [phone]

## 2020-06-28 ENCOUNTER — Ambulatory Visit (INDEPENDENT_AMBULATORY_CARE_PROVIDER_SITE_OTHER): Payer: Medicare PPO

## 2020-06-28 ENCOUNTER — Other Ambulatory Visit: Payer: Self-pay | Admitting: Cardiology

## 2020-06-28 DIAGNOSIS — I359 Nonrheumatic aortic valve disorder, unspecified: Secondary | ICD-10-CM | POA: Diagnosis not present

## 2020-06-28 LAB — ECHOCARDIOGRAM COMPLETE
AR max vel: 1.94 cm2
AV Area VTI: 2.09 cm2
AV Area mean vel: 2.04 cm2
AV Mean grad: 10 mmHg
AV Peak grad: 20.9 mmHg
Ao pk vel: 2.29 m/s
Area-P 1/2: 4.29 cm2
Calc EF: 55.9 %
MV M vel: 1.91 m/s
MV Peak grad: 14.6 mmHg
S' Lateral: 2.61 cm
Single Plane A2C EF: 42.8 %
Single Plane A4C EF: 67.5 %

## 2020-06-30 ENCOUNTER — Telehealth: Payer: Self-pay | Admitting: *Deleted

## 2020-06-30 NOTE — Telephone Encounter (Signed)
Patient informed. Copy sent to PCP °

## 2020-06-30 NOTE — Telephone Encounter (Signed)
-----   Message from [name], MD sent at 06/28/2020  5:25 PM EST ----- Results reviewed.  Normal LVEF.  Aortic prosthesis with normal function, mean gradient 10 mmHg.  No aortic regurgitation.  Let her know that the heart valve looks good and we will continue with our current treatment plan for atrial fibrillation.

## 2020-07-16 NOTE — Progress Notes (Signed)
Cardiology Office Note  Date: 07/17/2020   ID: BEE MARCHIANO, DOB [birth year], MRN [id number]  PCP:  Ignatius Specking, MD  Cardiologist:  [name], MD Electrophysiologist:  None   Chief Complaint: Cardiac follow-up  History of Present Illness: [name] is a 80 y.o. female with a history of atrial fibrillation, CAD, HTN, hypothyroidism, HLD, gout, psoriasis, arthritis.  Last encounter with Dr. Diona Browner 06/26/2020.  She described feeling over the prior 6 weeks had been more short of breath with activity with fatigue.  No definite anginal symptoms.  No palpitations.  EKG showed atrial fibrillation with a rate of 95 with right bundle branch block.  Heart rate increased with more activity.  Chest score was 5.  Stroke risk and indication for anticoagulation was discussed.  She had previously been on Coumadin for postop atrial fibrillation years ago.  Previous low risk stress test 2019.  Toprol XL was increased to 50 mg twice daily with Cozaar being reduced to 25 mg daily.  CBC and basic metabolic panel were ordered.  Plan was to switch from aspirin to DOAC most likely.  Plan to follow-up with echo to reevaluate LVEF.  Possibility of cardioversion was discussed depending on how she did on these medications.  He mentioned she may need to consider follow-up ischemic evaluation depending on how she did symptomatically with management of atrial fibrillation.  History of aortic stenosis status post bioprosthetic AVR with plans for follow-up echo.  She is here for follow-up today after Toprol dose was increased to 50 mg p.o. twice daily.  States she is feeling much better with significant decrease in palpitations since the increase of Toprol dosage.  Blood pressures well controlled at 114/64 after decreasing Cozaar to 25 mg.  She states she recently had the requested lab work at last visit drawn but we do not have those results.  We will request them from [facility].  We discussed the results of the  echocardiogram which showed EF is 60 to 65%.  No evidence of grade 2 diastolic function as compared to previous echo in 2016.  No evidence of moderate LVH as seen on previous echo in 2016.  21 mm Edwards magna pericardial valve is an AV position.  No aortic valve regurgitation was visualized.  No aortic stenosis was present.  Patient states she has not been very active and does not tolerate activity very well.  Denies any significant DOE or shortness of breath.   Past Medical History:  Diagnosis Date  . Aortic stenosis   . Arthritis   . Atrial fibrillation (HCC)    Postoperative, transiently on Coumadin  . Coronary atherosclerosis of native coronary artery    Multivessel  . Essential hypertension   . Gout   . Hypothyroidism   . Mixed hyperlipidemia   . Psoriasis     Past Surgical History:  Procedure Laterality Date  . AORTIC VALVE REPLACEMENT  2009   21 mm [facility] pericardial valve  . CHOLECYSTECTOMY     2011  . CORONARY ARTERY BYPASS GRAFT  2009   LIMA to LAD, SVG to RCA  . JOINT REPLACEMENT  2012   L TOTAL KNEE  . TOTAL KNEE ARTHROPLASTY  09/16/2011   Procedure: TOTAL KNEE ARTHROPLASTY;  Surgeon: Loanne Drilling, MD;  Location: [facility];  Service: Orthopedics;  Laterality: Right;  . TOTAL THYROIDECTOMY  1970    Current Outpatient Medications  Medication Sig Dispense Refill  . allopurinol (ZYLOPRIM) 300 MG tablet Take 300  mg by mouth daily.  3  . apixaban (ELIQUIS) 5 MG TABS tablet Take 1 tablet (5 mg total) by mouth 2 (two) times daily. 60 tablet 0  . atorvastatin (LIPITOR) 10 MG tablet Take 10 mg by mouth daily.    Marland Kitchen docusate sodium (COLACE) 100 MG capsule Take 100 mg by mouth daily.    . ferrous sulfate 325 (65 FE) MG EC tablet Take 325 mg by mouth daily.    . furosemide (LASIX) 40 MG tablet Take 1.5 tablets (60 mg total) by mouth 2 (two) times daily. 60 tablet 3  . levothyroxine (SYNTHROID, LEVOTHROID) 125 MCG tablet Take 125 mcg by mouth daily before breakfast.    .  LINZESS 145 MCG CAPS capsule Take 145 mcg by mouth daily.  5  . losartan (COZAAR) 25 MG tablet Take 1 tablet (25 mg total) by mouth daily. 90 tablet 3  . metoprolol succinate (TOPROL-XL) 50 MG 24 hr tablet Take 1 tablet (50 mg total) by mouth in the morning and at bedtime. Take with or immediately following a meal. 180 tablet 3  . Multiple Vitamins-Minerals (CENTRUM SILVER PO) Take by mouth daily.    . pantoprazole (PROTONIX) 40 MG tablet Take 40 mg by mouth daily.  4  . potassium chloride SA (K-DUR,KLOR-CON) 20 MEQ tablet Take 20 mEq by mouth daily.    . Vitamin D, Ergocalciferol, (DRISDOL) 50000 UNITS CAPS Take 50,000 Units by mouth every 7 (seven) days.      No current facility-administered medications for this visit.   Allergies:  Penicillins and Statins   Social History: The patient  reports that she quit smoking about 31 years ago. She has never used smokeless tobacco. She reports that she does not drink alcohol and does not use drugs.   Family History: The patient's family history includes Aneurysm in her father; Multiple sclerosis in her sister.   ROS:  Please see the history of present illness. Otherwise, complete review of systems is positive for none.  All other systems are reviewed and negative.   Physical Exam: VS:  BP 114/64   Pulse 87   Resp 16   Ht 5\' 3"  (1.6 m)   Wt 236 lb 3.2 oz (107.1 kg)   LMP  (LMP Unknown)   SpO2 97%   BMI 41.84 kg/m , BMI Body mass index is 41.84 kg/m.  Wt Readings from Last 3 Encounters:  07/17/20 236 lb 3.2 oz (107.1 kg)  06/26/20 238 lb 9.6 oz (108.2 kg)  12/15/17 248 lb 6.4 oz (112.7 kg)    General: Morbidly obese patient appears comfortable at rest. Neck: Supple, no elevated JVP or carotid bruits, no thyromegaly. Lungs: Clear to auscultation, nonlabored breathing at rest. Cardiac: Regular rate and rhythm, no S3 or significant systolic murmur, no pericardial rub. Extremities: No pitting edema, distal pulses 2+. Skin: Warm and  dry. Musculoskeletal: No kyphosis. Neuropsychiatric: Alert and oriented x3, affect grossly appropriate.  ECG:  EKG on June 26, 2018 demonstrated atrial fibrillation with rate of 95, RBBB, anterior lateral infarct, age undetermined  Recent Labwork: No results found for requested labs within last 8760 hours.     Component Value Date/Time   CHOL  12/09/2007 0415    158        ATP III CLASSIFICATION:  <200     mg/dL   Desirable  12/11/2007  mg/dL   Borderline High  782-956    mg/dL   High   TRIG >=213 086 0415   HDL 38 (  L) 12/09/2007 0415   CHOLHDL 4.2 12/09/2007 0415   VLDL 28 12/09/2007 0415   LDLCALC  12/09/2007 0415    92        Total Cholesterol/HDL:CHD Risk Coronary Heart Disease Risk Table                     Men   Women  1/2 Average Risk   3.4   3.3    Other Studies Reviewed Today:  Echocardiogram 06/28/2020 1. Left ventricular ejection fraction, by estimation, is 60 to 65%. The left ventricle has normal function. The left ventricle has no regional wall motion abnormalities. Left ventricular diastolic parameters are indeterminate. 2. Right ventricular systolic function is normal. The right ventricular size is normal. 3. Left atrial size was moderately dilated. 4. Right atrial size was moderately dilated. 5. The mitral valve is normal in structure. No evidence of mitral valve regurgitation. No evidence of mitral stenosis. 6. 21 mm Edwards Magna pericardial valve is in the AV position. . The aortic valve has been repaired/replaced. Aortic valve regurgitation is not visualized. No aortic stenosis is present. Comparison(s): Echocardiogram done 05/04/15 showed an EF of 60-65% with an AV Peak Grad of 28 mm Hg.    Echocardiogram 05/25/2015: - Procedure narrative: Transthoracic echocardiography for left  ventricular function evaluation, for right ventricular function  evaluation, and for assessment of valvular function. Image  quality was adequate. The study was  technically difficult, as a  result of poor acoustic windows and body habitus.  - Left ventricle: The cavity size was normal. There was moderate  concentric hypertrophy. Systolic function was normal. The  estimated ejection fraction was in the range of 60% to 65%.  Images were inadequate for LV wall motion assessment. Features  are consistent with a pseudonormal left ventricular filling  pattern, with concomitant abnormal relaxation and increased  filling pressure (grade 2 diastolic dysfunction). Doppler  parameters are consistent with high ventricular filling pressure.  - Aortic valve: Bioprosthetic aortic valve as per report, which  appears to be functioning normally. There was trivial  regurgitation. Peak velocity (S): 264 cm/s. Mean gradient (S): 14  mm Hg. Valve area (VTI): 2.3 cm^2. Valve area (Vmax): 2.09 cm^2.  Valve area (Vmean): 2.46 cm^2.  - Mitral valve: Calcified annulus. Mildly calcified leaflets .  - Left atrium: The atrium was moderately to severely dilated.  - Right ventricle: Systolic function was mildly reduced.  - Tricuspid valve: Incomplete tricuspid regurgitant jet. Thus,  accurate assessment of pulmonary pressures could not be made.   Lexiscan Myoview 12/23/2017:  There was no ST segment deviation noted during stress. Sinus rhythm with right bundle branch block and frequent PACs seen throughout study.  Defect 1: There is a defect present in the mid anterior and apical anterior location. Images appear to be improved with stress consistent with reversed redistribution. There are no significant ischemic territories.  This is a low risk study.  Nuclear stress EF: 48%.   Assessment and Plan:  1. Permanent atrial fibrillation (HCC)   2. Dyspnea on exertion   3. CAD in native artery   4. History of aortic stenosis   5. Mixed hyperlipidemia    1. Permanent atrial fibrillation (HCC) States since increasing the dose of Toprol-XL to 50 mg  p.o. twice daily she has noticed a significant decrease in palpitations/arrhythmias.  Continue Toprol-XL 50 mg p.o. twice daily.  Stop aspirin.  Start Eliquis 5 mg p.o. twice daily.  We are attempting to obtain recent  lab results from lab core.  2. Dyspnea on exertion Dyspnea on exertion has improved some since increasing dosage of Toprol-XL to p.o. twice daily.  We discussed the results of the echocardiogram as noted above.  3. CAD in native artery Denies any anginal symptoms.  Dyspnea has improved since increasing Toprol-XL to twice daily.  We are starting Eliquis for systemic anticoagulation for atrial fibrillation.  We will DC aspirin due to this fact.  4. History of aortic stenosis Recent echocardiogram showed21 mm Edwards magna pericardial valve in a V position.  Aortic valve has been repaired/replaced.  Aortic valve vegetation not visualized.  No aortic stenosis is presen  5.  Hypertension Blood pressure well controlled today.  BP 114/64 continue losartan 25 mg daily, Toprol-XL 50 mg p.o. twice daily.  Continue Lasix 60 mg p.o. twice daily.  6.  Mixed hyperlipidemia Continue atorvastatin 10 mg daily.  Medication Adjustments/Labs and Tests Ordered: Current medicines are reviewed at length with the patient today.  Concerns regarding medicines are outlined above.   Disposition: Follow-up with Dr. Diona Browner APP 6 months  Signed, Rennis Harding, NP 07/17/2020 2:03 PM    [facility] at [facility] [address] [place], [place], [place] [address] Phone: [phone]; Fax: [phone]

## 2020-07-17 ENCOUNTER — Encounter: Payer: Self-pay | Admitting: *Deleted

## 2020-07-17 ENCOUNTER — Other Ambulatory Visit: Payer: Self-pay

## 2020-07-17 ENCOUNTER — Ambulatory Visit (INDEPENDENT_AMBULATORY_CARE_PROVIDER_SITE_OTHER): Payer: Medicare PPO | Admitting: Family Medicine

## 2020-07-17 ENCOUNTER — Encounter: Payer: Self-pay | Admitting: Family Medicine

## 2020-07-17 VITALS — BP 114/64 | HR 87 | Resp 16 | Ht 63.0 in | Wt 236.2 lb

## 2020-07-17 DIAGNOSIS — I2510 Atherosclerotic heart disease of native coronary artery without angina pectoris: Secondary | ICD-10-CM | POA: Diagnosis not present

## 2020-07-17 DIAGNOSIS — Z8679 Personal history of other diseases of the circulatory system: Secondary | ICD-10-CM

## 2020-07-17 DIAGNOSIS — R0600 Dyspnea, unspecified: Secondary | ICD-10-CM

## 2020-07-17 DIAGNOSIS — E782 Mixed hyperlipidemia: Secondary | ICD-10-CM | POA: Diagnosis not present

## 2020-07-17 DIAGNOSIS — R0609 Other forms of dyspnea: Secondary | ICD-10-CM

## 2020-07-17 DIAGNOSIS — I4821 Permanent atrial fibrillation: Secondary | ICD-10-CM

## 2020-07-17 MED ORDER — APIXABAN 5 MG PO TABS: 5.0000 mg | ORAL_TABLET | Freq: Two times a day (BID) | ORAL | 0 refills | Status: DC

## 2020-07-17 MED ORDER — APIXABAN 5 MG PO TABS: 5.0000 mg | ORAL_TABLET | Freq: Two times a day (BID) | ORAL | 6 refills | Status: DC

## 2020-07-17 NOTE — Patient Instructions (Addendum)
Medication Instructions:   Stop Aspirin.  Begin Eliquis 5mg  twice a day.  Continue all other medications.    Labwork: none  Testing/Procedures: none  Follow-Up: 6 months   Any Other Special Instructions Will Be Listed Below (If Applicable). 1 month with anticoagulation nurse for Eliquis management   If you need a refill on your cardiac medications before your next appointment, please call your pharmacy.

## 2020-07-24 ENCOUNTER — Encounter: Payer: Self-pay | Admitting: *Deleted

## 2020-07-28 ENCOUNTER — Telehealth: Payer: Self-pay | Admitting: *Deleted

## 2020-07-28 NOTE — Telephone Encounter (Signed)
Netta Neat., NP  07/28/2020 1:04 PM EST      Hemoglobin and hematocrit look good at 13.7 and 41.1. Creatinine is 1.43 and GFR is 35. There is some improvement in renal function from previous lab work and June 2021   Netta Neat., NP  07/26/2020 1:48 PM EST      CBC looks good. No evidence of infection or anemia. Creatinine is 1.43, GFR is 35. This is slightly better than the creatinine in June which was 1.5 and GFR was 33. Sodium is just slightly high at 147.

## 2020-07-28 NOTE — Telephone Encounter (Signed)
Lesle Chris, LPN  03/01/3381 5:05 PM EST Back to Top     Notified, copy to pcp.

## 2020-08-16 ENCOUNTER — Ambulatory Visit (INDEPENDENT_AMBULATORY_CARE_PROVIDER_SITE_OTHER): Payer: Medicare PPO | Admitting: *Deleted

## 2020-08-16 NOTE — Progress Notes (Signed)
Pt was started on Eliquis for atrial fib on 07/17/20.    Pt denies any side effects since starting Eliquis.  No abnormal bruising, bleeding or GI upset.  Reviewed patients medication list.  Pt is not currently on any combined P-gp and strong CYP3A4 inhibitors/inducers (ketoconazole, traconazole, ritonavir, carbamazepine, phenytoin, rifampin, St. John's wort).  Reviewed labs from 06/26/20.  SCr 1.43, Weight 107.1kg, CrCl 53.93.  Dose is appropriate based on age, weight, and SCr.  Hgb and HCT 13.7/41.1  Plts 262  A full discussion of the nature of anticoagulants has been carried out.  A benefit/risk analysis has been presented to the patient, so that they understand the justification for choosing anticoagulation with Eliquis at this time.  The need for compliance is stressed.  Pt is aware to take the medication twice daily.  Side effects of potential bleeding are discussed, including unusual colored urine or stools, coughing up blood or coffee ground emesis, nose bleeds or serious fall or head trauma.  Discussed signs and symptoms of stroke. The patient should avoid any OTC items containing aspirin or ibuprofen.  Avoid alcohol consumption.   Call if any signs of abnormal bleeding.  Discussed financial obligations and resolved any difficulty in obtaining medication.  Next lab test in 6 months.

## 2020-08-29 DIAGNOSIS — I5032 Chronic diastolic (congestive) heart failure: Secondary | ICD-10-CM | POA: Diagnosis not present

## 2020-08-29 DIAGNOSIS — I25119 Atherosclerotic heart disease of native coronary artery with unspecified angina pectoris: Secondary | ICD-10-CM | POA: Diagnosis not present

## 2020-08-29 DIAGNOSIS — Z299 Encounter for prophylactic measures, unspecified: Secondary | ICD-10-CM | POA: Diagnosis not present

## 2020-08-29 DIAGNOSIS — I10 Essential (primary) hypertension: Secondary | ICD-10-CM | POA: Diagnosis not present

## 2020-10-09 DIAGNOSIS — R0602 Shortness of breath: Secondary | ICD-10-CM | POA: Diagnosis not present

## 2020-10-09 DIAGNOSIS — Z299 Encounter for prophylactic measures, unspecified: Secondary | ICD-10-CM | POA: Diagnosis not present

## 2020-10-09 DIAGNOSIS — D692 Other nonthrombocytopenic purpura: Secondary | ICD-10-CM | POA: Diagnosis not present

## 2020-10-09 DIAGNOSIS — I5032 Chronic diastolic (congestive) heart failure: Secondary | ICD-10-CM | POA: Diagnosis not present

## 2020-10-10 ENCOUNTER — Telehealth: Payer: Self-pay | Admitting: Cardiology

## 2020-10-10 DIAGNOSIS — I5020 Unspecified systolic (congestive) heart failure: Secondary | ICD-10-CM | POA: Diagnosis not present

## 2020-10-10 NOTE — Telephone Encounter (Signed)
Pt called stating she was told from her PCP to contact Dr. Diona Browner concerning her shortness of breath. PCP increased her lasix and started her on night time oxygen.   States no other symptoms.   [phone]

## 2020-10-10 NOTE — Telephone Encounter (Signed)
Reports seeing PCP on yesterday for SOB. PCP suggested she contact cardiology for an appointment due to SOB. PCP increased lasix by an extra 40 mg tablet daily for 3 days in addition to her regular 60 mg BID. Reports weighing daily. Today 233.4 lbs. Reports usually weighs around 230 lbs. Denies chest pain or dizziness. Appointment given to see Dr. Diona Browner on 10/12/20 at 4:00 pm. Advised if symptoms got worse, to go to the ED for an evaluation. Verbalized understanding of plan.

## 2020-10-11 ENCOUNTER — Encounter: Payer: Self-pay | Admitting: *Deleted

## 2020-10-12 ENCOUNTER — Encounter: Payer: Self-pay | Admitting: *Deleted

## 2020-10-12 ENCOUNTER — Other Ambulatory Visit: Payer: Self-pay

## 2020-10-12 ENCOUNTER — Encounter: Payer: Self-pay | Admitting: Cardiology

## 2020-10-12 ENCOUNTER — Ambulatory Visit: Payer: Medicare PPO | Admitting: Cardiology

## 2020-10-12 VITALS — BP 104/82 | HR 66 | Ht 63.0 in | Wt 224.0 lb

## 2020-10-12 DIAGNOSIS — I25119 Atherosclerotic heart disease of native coronary artery with unspecified angina pectoris: Secondary | ICD-10-CM

## 2020-10-12 DIAGNOSIS — Z953 Presence of xenogenic heart valve: Secondary | ICD-10-CM | POA: Diagnosis not present

## 2020-10-12 DIAGNOSIS — I4819 Other persistent atrial fibrillation: Secondary | ICD-10-CM | POA: Diagnosis not present

## 2020-10-12 NOTE — H&P (View-Only) (Signed)
Cardiology Office Note  Date: 10/12/2020   ID: [name], DOB 1941-05-25, MRN [id number]  PCP:  Ignatius Specking, MD  Cardiologist:  [name], MD Electrophysiologist:  None   Chief Complaint  Patient presents with  . Cardiac follow-up    History of Present Illness: [name] is a 80 y.o. female last seen in January by Mr. Vincenza Hews NP.  She presents back to the office complaining of increasing shortness of breath.  She was seen by her PCP earlier in the week who temporarily increase Lasix dosing.  This helped to improve her leg swelling, but she still remains short of breath with activity.  CHA2DS2-VASc score is 5.  She has been tolerating Eliquis.  She tells me that she did feel better after we first uptitrated her beta-blocker with atrial fibrillation, but has since started getting more short of breath.  Follow-up echocardiogram in January showed LVEF 60 to 65% with normally functioning aortic prosthesis.  Her weight is down recently after intensification of Lasix.  She did have follow-up lab work obtained earlier in the week by PCP.  She does not report any exertional chest pain similar to prior angina.  ECG today shows atrial flutter with right bundle branch block.  We discussed planning on elective cardioversion to see if getting her back in sinus rhythm helps with symptoms.  Past Medical History:  Diagnosis Date  . Aortic stenosis   . Arthritis   . Atrial fibrillation (HCC)   . Coronary atherosclerosis of native coronary artery    Multivessel  . Essential hypertension   . Gout   . Hypothyroidism   . Mixed hyperlipidemia   . Psoriasis     Past Surgical History:  Procedure Laterality Date  . AORTIC VALVE REPLACEMENT  2009   21 mm [facility] pericardial valve  . CHOLECYSTECTOMY     2011  . CORONARY ARTERY BYPASS GRAFT  2009   LIMA to LAD, SVG to RCA  . JOINT REPLACEMENT  2012   L TOTAL KNEE  . TOTAL KNEE ARTHROPLASTY  09/16/2011   Procedure: TOTAL KNEE  ARTHROPLASTY;  Surgeon: Loanne Drilling, MD;  Location: [facility];  Service: Orthopedics;  Laterality: Right;  . TOTAL THYROIDECTOMY  1970    Current Outpatient Medications  Medication Sig Dispense Refill  . allopurinol (ZYLOPRIM) 300 MG tablet Take 300 mg by mouth daily.  3  . apixaban (ELIQUIS) 5 MG TABS tablet Take 1 tablet (5 mg total) by mouth 2 (two) times daily. 60 tablet 0  . atorvastatin (LIPITOR) 10 MG tablet Take 10 mg by mouth daily.    Marland Kitchen docusate sodium (COLACE) 100 MG capsule Take 100 mg by mouth daily.    . ferrous sulfate 325 (65 FE) MG EC tablet Take 325 mg by mouth daily.    . furosemide (LASIX) 40 MG tablet Take 1.5 tablets (60 mg total) by mouth 2 (two) times daily. 60 tablet 3  . levothyroxine (SYNTHROID, LEVOTHROID) 125 MCG tablet Take 125 mcg by mouth daily before breakfast.    . LINZESS 145 MCG CAPS capsule Take 145 mcg by mouth daily.  5  . Multiple Vitamins-Minerals (CENTRUM SILVER PO) Take by mouth daily.    . pantoprazole (PROTONIX) 40 MG tablet Take 40 mg by mouth daily.  4  . potassium chloride SA (K-DUR,KLOR-CON) 20 MEQ tablet Take 20 mEq by mouth daily.    . Vitamin D, Ergocalciferol, (DRISDOL) 50000 UNITS CAPS Take 50,000 Units by mouth every  7 (seven) days.     Marland Kitchen losartan (COZAAR) 25 MG tablet Take 1 tablet (25 mg total) by mouth daily. 90 tablet 3  . metoprolol succinate (TOPROL-XL) 50 MG 24 hr tablet Take 1 tablet (50 mg total) by mouth in the morning and at bedtime. Take with or immediately following a meal. 180 tablet 3   No current facility-administered medications for this visit.   Allergies:  Penicillins and Statins   Social History: The patient  reports that she quit smoking about 31 years ago. She has never used smokeless tobacco. She reports that she does not drink alcohol and does not use drugs.   Family History: The patient's family history includes Aneurysm in her father; Multiple sclerosis in her sister.   ROS: No syncope.  Physical  Exam: VS:  BP 104/82   Pulse 66   Ht 5\' 3"  (1.6 m)   Wt 224 lb (101.6 kg)   LMP  (LMP Unknown)   SpO2 90%   BMI 39.68 kg/m , BMI Body mass index is 39.68 kg/m.  Wt Readings from Last 3 Encounters:  10/12/20 224 lb (101.6 kg)  07/17/20 236 lb 3.2 oz (107.1 kg)  06/26/20 238 lb 9.6 oz (108.2 kg)    General: Patient appears comfortable at rest. HEENT: Conjunctiva and lids normal, wearing a mask. Neck: Supple, no elevated JVP or carotid bruits, no thyromegaly. Lungs: Clear to auscultation, nonlabored breathing at rest. Cardiac: Irregularly irregular, no S3, 2/6 systolic murmur, no pericardial rub. Abdomen: Soft, nontender, bowel sounds present. Extremities: Mild lower leg edema, right greater than left.  ECG:  An ECG dated 06/26/2020 was personally reviewed today and demonstrated:  Atrial fibrillation with right bundle branch block.  Recent Labwork:  January 2022: Hemoglobin 13.7, platelets 262, BUN 36, creatinine 1.43, potassium 4.26 September 2020: BUN 32, creatinine 1.33, potassium 4.5, NT-proBNP 748, hemoglobin 14.2, platelets 200  Other Studies Reviewed Today:  Echocardiogram 06/28/2020: 1. Left ventricular ejection fraction, by estimation, is 60 to 65%. The  left ventricle has normal function. The left ventricle has no regional  wall motion abnormalities. Left ventricular diastolic parameters are  indeterminate.  2. Right ventricular systolic function is normal. The right ventricular  size is normal.  3. Left atrial size was moderately dilated.  4. Right atrial size was moderately dilated.  5. The mitral valve is normal in structure. No evidence of mitral valve  regurgitation. No evidence of mitral stenosis.  6. 21 mm Edwards Magna pericardial valve is in the AV position. . The  aortic valve has been repaired/replaced. Aortic valve regurgitation is not  visualized. No aortic stenosis is present.   Assessment and Plan:  1.  Persistent atrial fibrillation/flutter with  CHA2DS2-VASc score of 5.  She has been compliant with Eliquis, heart rate control is reasonable on current dose of Toprol-XL.  She still remains short of breath, some of which was complicated by fluid overload, but her weight is down somewhat after recent intensification of Lasix and leg swelling has improved.  We have discussed plan for elective cardioversion attempt which will be scheduled for next week.  Hold Toprol-XL on the morning of the procedure.  Hopefully she will notice symptomatic improvement if we can get her back in sinus rhythm.  2.  Multivessel CAD status post CABG, ischemic testing low risk in 2019.  She does not describe any obvious angina at this time.  3.  Status post bioprosthetic AVR for treatment of aortic stenosis, prosthetic function stable by follow-up echocardiogram  in January and LVEF normal at 60 to 65%.  She has moderate biatrial enlargement.  Medication Adjustments/Labs and Tests Ordered: Current medicines are reviewed at length with the patient today.  Concerns regarding medicines are outlined above.   Tests Ordered: Orders Placed This Encounter  Procedures  . EKG 12-Lead    Medication Changes: No orders of the defined types were placed in this encounter.   Disposition:  Follow up 1 month.  Signed, [name], MD, [facility] 10/12/2020 4:39 PM    [facility] at [facility] [address] [place], [place], [place] [address] Phone: [phone]; Fax: [phone]

## 2020-10-12 NOTE — Progress Notes (Signed)
Cardiology Office Note  Date: 10/12/2020   ID: [name], DOB 1941-05-25, MRN [id number]  PCP:  Ignatius Specking, MD  Cardiologist:  [name], MD Electrophysiologist:  None   Chief Complaint  Patient presents with  . Cardiac follow-up    History of Present Illness: [name] is a 80 y.o. female last seen in January by Mr. Vincenza Hews NP.  She presents back to the office complaining of increasing shortness of breath.  She was seen by her PCP earlier in the week who temporarily increase Lasix dosing.  This helped to improve her leg swelling, but she still remains short of breath with activity.  CHA2DS2-VASc score is 5.  She has been tolerating Eliquis.  She tells me that she did feel better after we first uptitrated her beta-blocker with atrial fibrillation, but has since started getting more short of breath.  Follow-up echocardiogram in January showed LVEF 60 to 65% with normally functioning aortic prosthesis.  Her weight is down recently after intensification of Lasix.  She did have follow-up lab work obtained earlier in the week by PCP.  She does not report any exertional chest pain similar to prior angina.  ECG today shows atrial flutter with right bundle branch block.  We discussed planning on elective cardioversion to see if getting her back in sinus rhythm helps with symptoms.  Past Medical History:  Diagnosis Date  . Aortic stenosis   . Arthritis   . Atrial fibrillation (HCC)   . Coronary atherosclerosis of native coronary artery    Multivessel  . Essential hypertension   . Gout   . Hypothyroidism   . Mixed hyperlipidemia   . Psoriasis     Past Surgical History:  Procedure Laterality Date  . AORTIC VALVE REPLACEMENT  2009   21 mm [facility] pericardial valve  . CHOLECYSTECTOMY     2011  . CORONARY ARTERY BYPASS GRAFT  2009   LIMA to LAD, SVG to RCA  . JOINT REPLACEMENT  2012   L TOTAL KNEE  . TOTAL KNEE ARTHROPLASTY  09/16/2011   Procedure: TOTAL KNEE  ARTHROPLASTY;  Surgeon: Loanne Drilling, MD;  Location: [facility];  Service: Orthopedics;  Laterality: Right;  . TOTAL THYROIDECTOMY  1970    Current Outpatient Medications  Medication Sig Dispense Refill  . allopurinol (ZYLOPRIM) 300 MG tablet Take 300 mg by mouth daily.  3  . apixaban (ELIQUIS) 5 MG TABS tablet Take 1 tablet (5 mg total) by mouth 2 (two) times daily. 60 tablet 0  . atorvastatin (LIPITOR) 10 MG tablet Take 10 mg by mouth daily.    Marland Kitchen docusate sodium (COLACE) 100 MG capsule Take 100 mg by mouth daily.    . ferrous sulfate 325 (65 FE) MG EC tablet Take 325 mg by mouth daily.    . furosemide (LASIX) 40 MG tablet Take 1.5 tablets (60 mg total) by mouth 2 (two) times daily. 60 tablet 3  . levothyroxine (SYNTHROID, LEVOTHROID) 125 MCG tablet Take 125 mcg by mouth daily before breakfast.    . LINZESS 145 MCG CAPS capsule Take 145 mcg by mouth daily.  5  . Multiple Vitamins-Minerals (CENTRUM SILVER PO) Take by mouth daily.    . pantoprazole (PROTONIX) 40 MG tablet Take 40 mg by mouth daily.  4  . potassium chloride SA (K-DUR,KLOR-CON) 20 MEQ tablet Take 20 mEq by mouth daily.    . Vitamin D, Ergocalciferol, (DRISDOL) 50000 UNITS CAPS Take 50,000 Units by mouth every  7 (seven) days.     Marland Kitchen losartan (COZAAR) 25 MG tablet Take 1 tablet (25 mg total) by mouth daily. 90 tablet 3  . metoprolol succinate (TOPROL-XL) 50 MG 24 hr tablet Take 1 tablet (50 mg total) by mouth in the morning and at bedtime. Take with or immediately following a meal. 180 tablet 3   No current facility-administered medications for this visit.   Allergies:  Penicillins and Statins   Social History: The patient  reports that she quit smoking about 31 years ago. She has never used smokeless tobacco. She reports that she does not drink alcohol and does not use drugs.   Family History: The patient's family history includes Aneurysm in her father; Multiple sclerosis in her sister.   ROS: No syncope.  Physical  Exam: VS:  BP 104/82   Pulse 66   Ht 5\' 3"  (1.6 m)   Wt 224 lb (101.6 kg)   LMP  (LMP Unknown)   SpO2 90%   BMI 39.68 kg/m , BMI Body mass index is 39.68 kg/m.  Wt Readings from Last 3 Encounters:  10/12/20 224 lb (101.6 kg)  07/17/20 236 lb 3.2 oz (107.1 kg)  06/26/20 238 lb 9.6 oz (108.2 kg)    General: Patient appears comfortable at rest. HEENT: Conjunctiva and lids normal, wearing a mask. Neck: Supple, no elevated JVP or carotid bruits, no thyromegaly. Lungs: Clear to auscultation, nonlabored breathing at rest. Cardiac: Irregularly irregular, no S3, 2/6 systolic murmur, no pericardial rub. Abdomen: Soft, nontender, bowel sounds present. Extremities: Mild lower leg edema, right greater than left.  ECG:  An ECG dated 06/26/2020 was personally reviewed today and demonstrated:  Atrial fibrillation with right bundle branch block.  Recent Labwork:  January 2022: Hemoglobin 13.7, platelets 262, BUN 36, creatinine 1.43, potassium 4.26 September 2020: BUN 32, creatinine 1.33, potassium 4.5, NT-proBNP 748, hemoglobin 14.2, platelets 200  Other Studies Reviewed Today:  Echocardiogram 06/28/2020: 1. Left ventricular ejection fraction, by estimation, is 60 to 65%. The  left ventricle has normal function. The left ventricle has no regional  wall motion abnormalities. Left ventricular diastolic parameters are  indeterminate.  2. Right ventricular systolic function is normal. The right ventricular  size is normal.  3. Left atrial size was moderately dilated.  4. Right atrial size was moderately dilated.  5. The mitral valve is normal in structure. No evidence of mitral valve  regurgitation. No evidence of mitral stenosis.  6. 21 mm Edwards Magna pericardial valve is in the AV position. . The  aortic valve has been repaired/replaced. Aortic valve regurgitation is not  visualized. No aortic stenosis is present.   Assessment and Plan:  1.  Persistent atrial fibrillation/flutter with  CHA2DS2-VASc score of 5.  She has been compliant with Eliquis, heart rate control is reasonable on current dose of Toprol-XL.  She still remains short of breath, some of which was complicated by fluid overload, but her weight is down somewhat after recent intensification of Lasix and leg swelling has improved.  We have discussed plan for elective cardioversion attempt which will be scheduled for next week.  Hold Toprol-XL on the morning of the procedure.  Hopefully she will notice symptomatic improvement if we can get her back in sinus rhythm.  2.  Multivessel CAD status post CABG, ischemic testing low risk in 2019.  She does not describe any obvious angina at this time.  3.  Status post bioprosthetic AVR for treatment of aortic stenosis, prosthetic function stable by follow-up echocardiogram  in January and LVEF normal at 60 to 65%.  She has moderate biatrial enlargement.  Medication Adjustments/Labs and Tests Ordered: Current medicines are reviewed at length with the patient today.  Concerns regarding medicines are outlined above.   Tests Ordered: Orders Placed This Encounter  Procedures  . EKG 12-Lead    Medication Changes: No orders of the defined types were placed in this encounter.   Disposition:  Follow up 1 month.  Signed, Lennyx Verdell G. Ruben Pyka, MD, FACC 10/12/2020 4:39 PM    [facility] [place] [address], [place], [place] [address] Phone: [phone]; Fax: [phone] 

## 2020-10-12 NOTE — Patient Instructions (Addendum)
Medication Instructions:   Your physician recommends that you continue on your current medications as directed. Please refer to the Current Medication list given to you today.  Labwork:  none  Testing/Procedures: Your physician has recommended that you have a Cardioversion (DCCV). Electrical Cardioversion uses a jolt of electricity to your heart either through paddles or wired patches attached to your chest. This is a controlled, usually prescheduled, procedure. Defibrillation is done under light anesthesia in the hospital, and you usually go home the day of the procedure. This is done to get your heart back into a normal rhythm. You are not awake for the procedure. Please see the instruction sheet given to you today. Pre-Admission testing required-you will be contacted with this information   Follow-Up:  Your physician recommends that you schedule a follow-up appointment in: 1 month.  Any Other Special Instructions Will Be Listed Below (If Applicable).  If you need a refill on your cardiac medications before your next appointment, please call your pharmacy.

## 2020-10-13 ENCOUNTER — Other Ambulatory Visit: Payer: Self-pay | Admitting: Cardiology

## 2020-10-13 ENCOUNTER — Telehealth: Payer: Self-pay | Admitting: Cardiology

## 2020-10-13 ENCOUNTER — Telehealth: Payer: Self-pay | Admitting: *Deleted

## 2020-10-13 DIAGNOSIS — I5020 Unspecified systolic (congestive) heart failure: Secondary | ICD-10-CM | POA: Diagnosis not present

## 2020-10-13 DIAGNOSIS — R0602 Shortness of breath: Secondary | ICD-10-CM | POA: Diagnosis not present

## 2020-10-13 MED ORDER — FUROSEMIDE 40 MG PO TABS: ORAL_TABLET | ORAL | 3 refills | Status: DC

## 2020-10-13 NOTE — Telephone Encounter (Signed)
Patient said she had seen Dr. Diona Browner and he was going to increase her furosemide (LASIX) 40 MG tablet. She was trying to get in touch with Isabelle Course to see what she needs to do. Please call her at [phone]. To let her know if he did .

## 2020-10-13 NOTE — Telephone Encounter (Signed)
Pre-Adm & Covid 10/18/20 9:00 am   DCCV 10/20/20 10:00 am

## 2020-10-13 NOTE — Telephone Encounter (Signed)
Patient informed and verbalized understanding of plan. Aware that both appointments are at [facility]

## 2020-10-13 NOTE — Telephone Encounter (Signed)
Increase Lasix to 80 mg in the morning and 60 mg in the afternoon.

## 2020-10-13 NOTE — Telephone Encounter (Signed)
Pt voiced understanding - updated rx sent to pharmacy

## 2020-10-13 NOTE — Telephone Encounter (Signed)
Pt says she is currently taking 120 mg daily of lasix and dicussed increasing this for a few days at recent OV with Diona Browner - says she is SOB denies weight gain and is having oxygen delivered this afternoon - did not see any notes mentioning increasing lasix

## 2020-10-16 ENCOUNTER — Telehealth: Payer: Self-pay | Admitting: Cardiology

## 2020-10-16 NOTE — Telephone Encounter (Signed)
Pre-cert Verification for the following procedure    DCCV 10/20/20 10:00 am     LOCATION: [facility]

## 2020-10-17 NOTE — Patient Instructions (Signed)
[name]  10/17/2020     @PREFPERIOPPHARMACY @   Your procedure is scheduled on  10/20/2020.   Report to 10/22/2020 at  0830  A.M.   Call this number if you have problems the morning of surgery:  [phone]   Remember:  Do not eat or drink after midnight.                         Take these medicines the morning of surgery with A SIP OF WATER  Allopurinol, levothyroxine, protonix.     Please brush your teeth.  Do not wear jewelry, make-up or nail polish.  Do not wear lotions, powders, or perfumes, or deodorant.  Do not shave 48 hours prior to surgery.  Men may shave face and neck.  Do not bring valuables to the hospital.  [facility] is not responsible for any belongings or valuables.  Contacts, dentures or bridgework may not be worn into surgery.  Leave your suitcase in the car.  After surgery it may be brought to your room.  For patients admitted to the hospital, discharge time will be determined by your treatment team.  Patients discharged the day of surgery will not be allowed to drive home and must have someone with them for 24 hours.   Special instructions:  DO NOT smoke tobacco or vape for 24 hours before your procedure.  Please read over the following fact sheets that you were given. Anesthesia Post-op Instructions and Care and Recovery After Surgery       Electrical Cardioversion Electrical cardioversion is the delivery of a jolt of electricity to restore a normal rhythm to the heart. A rhythm that is too fast or is not regular keeps the heart from pumping well. In this procedure, sticky patches or metal paddles are placed on the chest to deliver electricity to the heart from a device. This procedure may be done in an emergency if:  There is low or no blood pressure as a result of the heart rhythm.  Normal rhythm must be restored as fast as possible to protect the brain and heart from further damage.  It may save a life. This may also be a  scheduled procedure for irregular or fast heart rhythms that are not immediately life-threatening. Tell a health care provider about:  Any allergies you have.  All medicines you are taking, including vitamins, herbs, eye drops, creams, and over-the-counter medicines.  Any problems you or family members have had with anesthetic medicines.  Any blood disorders you have.  Any surgeries you have had.  Any medical conditions you have.  Whether you are pregnant or may be pregnant. What are the risks? Generally, this is a safe procedure. However, problems may occur, including:  Allergic reactions to medicines.  A blood clot that breaks free and travels to other parts of your body.  The possible return of an abnormal heart rhythm within hours or days after the procedure.  Your heart stopping (cardiac arrest). This is rare. What happens before the procedure? Medicines  Your health care provider may have you start taking: ? Blood-thinning medicines (anticoagulants) so your blood does not clot as easily. ? Medicines to help stabilize your heart rate and rhythm.  Ask your health care provider about: ? Changing or stopping your regular medicines. This is especially important if you are taking diabetes medicines or blood thinners. ? Taking medicines such as aspirin and ibuprofen. These  medicines can thin your blood. Do not take these medicines unless your health care provider tells you to take them. ? Taking over-the-counter medicines, vitamins, herbs, and supplements. General instructions  Follow instructions from your health care provider about eating or drinking restrictions.  Plan to have someone take you home from the hospital or clinic.  If you will be going home right after the procedure, plan to have someone with you for 24 hours.  Ask your health care provider what steps will be taken to help prevent infection. These may include washing your skin with a germ-killing  soap. What happens during the procedure?  An IV will be inserted into one of your veins.  Sticky patches (electrodes) or metal paddles may be placed on your chest.  You will be given a medicine to help you relax (sedative).  An electrical shock will be delivered. The procedure may vary among health care providers and hospitals.   What can I expect after the procedure?  Your blood pressure, heart rate, breathing rate, and blood oxygen level will be monitored until you leave the hospital or clinic.  Your heart rhythm will be watched to make sure it does not change.  You may have some redness on the skin where the shocks were given. Follow these instructions at home:  Do not drive for 24 hours if you were given a sedative during your procedure.  Take over-the-counter and prescription medicines only as told by your health care provider.  Ask your health care provider how to check your pulse. Check it often.  Rest for 48 hours after the procedure or as told by your health care provider.  Avoid or limit your caffeine use as told by your health care provider.  Keep all follow-up visits as told by your health care provider. This is important. Contact a health care provider if:  You feel like your heart is beating too quickly or your pulse is not regular.  You have a serious muscle cramp that does not go away. Get help right away if:  You have discomfort in your chest.  You are dizzy or you feel faint.  You have trouble breathing or you are short of breath.  Your speech is slurred.  You have trouble moving an arm or leg on one side of your body.  Your fingers or toes turn cold or blue. Summary  Electrical cardioversion is the delivery of a jolt of electricity to restore a normal rhythm to the heart.  This procedure may be done right away in an emergency or may be a scheduled procedure if the condition is not an emergency.  Generally, this is a safe procedure.  After  the procedure, check your pulse often as told by your health care provider. This information is not intended to replace advice given to you by your health care provider. Make sure you discuss any questions you have with your health care provider. Document Revised: 01/11/2019 Document Reviewed: 01/11/2019 Elsevier Patient Education  2021 [employer] Monitored Anesthesia Care, Care After This sheet gives you information about how to care for yourself after your procedure. Your health care provider may also give you more specific instructions. If you have problems or questions, contact your health care provider. What can I expect after the procedure? After the procedure, it is common to have:  Tiredness.  Forgetfulness about what happened after the procedure.  Impaired judgment for important decisions.  Nausea or vomiting.  Some difficulty with balance. Follow these instructions  at home: For the time period you were told by your health care provider:  Rest as needed.  Do not participate in activities where you could fall or become injured.  Do not drive or use machinery.  Do not drink alcohol.  Do not take sleeping pills or medicines that cause drowsiness.  Do not make important decisions or sign legal documents.  Do not take care of children on your own.      Eating and drinking  Follow the diet that is recommended by your health care provider.  Drink enough fluid to keep your urine pale yellow.  If you vomit: ? Drink water, juice, or soup when you can drink without vomiting. ? Make sure you have little or no nausea before eating solid foods. General instructions  Have a responsible adult stay with you for the time you are told. It is important to have someone help care for you until you are awake and alert.  Take over-the-counter and prescription medicines only as told by your health care provider.  If you have sleep apnea, surgery and certain medicines can  increase your risk for breathing problems. Follow instructions from your health care provider about wearing your sleep device: ? Anytime you are sleeping, including during daytime naps. ? While taking prescription pain medicines, sleeping medicines, or medicines that make you drowsy.  Avoid smoking.  Keep all follow-up visits as told by your health care provider. This is important. Contact a health care provider if:  You keep feeling nauseous or you keep vomiting.  You feel light-headed.  You are still sleepy or having trouble with balance after 24 hours.  You develop a rash.  You have a fever.  You have redness or swelling around the IV site. Get help right away if:  You have trouble breathing.  You have new-onset confusion at home. Summary  For several hours after your procedure, you may feel tired. You may also be forgetful and have poor judgment.  Have a responsible adult stay with you for the time you are told. It is important to have someone help care for you until you are awake and alert.  Rest as told. Do not drive or operate machinery. Do not drink alcohol or take sleeping pills.  Get help right away if you have trouble breathing, or if you suddenly become confused. This information is not intended to replace advice given to you by your health care provider. Make sure you discuss any questions you have with your health care provider. Document Revised: 02/24/2020 Document Reviewed: 05/13/2019 Elsevier Patient Education  2021 [employer].

## 2020-10-18 ENCOUNTER — Other Ambulatory Visit (HOSPITAL_COMMUNITY)
Admission: RE | Admit: 2020-10-18 | Discharge: 2020-10-18 | Disposition: A | Discharge status: Home / Self Care | Payer: Medicare PPO | Source: Ambulatory Visit | Attending: Cardiology | Admitting: Cardiology

## 2020-10-18 ENCOUNTER — Encounter (HOSPITAL_COMMUNITY)
Admission: RE | Admit: 2020-10-18 | Discharge: 2020-10-18 | Disposition: A | Discharge status: Home / Self Care | Payer: Medicare PPO | Source: Ambulatory Visit | Attending: Cardiology | Admitting: Cardiology

## 2020-10-18 ENCOUNTER — Encounter (HOSPITAL_COMMUNITY): Payer: Self-pay

## 2020-10-18 ENCOUNTER — Telehealth: Payer: Self-pay | Admitting: *Deleted

## 2020-10-18 ENCOUNTER — Other Ambulatory Visit: Payer: Self-pay

## 2020-10-18 DIAGNOSIS — Z20822 Contact with and (suspected) exposure to covid-19: Secondary | ICD-10-CM | POA: Insufficient documentation

## 2020-10-18 DIAGNOSIS — Z01812 Encounter for preprocedural laboratory examination: Secondary | ICD-10-CM | POA: Diagnosis not present

## 2020-10-18 LAB — BASIC METABOLIC PANEL
Anion gap: 10 (ref 5–15)
BUN: 40 mg/dL — ABNORMAL HIGH (ref 8–23)
CO2: 35 mmol/L — ABNORMAL HIGH (ref 22–32)
Calcium: 8.5 mg/dL — ABNORMAL LOW (ref 8.9–10.3)
Chloride: 94 mmol/L — ABNORMAL LOW (ref 98–111)
Creatinine, Ser: 1.46 mg/dL — ABNORMAL HIGH (ref 0.44–1.00)
GFR, Estimated: 36 mL/min — ABNORMAL LOW (ref >60)
Glucose, Bld: 112 mg/dL — ABNORMAL HIGH (ref 70–99)
Potassium: 3.9 mmol/L (ref 3.5–5.1)
Sodium: 139 mmol/L (ref 135–145)

## 2020-10-18 LAB — CBC WITH DIFFERENTIAL/PLATELET
Abs Immature Granulocytes: 0.04 10*3/uL (ref 0.00–0.07)
Basophils Absolute: 0.1 10*3/uL (ref 0.0–0.1)
Basophils Relative: 1 %
Eosinophils Absolute: 0.3 10*3/uL (ref 0.0–0.5)
Eosinophils Relative: 3 %
HCT: 45 % (ref 36.0–46.0)
Hemoglobin: 13.9 g/dL (ref 12.0–15.0)
Immature Granulocytes: 1 %
Lymphocytes Relative: 16 %
Lymphs Abs: 1.2 10*3/uL (ref 0.7–4.0)
MCH: 32.9 pg (ref 26.0–34.0)
MCHC: 30.9 g/dL (ref 30.0–36.0)
MCV: 106.4 fL — ABNORMAL HIGH (ref 80.0–100.0)
Monocytes Absolute: 0.6 10*3/uL (ref 0.1–1.0)
Monocytes Relative: 8 %
Neutro Abs: 5.5 10*3/uL (ref 1.7–7.7)
Neutrophils Relative %: 71 %
Platelets: 213 10*3/uL (ref 150–400)
RBC: 4.23 MIL/uL (ref 3.87–5.11)
RDW: 14.3 % (ref 11.5–15.5)
WBC: 7.8 10*3/uL (ref 4.0–10.5)
nRBC: 0 % (ref 0.0–0.2)

## 2020-10-18 LAB — PROTIME-INR
INR: 1.3 — ABNORMAL HIGH (ref 0.8–1.2)
Prothrombin Time: 16.4 seconds — ABNORMAL HIGH (ref 11.4–15.2)

## 2020-10-18 LAB — SARS CORONAVIRUS 2 (TAT 6-24 HRS): SARS Coronavirus 2: NEGATIVE

## 2020-10-18 NOTE — Telephone Encounter (Signed)
-----   Message from [name], MD sent at 10/18/2020 11:36 AM EDT ----- Results reviewed.  Creatinine is increased to 1.46 with normal potassium following up titration of Lasix.  Continue same for now pending cardioversion.

## 2020-10-18 NOTE — Telephone Encounter (Signed)
Patient informed. Copy sent to PCP °

## 2020-10-20 ENCOUNTER — Encounter (HOSPITAL_COMMUNITY)
Admission: RE | Disposition: A | Discharge status: Home / Self Care | Payer: Self-pay | Source: Home / Self Care | Attending: Cardiology

## 2020-10-20 ENCOUNTER — Ambulatory Visit (HOSPITAL_COMMUNITY): Payer: Medicare PPO | Admitting: Anesthesiology

## 2020-10-20 ENCOUNTER — Ambulatory Visit (HOSPITAL_COMMUNITY)
Admission: RE | Admit: 2020-10-20 | Discharge: 2020-10-20 | Disposition: A | Discharge status: Home / Self Care | Payer: Medicare PPO | Attending: Cardiology | Admitting: Cardiology

## 2020-10-20 ENCOUNTER — Encounter (HOSPITAL_COMMUNITY): Payer: Self-pay | Admitting: Cardiology

## 2020-10-20 ENCOUNTER — Other Ambulatory Visit: Payer: Self-pay

## 2020-10-20 DIAGNOSIS — Z880 Allergy status to penicillin: Secondary | ICD-10-CM | POA: Insufficient documentation

## 2020-10-20 DIAGNOSIS — Z79890 Hormone replacement therapy (postmenopausal): Secondary | ICD-10-CM | POA: Diagnosis not present

## 2020-10-20 DIAGNOSIS — Z87891 Personal history of nicotine dependence: Secondary | ICD-10-CM | POA: Diagnosis not present

## 2020-10-20 DIAGNOSIS — Z7901 Long term (current) use of anticoagulants: Secondary | ICD-10-CM | POA: Insufficient documentation

## 2020-10-20 DIAGNOSIS — Z953 Presence of xenogenic heart valve: Secondary | ICD-10-CM | POA: Diagnosis not present

## 2020-10-20 DIAGNOSIS — Z6839 Body mass index (BMI) 39.0-39.9, adult: Secondary | ICD-10-CM | POA: Insufficient documentation

## 2020-10-20 DIAGNOSIS — I4819 Other persistent atrial fibrillation: Secondary | ICD-10-CM | POA: Diagnosis not present

## 2020-10-20 DIAGNOSIS — Z79899 Other long term (current) drug therapy: Secondary | ICD-10-CM | POA: Insufficient documentation

## 2020-10-20 DIAGNOSIS — Z888 Allergy status to other drugs, medicaments and biological substances status: Secondary | ICD-10-CM | POA: Diagnosis not present

## 2020-10-20 DIAGNOSIS — Z96653 Presence of artificial knee joint, bilateral: Secondary | ICD-10-CM | POA: Insufficient documentation

## 2020-10-20 DIAGNOSIS — I4892 Unspecified atrial flutter: Secondary | ICD-10-CM | POA: Insufficient documentation

## 2020-10-20 DIAGNOSIS — I2510 Atherosclerotic heart disease of native coronary artery without angina pectoris: Secondary | ICD-10-CM | POA: Diagnosis not present

## 2020-10-20 DIAGNOSIS — Z951 Presence of aortocoronary bypass graft: Secondary | ICD-10-CM | POA: Diagnosis not present

## 2020-10-20 DIAGNOSIS — I4891 Unspecified atrial fibrillation: Secondary | ICD-10-CM | POA: Diagnosis not present

## 2020-10-20 HISTORY — PX: CARDIOVERSION: SHX1299

## 2020-10-20 SURGERY — CARDIOVERSION
Anesthesia: General

## 2020-10-20 MED ORDER — LACTATED RINGERS IV SOLN: INTRAVENOUS | Status: DC

## 2020-10-20 MED ORDER — PHENYLEPHRINE 40 MCG/ML (10ML) SYRINGE FOR IV PUSH (FOR BLOOD PRESSURE SUPPORT)
PREFILLED_SYRINGE | INTRAVENOUS | Status: DC | PRN
  Administered 2020-10-20: 80 ug via INTRAVENOUS

## 2020-10-20 MED ORDER — LIDOCAINE HCL (PF) 2 % IJ SOLN
INTRAMUSCULAR | Status: AC
  Filled 2020-10-20: qty 5

## 2020-10-20 MED ORDER — CHLORHEXIDINE GLUCONATE 0.12 % MT SOLN: 15.0000 mL | Freq: Once | OROMUCOSAL | Status: DC

## 2020-10-20 MED ORDER — PROPOFOL 10 MG/ML IV BOLUS
INTRAVENOUS | Status: AC
  Filled 2020-10-20: qty 20

## 2020-10-20 MED ORDER — ORAL CARE MOUTH RINSE: 15.0000 mL | Freq: Once | OROMUCOSAL | Status: DC

## 2020-10-20 MED ORDER — ATROPINE SULFATE 0.4 MG/ML IV SOSY
PREFILLED_SYRINGE | INTRAVENOUS | Status: DC | PRN
  Administered 2020-10-20 (×2): .4 mg via INTRAVENOUS

## 2020-10-20 MED ORDER — LIDOCAINE 2% (20 MG/ML) 5 ML SYRINGE
INTRAMUSCULAR | Status: DC | PRN
  Administered 2020-10-20: 60 mg via INTRAVENOUS

## 2020-10-20 MED ORDER — METOPROLOL SUCCINATE ER 50 MG PO TB24: 50.0000 mg | ORAL_TABLET | Freq: Every day | ORAL | 3 refills | Status: DC

## 2020-10-20 MED ORDER — HYDROCORTISONE 1 % EX CREA
TOPICAL_CREAM | Freq: Two times a day (BID) | CUTANEOUS | Status: DC | PRN
  Filled 2020-10-20 (×2): qty 28

## 2020-10-20 MED ORDER — PHENYLEPHRINE 40 MCG/ML (10ML) SYRINGE FOR IV PUSH (FOR BLOOD PRESSURE SUPPORT)
PREFILLED_SYRINGE | INTRAVENOUS | Status: AC
  Filled 2020-10-20: qty 10

## 2020-10-20 MED ORDER — PROPOFOL 10 MG/ML IV BOLUS
INTRAVENOUS | Status: DC | PRN
  Administered 2020-10-20: 80 mg via INTRAVENOUS

## 2020-10-20 NOTE — Discharge Instructions (Signed)

## 2020-10-20 NOTE — Anesthesia Postprocedure Evaluation (Signed)
Anesthesia Post Note  Patient: [name]  Procedure(s) Performed: CARDIOVERSION (N/A )  Patient location during evaluation: PACU Anesthesia Type: General Level of consciousness: awake and alert and oriented Pain management: pain level controlled Vital Signs Assessment: post-procedure vital signs reviewed and stable Respiratory status: spontaneous breathing and respiratory function stable Cardiovascular status: blood pressure returned to baseline and stable Postop Assessment: no apparent nausea or vomiting Anesthetic complications: no   No complications documented.   Last Vitals:  Vitals:   10/20/20 1100 10/20/20 1122  BP: (!) 110/53 (!) 150/52  Pulse: 66 (!) 54  Resp: 15 18  Temp:  36.6 C  SpO2: 92% 94%    Last Pain:  Vitals:   10/20/20 1122  TempSrc: Oral  PainSc: 0-No pain                 Eydie Wormley C Xavior Niazi

## 2020-10-20 NOTE — Transfer of Care (Signed)
Immediate Anesthesia Transfer of Care Note  Patient: [name]  Procedure(s) Performed: CARDIOVERSION (N/A )  Patient Location: PACU  Anesthesia Type:General  Level of Consciousness: awake  Airway & Oxygen Therapy: Patient Spontanous Breathing and Patient connected to nasal cannula oxygen  Post-op Assessment: Report given to RN, Post -op Vital signs reviewed and stable, Patient moving all extremities and Patient moving all extremities X 4  Post vital signs: Reviewed and stable  Last Vitals:  Vitals Value Taken Time  BP    Temp    Pulse    Resp    SpO2      Last Pain:  Vitals:   10/20/20 0900  TempSrc: Oral  PainSc: 0-No pain         Complications: No complications documented.

## 2020-10-20 NOTE — Progress Notes (Signed)
Electrical Cardioversion Procedure Note [name] [id number] 12-09-1940  Procedure: Electrical Cardioversion Indications:  Atrial Fibrillation  Procedure Details Consent: Risks of procedure as well as the alternatives and risks of each were explained to the (patient/caregiver).  Consent for procedure obtained. Time Out: Verified patient identification, verified procedure, site/side was marked, verified correct patient position, special equipment/implants available, medications/allergies/relevent history reviewed, required imaging and test results available.  Performed  Patient placed on cardiac monitor, pulse oximetry, supplemental oxygen as necessary.  Sedation given: propofol administered by  Renae Fickle, CRNA Pacer pads placed anterior and posterior chest.  Cardioverted 1 time(s).  Cardioverted at 150J.  Evaluation Findings: Post procedure EKG shows: NSR with occasional PVS, RBBB Complications: prolonged pause after cardioversion requiring atropine Patient did tolerate procedure well.   Trenton Gammon S 10/20/2020, 10:47 AM

## 2020-10-20 NOTE — Anesthesia Procedure Notes (Signed)
Date/Time: 10/20/2020 9:39 AM Performed by: Julian Reil, CRNA Pre-anesthesia Checklist: Patient identified, Emergency Drugs available, Suction available and Patient being monitored Patient Re-evaluated:Patient Re-evaluated prior to induction Induction Type: IV induction Placement Confirmation: positive ETCO2

## 2020-10-20 NOTE — Interval H&P Note (Signed)
History and Physical Interval Note:  10/20/2020 9:38 AM  [name]  has presented today for surgery, with the diagnosis of a-fib.  The various methods of treatment have been discussed with the patient and family. After consideration of risks, benefits and other options for treatment, the patient has consented to  Procedure(s): CARDIOVERSION (N/A) as a surgical intervention.  The patient's history has been reviewed, patient examined, no change in status, stable for surgery.  I have reviewed the patient's chart and labs.  Questions were answered to the patient's satisfaction.     [name]

## 2020-10-20 NOTE — Anesthesia Preprocedure Evaluation (Signed)
Anesthesia Evaluation  Patient identified by MRN, date of birth, ID band Patient awake    Reviewed: Allergy & Precautions, NPO status , Patient's Chart, lab work & pertinent test results  Airway Mallampati: II  TM Distance: >3 FB Neck ROM: Full    Dental  (+) Edentulous Upper, Edentulous Lower   Pulmonary former smoker,    Pulmonary exam normal breath sounds clear to auscultation       Cardiovascular Exercise Tolerance: Good hypertension, Pt. on medications + CAD and + CABG  Normal cardiovascular exam+ dysrhythmias Atrial Fibrillation + Valvular Problems/Murmurs (AVR) AS  Rhythm:Regular Rate:Normal  Results reviewed.  Normal LVEF.  Aortic prosthesis with normal function, mean gradient 10 mmHg.  No aortic regurgitation.  Let her know that the heart valve looks good and we will continue with our current treatment plan for atrial fibrillation   Neuro/Psych negative neurological ROS  negative psych ROS   GI/Hepatic negative GI ROS, Neg liver ROS,   Endo/Other  Hypothyroidism Morbid obesity  Renal/GU negative Renal ROS     Musculoskeletal  (+) Arthritis ,   Abdominal   Peds  Hematology   Anesthesia Other Findings   Reproductive/Obstetrics                           Anesthesia Physical Anesthesia Plan  ASA: III  Anesthesia Plan: General   Post-op Pain Management:    Induction: Intravenous  PONV Risk Score and Plan: Propofol infusion  Airway Management Planned: Nasal Cannula, Natural Airway and Simple Face Mask  Additional Equipment:   Intra-op Plan:   Post-operative Plan:   Informed Consent: I have reviewed the patients History and Physical, chart, labs and discussed the procedure including the risks, benefits and alternatives for the proposed anesthesia with the patient or authorized representative who has indicated his/her understanding and acceptance.     Dental advisory  given  Plan Discussed with: CRNA and Surgeon  Anesthesia Plan Comments:         Anesthesia Quick Evaluation

## 2020-10-20 NOTE — CV Procedure (Signed)
Elective direct-current cardioversion  Indication: Persistent atrial fibrillation/flutter  Description of procedure: After informed consent was obtained the patient was taken to the PACU where a timeout was performed.  Anterior and posterior pads were placed in standard fashion and connected to a biphasic defibrillator.  Deep sedation was achieved per the Anesthesia service with use of propofol, please refer to their records for details of dose administration and monitoring.  After adequate sedation was achieved, a sandbag was placed on the anterior chest pad.  A single, synchronized 150 J shock was then delivered.  Following this patient had a prolonged episode of asystole with slow return of junctional beats and then sinus beats, was administered a total of 0.8 mg atropine, did not require resuscitative measures otherwise.  After approximately 30 seconds of gradual return to sinus beats, consistent sinus rhythm was obtained and patient was otherwise hemodynamically stable.  Sinus rhythm with right bundle branch block confirmed by ECG.  Patient awake and conversant thereafter, there were no obvious immediate complications otherwise.  [name], M.D., F.A.C.C.

## 2020-10-23 ENCOUNTER — Telehealth: Payer: Self-pay | Admitting: Cardiology

## 2020-10-23 DIAGNOSIS — Z299 Encounter for prophylactic measures, unspecified: Secondary | ICD-10-CM | POA: Diagnosis not present

## 2020-10-23 DIAGNOSIS — I10 Essential (primary) hypertension: Secondary | ICD-10-CM | POA: Diagnosis not present

## 2020-10-23 DIAGNOSIS — I5033 Acute on chronic diastolic (congestive) heart failure: Secondary | ICD-10-CM | POA: Diagnosis not present

## 2020-10-23 DIAGNOSIS — R0600 Dyspnea, unspecified: Secondary | ICD-10-CM | POA: Diagnosis not present

## 2020-10-23 NOTE — Telephone Encounter (Signed)
Pt aware and will contact Dr Sherril Croon office

## 2020-10-23 NOTE — Telephone Encounter (Signed)
Patient had cardioversion on 10-20-2020. Matilde Haymaker who is her sitter called stating that patient has felt terrible all weekend. States that her oxygen levels continue to drop down in the 70's . EMS was called on 10/22/2020. She was instructed to wear her oxygen at all times. Truddie Hidden states that everytime she gets up it drops and she starts shaking.  Please call [phone].

## 2020-10-23 NOTE — Telephone Encounter (Signed)
Thank you for the update.  I am sorry that she is not feeling well.  Cardioversion was successful and I hope that based on her present heart rate in the 80s she is still in sinus rhythm.  She should not be having hypoxia related to her heart rhythm and I would agree with using oxygen more consistently and following up with PCP.

## 2020-10-23 NOTE — Telephone Encounter (Signed)
Pt says over the weekend her O2 70%  when she gets up and move also c/o weakness and shaking - O2 when wearing 2L of O2 stays around 94% - EMS was called yesterday who suggested she wear oxygen all the time instead of at night - HR 82 per EMS her "heart sounded good" did not suggest she go to ED - denies palpitations/dizziness/chest pain - had cardioversion 4/29 - pt thinks this is from cardioversion - suggested pt contact Dr Sherril Croon who manages oxygen and will forward to provider with any further recs

## 2020-10-24 ENCOUNTER — Emergency Department (HOSPITAL_COMMUNITY)
Admission: EM | Admit: 2020-10-24 | Discharge: 2020-10-25 | Disposition: A | Discharge status: Home / Self Care | Payer: Medicare PPO | Attending: Emergency Medicine | Admitting: Emergency Medicine

## 2020-10-24 ENCOUNTER — Encounter (HOSPITAL_COMMUNITY): Payer: Self-pay

## 2020-10-24 ENCOUNTER — Other Ambulatory Visit: Payer: Self-pay

## 2020-10-24 ENCOUNTER — Emergency Department (HOSPITAL_COMMUNITY): Payer: Medicare PPO

## 2020-10-24 DIAGNOSIS — M7989 Other specified soft tissue disorders: Secondary | ICD-10-CM | POA: Insufficient documentation

## 2020-10-24 DIAGNOSIS — I130 Hypertensive heart and chronic kidney disease with heart failure and stage 1 through stage 4 chronic kidney disease, or unspecified chronic kidney disease: Secondary | ICD-10-CM | POA: Diagnosis not present

## 2020-10-24 DIAGNOSIS — Z96652 Presence of left artificial knee joint: Secondary | ICD-10-CM | POA: Insufficient documentation

## 2020-10-24 DIAGNOSIS — R0602 Shortness of breath: Secondary | ICD-10-CM | POA: Diagnosis not present

## 2020-10-24 DIAGNOSIS — Z79899 Other long term (current) drug therapy: Secondary | ICD-10-CM | POA: Insufficient documentation

## 2020-10-24 DIAGNOSIS — I4891 Unspecified atrial fibrillation: Secondary | ICD-10-CM | POA: Diagnosis not present

## 2020-10-24 DIAGNOSIS — Z7901 Long term (current) use of anticoagulants: Secondary | ICD-10-CM | POA: Diagnosis not present

## 2020-10-24 DIAGNOSIS — E039 Hypothyroidism, unspecified: Secondary | ICD-10-CM | POA: Diagnosis not present

## 2020-10-24 DIAGNOSIS — Z951 Presence of aortocoronary bypass graft: Secondary | ICD-10-CM | POA: Insufficient documentation

## 2020-10-24 DIAGNOSIS — N189 Chronic kidney disease, unspecified: Secondary | ICD-10-CM

## 2020-10-24 DIAGNOSIS — R0600 Dyspnea, unspecified: Secondary | ICD-10-CM | POA: Diagnosis not present

## 2020-10-24 DIAGNOSIS — I2510 Atherosclerotic heart disease of native coronary artery without angina pectoris: Secondary | ICD-10-CM | POA: Diagnosis not present

## 2020-10-24 DIAGNOSIS — Z87891 Personal history of nicotine dependence: Secondary | ICD-10-CM | POA: Insufficient documentation

## 2020-10-24 DIAGNOSIS — R404 Transient alteration of awareness: Secondary | ICD-10-CM | POA: Diagnosis not present

## 2020-10-24 DIAGNOSIS — I10 Essential (primary) hypertension: Secondary | ICD-10-CM | POA: Diagnosis not present

## 2020-10-24 DIAGNOSIS — I509 Heart failure, unspecified: Secondary | ICD-10-CM | POA: Diagnosis not present

## 2020-10-24 DIAGNOSIS — I517 Cardiomegaly: Secondary | ICD-10-CM | POA: Diagnosis not present

## 2020-10-24 DIAGNOSIS — I959 Hypotension, unspecified: Secondary | ICD-10-CM | POA: Diagnosis not present

## 2020-10-24 LAB — CBC
HCT: 42.7 % (ref 36.0–46.0)
Hemoglobin: 12.9 g/dL (ref 12.0–15.0)
MCH: 33.2 pg (ref 26.0–34.0)
MCHC: 30.2 g/dL (ref 30.0–36.0)
MCV: 109.8 fL — ABNORMAL HIGH (ref 80.0–100.0)
Platelets: 158 10*3/uL (ref 150–400)
RBC: 3.89 MIL/uL (ref 3.87–5.11)
RDW: 14.2 % (ref 11.5–15.5)
WBC: 8.5 10*3/uL (ref 4.0–10.5)
nRBC: 0 % (ref 0.0–0.2)

## 2020-10-24 LAB — TROPONIN I (HIGH SENSITIVITY)
Troponin I (High Sensitivity): 19 ng/L — ABNORMAL HIGH (ref <18)
Troponin I (High Sensitivity): 19 ng/L — ABNORMAL HIGH (ref <18)

## 2020-10-24 LAB — COMPREHENSIVE METABOLIC PANEL
ALT: 13 U/L (ref 0–44)
AST: 23 U/L (ref 15–41)
Albumin: 3.5 g/dL (ref 3.5–5.0)
Alkaline Phosphatase: 53 U/L (ref 38–126)
Anion gap: 9 (ref 5–15)
BUN: 43 mg/dL — ABNORMAL HIGH (ref 8–23)
CO2: 38 mmol/L — ABNORMAL HIGH (ref 22–32)
Calcium: 9.1 mg/dL (ref 8.9–10.3)
Chloride: 94 mmol/L — ABNORMAL LOW (ref 98–111)
Creatinine, Ser: 1.66 mg/dL — ABNORMAL HIGH (ref 0.44–1.00)
GFR, Estimated: 31 mL/min — ABNORMAL LOW (ref >60)
Glucose, Bld: 95 mg/dL (ref 70–99)
Potassium: 4 mmol/L (ref 3.5–5.1)
Sodium: 141 mmol/L (ref 135–145)
Total Bilirubin: 0.8 mg/dL (ref 0.3–1.2)
Total Protein: 6.8 g/dL (ref 6.5–8.1)

## 2020-10-24 LAB — D-DIMER, QUANTITATIVE: D-Dimer, Quant: 0.36 ug/mL-FEU (ref 0.00–0.50)

## 2020-10-24 LAB — BRAIN NATRIURETIC PEPTIDE: B Natriuretic Peptide: 276 pg/mL — ABNORMAL HIGH (ref 0.0–100.0)

## 2020-10-24 MED ORDER — ALBUTEROL SULFATE HFA 108 (90 BASE) MCG/ACT IN AERS
4.0000 | INHALATION_SPRAY | Freq: Once | RESPIRATORY_TRACT | Status: AC
  Administered 2020-10-24: 4 via RESPIRATORY_TRACT
  Filled 2020-10-24: qty 6.7

## 2020-10-24 NOTE — Discharge Instructions (Addendum)
It was our pleasure to provide your ER care today - we hope that you feel better.  Follow up with your doctor/cardiologist in the next 1-2 days - call office this AM to arrange appointment.  Discuss possible ECHO then.  Also, have them recheck your kidney function tests.   Also, follow up with pulmonary/lung doctor in the next 1-2 weeks - call to arrange appointment.   Return to ER right away if worse, new symptoms, fevers, chest pain, increased trouble breathing, fainting, or other concern.

## 2020-10-24 NOTE — ED Provider Notes (Signed)
[facility] EMERGENCY DEPARTMENT Provider Note   CSN: [id number] Arrival date & time: 10/24/20  1849     History Chief Complaint  Patient presents with  . Shortness of Breath    [name] is a 80 y.o. female.  Patient with hx chf, c/o persistent sob for the past past several weeks. Symptoms gradual onset, constant, moderate, persistent, slowly worse.  States hadnt been on home o2 previously, but has been for the past month. States recent cardioversion for same, but sob no better. Also states saw pcp Friday and had diuretic dose increased, but doesn't notice any positive change since. States stable bilateral leg swelling r>l - states that way ever since vein harvested from right leg many years ago. Denies hx copd. Non smoker. Denies any current or recent chest pain or discomfort. Indicates has been compliant w her eliquis for afib. Denies recent blood loss/melena. Is urinating normal amount. +orthopnea/long standing.   The history is provided by the patient.  Shortness of Breath Associated symptoms: no abdominal pain, no chest pain, no cough, no fever, no headaches, no neck pain, no rash, no sore throat and no vomiting        Past Medical History:  Diagnosis Date  . Aortic stenosis   . Arthritis   . Atrial fibrillation (HCC)   . Coronary atherosclerosis of native coronary artery    Multivessel  . Essential hypertension   . Gout   . Hypothyroidism   . Mixed hyperlipidemia   . Psoriasis     Patient Active Problem List   Diagnosis Date Noted  . Persistent atrial fibrillation (HCC)   . Bilateral leg edema 03/16/2014  . CORONARY ATHEROSCLEROSIS NATIVE CORONARY ARTERY 09/01/2009  . HYPERLIPIDEMIA-MIXED 04/06/2009  . Essential hypertension, benign 04/06/2009  . Aortic valve disease 09/04/2007    Past Surgical History:  Procedure Laterality Date  . AORTIC VALVE REPLACEMENT  2009   21 mm [facility] pericardial valve  . CHOLECYSTECTOMY     2011  . CORONARY ARTERY  BYPASS GRAFT  2009   LIMA to LAD, SVG to RCA  . JOINT REPLACEMENT  2012   L TOTAL KNEE  . TOTAL KNEE ARTHROPLASTY  09/16/2011   Procedure: TOTAL KNEE ARTHROPLASTY;  Surgeon: Loanne Drilling, MD;  Location: [facility] ORS;  Service: Orthopedics;  Laterality: Right;  . TOTAL THYROIDECTOMY  1970     OB History   No obstetric history on file.     Family History  Problem Relation Age of Onset  . Aneurysm Father        Abdominal  . Multiple sclerosis Sister     Social History   Tobacco Use  . Smoking status: Former Smoker    Quit date: 06/27/1989    Years since quitting: 31.3  . Smokeless tobacco: Never Used  Substance Use Topics  . Alcohol use: No    Alcohol/week: 0.0 standard drinks  . Drug use: No    Home Medications Prior to Admission medications   Medication Sig Start Date End Date Taking? Authorizing Provider  acetaminophen (TYLENOL) 650 MG CR tablet Take 1,300 mg by mouth daily.    [provider]  allopurinol (ZYLOPRIM) 300 MG tablet Take 300 mg by mouth daily. 10/10/16   [provider]  apixaban (ELIQUIS) 5 MG TABS tablet Take 1 tablet (5 mg total) by mouth 2 (two) times daily. 07/17/20   Netta Neat., NP  atorvastatin (LIPITOR) 10 MG tablet Take 10 mg by mouth daily.  [provider]  docusate sodium (COLACE) 100 MG capsule Take 100 mg by mouth at bedtime.    [provider]  fenofibrate (TRICOR) 145 MG tablet Take 145 mg by mouth daily. 09/18/20   [provider]  ferrous sulfate 325 (65 FE) MG EC tablet Take 325 mg by mouth at bedtime.    [provider]  furosemide (LASIX) 40 MG tablet TAKE 80MG  IN THE MORNING AND 60 MG IN THE AFTERNOON Patient taking differently: Take 60 mg by mouth 2 (two) times daily. 10/13/20   10/15/20, MD  levothyroxine (SYNTHROID, LEVOTHROID) 125 MCG tablet Take 125 mcg by mouth daily before breakfast.    [provider]  losartan (COZAAR) 25 MG tablet Take 1 tablet (25 mg  total) by mouth daily. 06/26/20 09/24/20  11/24/20, MD  metoprolol succinate (TOPROL-XL) 50 MG 24 hr tablet Take 1 tablet (50 mg total) by mouth daily. Take with or immediately following a meal. 10/20/20 01/18/21  01/20/21, MD  Multiple Vitamins-Minerals (CENTRUM SILVER PO) Take 1 tablet by mouth at bedtime.    [provider]  pantoprazole (PROTONIX) 40 MG tablet Take 40 mg by mouth daily. 10/21/16   [provider]  potassium chloride SA (K-DUR,KLOR-CON) 20 MEQ tablet Take 20 mEq by mouth daily.    [provider]  Vitamin D, Ergocalciferol, (DRISDOL) 50000 UNITS CAPS Take 50,000 Units by mouth every Sunday. 09/04/11   [provider]  ezetimibe (ZETIA) 10 MG tablet Take 10 mg by mouth daily.    09/02/11  [provider]    Allergies    Statins and Penicillins  Review of Systems   Review of Systems  Constitutional: Negative for fever.  HENT: Negative for sore throat.   Eyes: Negative for redness.  Respiratory: Positive for shortness of breath. Negative for cough.   Cardiovascular: Negative for chest pain and palpitations.  Gastrointestinal: Negative for abdominal pain, nausea and vomiting.  Genitourinary: Negative for flank pain.  Musculoskeletal: Negative for back pain and neck pain.  Skin: Negative for rash.  Neurological: Negative for headaches.  Hematological: Does not bruise/bleed easily.  Psychiatric/Behavioral: Negative for confusion.    Physical Exam Updated Vital Signs BP (!) 118/94   Pulse 64   Temp 98.4 F (36.9 C) (Oral)   Resp 18   Ht 1.6 m (5\' 3" )   Wt 104.3 kg   LMP  (LMP Unknown)   SpO2 100%   BMI 40.73 kg/m   Physical Exam Vitals and nursing note reviewed.  Constitutional:      Appearance: Normal appearance. She is well-developed.  HENT:     Head: Atraumatic.     Nose: Nose normal.     Mouth/Throat:     Mouth: Mucous membranes are moist.  Eyes:     General: No scleral icterus.     Conjunctiva/sclera: Conjunctivae normal.  Neck:     Trachea: No tracheal deviation.  Cardiovascular:     Rate and Rhythm: Normal rate and regular rhythm.     Pulses: Normal pulses.     Heart sounds: Normal heart sounds. No murmur heard. No friction rub. No gallop.   Pulmonary:     Effort: Pulmonary effort is normal. No respiratory distress.     Breath sounds: Rales present.  Abdominal:     General: Bowel sounds are normal. There is no distension.     Palpations: Abdomen is soft.     Tenderness: There is no abdominal tenderness. There  is no guarding.  Genitourinary:    Comments: No cva tenderness.  Musculoskeletal:        General: No swelling.     Cervical back: Normal range of motion and neck supple. No rigidity. No muscular tenderness.     Comments: Bilateral leg swelling, r>l (pt indicates is baseline for her).  Skin:    General: Skin is warm and dry.     Findings: No rash.  Neurological:     Mental Status: She is alert.     Comments: Alert, speech normal.   Psychiatric:        Mood and Affect: Mood normal.     ED Results / Procedures / Treatments   Labs (all labs ordered are listed, but only abnormal results are displayed) Results for orders placed or performed during the hospital encounter of 10/24/20  CBC  Result Value Ref Range   WBC 8.5 4.0 - 10.5 K/uL   RBC 3.89 3.87 - 5.11 MIL/uL   Hemoglobin 12.9 12.0 - 15.0 g/dL   HCT 54.2 70.6 - 23.7 %   MCV 109.8 (H) 80.0 - 100.0 fL   MCH 33.2 26.0 - 34.0 pg   MCHC 30.2 30.0 - 36.0 g/dL   RDW 62.8 31.5 - 17.6 %   Platelets 158 150 - 400 K/uL   nRBC 0.0 0.0 - 0.2 %  Comprehensive metabolic panel  Result Value Ref Range   Sodium 141 135 - 145 mmol/L   Potassium 4.0 3.5 - 5.1 mmol/L   Chloride 94 (L) 98 - 111 mmol/L   CO2 38 (H) 22 - 32 mmol/L   Glucose, Bld 95 70 - 99 mg/dL   BUN 43 (H) 8 - 23 mg/dL   Creatinine, Ser 1.60 (H) 0.44 - 1.00 mg/dL   Calcium 9.1 8.9 - 73.7 mg/dL   Total Protein 6.8 6.5 - 8.1 g/dL    Albumin 3.5 3.5 - 5.0 g/dL   AST 23 15 - 41 U/L   ALT 13 0 - 44 U/L   Alkaline Phosphatase 53 38 - 126 U/L   Total Bilirubin 0.8 0.3 - 1.2 mg/dL   GFR, Estimated 31 (L) >60 mL/min   Anion gap 9 5 - 15  Brain natriuretic peptide  Result Value Ref Range   B Natriuretic Peptide 276.0 (H) 0.0 - 100.0 pg/mL  D-dimer, quantitative  Result Value Ref Range   D-Dimer, Quant 0.36 0.00 - 0.50 ug/mL-FEU  Troponin I (High Sensitivity)  Result Value Ref Range   Troponin I (High Sensitivity) 19 (H) <18 ng/L  Troponin I (High Sensitivity)  Result Value Ref Range   Troponin I (High Sensitivity) 19 (H) <18 ng/L    EKG EKG Interpretation  Date/Time:  Tuesday Oct 24 2020 19:15:18 EDT Ventricular Rate:  64 PR Interval:  157 QRS Duration: 159 QT Interval:  440 QTC Calculation: 454 R Axis:   78 Text Interpretation: Sinus rhythm Ventricular premature complex Right bundle branch block Baseline wander in lead(s) I III aVL Confirmed by Cathren Laine ([id number]) on 10/24/2020 7:36:46 PM   Radiology DG Chest Port 1 View  Result Date: 10/24/2020 CLINICAL DATA:  Dyspnea EXAM: PORTABLE CHEST 1 VIEW COMPARISON:  02/22/2011 FINDINGS: Lung volumes are small. Coarsening of the pulmonary interstitium noted, nonspecific and likely chronic in nature. No confluent pulmonary infiltrate. No pneumothorax or pleural effusion. Coronary artery bypass grafting and aortic valve replacement have been performed. Mild cardiomegaly has developed in the interval. Pulmonary vascularity is normal. No acute bone abnormality. IMPRESSION: Interval  development of mild cardiomegaly. No radiographic evidence of acute cardiopulmonary disease. Electronically Signed   By: Helyn Numbers MD   On: 10/24/2020 19:49    Procedures Procedures   Medications Ordered in ED Medications - No data to display  ED Course  I have reviewed the triage vital signs and the nursing notes.  Pertinent labs & imaging results that were available during my care  of the patient were reviewed by me and considered in my medical decision making (see chart for details).    MDM Rules/Calculators/A&P                         Iv ns. Continuous pulse ox and cardiac monitoring. Stat labs and imaging.   Reviewed nursing notes and prior charts for additional history.   CXR reviewed/interpreted by me - no pna.   Labs reviewed/interpreted by me - wbc normal. bnp sl elevated.   Additional labs reviewed/interpreted by me - delta trop normal and not increasing. Pt denies any chest pain or discomfort. Felt not c/w acs.   Ddimer is normal.  Patient with mild ckd, prior creatinines this year in 1.4-1.5 range, today 1.6 after recently taking extra of her diuretic.   Mild wheezing on recheck, albuterol tx.  No hx copd.   Pt w episode mild chills/shakes, UA pending. No pna on cxr. No increased cough. No abd pain or tenderness.  No skin lesions/rash/cellulitis. Await UA.   If UA neg, anticipate d/c with instructions to f/u very closely with her cardiologist and pcp this week. Signed out to oncoming EDP.      Final Clinical Impression(s) / ED Diagnoses Final diagnoses:  None    Rx / DC Orders ED Discharge Orders    None       Cathren Laine, MD 10/24/20 2316

## 2020-10-24 NOTE — ED Notes (Signed)
Ambulated pt to restroom. Pt did c/o mild dyspnea on exertion, however pt's oxygen sats remained >92% on baseline 2L.

## 2020-10-24 NOTE — ED Triage Notes (Signed)
BIB EMS c/o increasing SOB over the last few days and increased BLE edema.

## 2020-10-24 NOTE — ED Notes (Signed)
ED Provider at bedside. 

## 2020-10-25 ENCOUNTER — Encounter (HOSPITAL_COMMUNITY): Payer: Self-pay | Admitting: Cardiology

## 2020-10-25 LAB — URINALYSIS, ROUTINE W REFLEX MICROSCOPIC
Bilirubin Urine: NEGATIVE
Glucose, UA: NEGATIVE mg/dL
Hgb urine dipstick: NEGATIVE
Ketones, ur: NEGATIVE mg/dL
Leukocytes,Ua: NEGATIVE
Nitrite: NEGATIVE
Protein, ur: NEGATIVE mg/dL
Specific Gravity, Urine: 1.006 (ref 1.005–1.030)
pH: 5 (ref 5.0–8.0)

## 2020-10-25 NOTE — ED Provider Notes (Signed)
Assumed patient care from previous provider.  Please refer to their note for full HPI.  Briefly this is a 80 year old female who presented with worsening shortness of breath over the past couple weeks.  She is O2 dependent.  Work-up has been baseline and reassuring, no signs of acute ACS/CHF.  D-dimer was negative, chest x-ray was stable.  There was report of chills so we are pending a urinalysis. Physical Exam  BP (!) 128/51   Pulse 75   Temp 98.2 F (36.8 C)   Resp 18   Ht 5\' 3"  (1.6 m)   Wt 104.3 kg   LMP  (LMP Unknown)   SpO2 94%   BMI 40.73 kg/m   Physical Exam Vitals and nursing note reviewed.  Constitutional:      Appearance: Normal appearance.  HENT:     Head: Normocephalic.     Mouth/Throat:     Mouth: Mucous membranes are moist.  Cardiovascular:     Rate and Rhythm: Normal rate.  Pulmonary:     Effort: Pulmonary effort is normal. No respiratory distress.     Breath sounds: Examination of the right-lower field reveals decreased breath sounds. Examination of the left-lower field reveals decreased breath sounds. Decreased breath sounds present.  Abdominal:     Palpations: Abdomen is soft.     Tenderness: There is no abdominal tenderness.  Skin:    General: Skin is warm.  Neurological:     Mental Status: She is alert and oriented to person, place, and time. Mental status is at baseline.  Psychiatric:        Mood and Affect: Mood normal.     ED Course/Procedures     Procedures  MDM  Urinalysis shows no infection.  Reevaluated the patient and she appears appropriate for discharge home with supplemental oxygen.  Daughter is at bedside, they understand results and return to ED precautions.  Patient will be discharged and treated as an outpatient.  Discharge plan and strict return to ED precautions discussed, patient verbalizes understanding and agreement.       , DO 10/25/20 0106

## 2020-10-27 ENCOUNTER — Telehealth: Payer: Self-pay | Admitting: Pulmonary Disease

## 2020-10-27 NOTE — Telephone Encounter (Signed)
Dr Sherene Sires, please advise   Pt states she was told to have a HFU 1 wk after being discharged. Is unable to go to [facility]. Is scheduled on 5/31 w/ Wert @ 2:30. Is this soon enough or do we need to check and see if Vassie Loll is willing to do an appt. Her # is [phone]    By [place]

## 2020-10-30 NOTE — Telephone Encounter (Signed)
As long as heading in the right direction ok to keep scheduled appt, otherwise will need to be worked in sooner

## 2020-10-30 NOTE — Telephone Encounter (Signed)
Spoke with pt who states is now requiring O2 at all times vs just qhs prior to going into hospital. Pt was scheduled to see Dr. Sherene Sires in [place] office on 11/02/20 at 2pm. Pt stated she would be able to come to [place] office. Nothing further needed at this time.

## 2020-10-31 DIAGNOSIS — I10 Essential (primary) hypertension: Secondary | ICD-10-CM | POA: Diagnosis not present

## 2020-10-31 DIAGNOSIS — I25119 Atherosclerotic heart disease of native coronary artery with unspecified angina pectoris: Secondary | ICD-10-CM | POA: Diagnosis not present

## 2020-10-31 DIAGNOSIS — I5032 Chronic diastolic (congestive) heart failure: Secondary | ICD-10-CM | POA: Diagnosis not present

## 2020-10-31 DIAGNOSIS — I4891 Unspecified atrial fibrillation: Secondary | ICD-10-CM | POA: Diagnosis not present

## 2020-10-31 DIAGNOSIS — Z299 Encounter for prophylactic measures, unspecified: Secondary | ICD-10-CM | POA: Diagnosis not present

## 2020-11-02 ENCOUNTER — Other Ambulatory Visit: Payer: Self-pay

## 2020-11-02 ENCOUNTER — Encounter: Payer: Self-pay | Admitting: Internal Medicine

## 2020-11-02 ENCOUNTER — Ambulatory Visit: Payer: Medicare PPO | Admitting: Internal Medicine

## 2020-11-02 ENCOUNTER — Telehealth: Payer: Self-pay | Admitting: Internal Medicine

## 2020-11-02 VITALS — BP 136/64 | HR 60 | Temp 97.3°F | Ht 63.0 in | Wt 234.0 lb

## 2020-11-02 DIAGNOSIS — J449 Chronic obstructive pulmonary disease, unspecified: Secondary | ICD-10-CM

## 2020-11-02 DIAGNOSIS — R600 Localized edema: Secondary | ICD-10-CM | POA: Diagnosis not present

## 2020-11-02 DIAGNOSIS — J9611 Chronic respiratory failure with hypoxia: Secondary | ICD-10-CM | POA: Diagnosis not present

## 2020-11-02 DIAGNOSIS — J9612 Chronic respiratory failure with hypercapnia: Secondary | ICD-10-CM | POA: Diagnosis not present

## 2020-11-02 NOTE — Progress Notes (Signed)
[name], female    DOB: 06/19/1941,   MRN: [id number]   Brief patient profile:  59 yowf  Quit smoking  1991 @  220 and did fine breathing including cabg/AVR 2009 until 2015 With afib onset of sob assoc with doe ever since and worse since early 2022 so referred to pulmonary clinic 11/02/2020 by Dr  Sherril Croon.     History of Present Illness  11/02/2020  Pulmonary/ 1st office eval/Sachit Gilman  Chief Complaint  Patient presents with  . Pulmonary Consult    Referred by Dr Sherril Croon. Pt wanting POC. Has been on o2 24/7 for the past 3 wks.   Dyspnea:  75 ft / rides cart at grocery store x years Cough: no  Sleep: in recliner x 30 degrees x years  SABA use: none  No obvious day to day or daytime variability or assoc excess/ purulent sputum or mucus plugs or hemoptysis or cp or chest tightness, subjective wheeze or overt sinus or hb symptoms.   Sleeping as above without nocturnal  or early am exacerbation  of respiratory  c/o's or need for noct saba. Also denies any obvious fluctuation of symptoms with weather or environmental changes or other aggravating or alleviating factors except as outlined above   No unusual exposure hx or h/o childhood pna/ asthma or knowledge of premature birth.  Current Allergies, Complete Past Medical History, Past Surgical History, Family History, and Social History were reviewed in [employer] [facility] record.  ROS  The following are not active complaints unless bolded Hoarseness, sore throat, dysphagia, dental problems, itching, sneezing,  nasal congestion or discharge of excess mucus or purulent secretions, ear ache,   fever, chills, sweats, unintended wt loss or wt gain, classically pleuritic or exertional cp,  orthopnea pnd or arm/hand swelling  or leg swelling, presyncope, palpitations, abdominal pain, anorexia, nausea, vomiting, diarrhea  or change in bowel habits or change in bladder habits, change in stools or change in urine, dysuria, hematuria,  rash,  arthralgias, visual complaints, headache, numbness, weakness or ataxia or problems with walking or coordination,  change in mood or  memory.               Past Medical History:  Diagnosis Date  . Aortic stenosis   . Arthritis   . Atrial fibrillation (HCC)   . Coronary atherosclerosis of native coronary artery    Multivessel  . Essential hypertension   . Gout   . Hypothyroidism   . Mixed hyperlipidemia   . Psoriasis     Outpatient Medications Prior to Visit  Medication Sig Dispense Refill  . acetaminophen (TYLENOL) 650 MG CR tablet Take 1,300 mg by mouth daily.    Marland Kitchen allopurinol (ZYLOPRIM) 300 MG tablet Take 300 mg by mouth daily.  3  . apixaban (ELIQUIS) 5 MG TABS tablet Take 1 tablet (5 mg total) by mouth 2 (two) times daily. 60 tablet 0  . atorvastatin (LIPITOR) 10 MG tablet Take 10 mg by mouth daily.    Marland Kitchen docusate sodium (COLACE) 100 MG capsule Take 100 mg by mouth at bedtime.    . fenofibrate (TRICOR) 145 MG tablet Take 145 mg by mouth daily.    . ferrous sulfate 325 (65 FE) MG EC tablet Take 325 mg by mouth at bedtime.    . furosemide (LASIX) 40 MG tablet Take 80 mg by mouth 2 (two) times daily.    Marland Kitchen levothyroxine (SYNTHROID, LEVOTHROID) 125 MCG tablet Take 125 mcg by mouth daily before breakfast.    .  metoprolol succinate (TOPROL-XL) 50 MG 24 hr tablet Take 1 tablet (50 mg total) by mouth daily. Take with or immediately following a meal. 90 tablet 3  . Multiple Vitamins-Minerals (CENTRUM SILVER PO) Take 1 tablet by mouth at bedtime.    . pantoprazole (PROTONIX) 40 MG tablet Take 40 mg by mouth daily.  4  . potassium chloride SA (K-DUR,KLOR-CON) 20 MEQ tablet Take 20 mEq by mouth daily.    . Vitamin D, Ergocalciferol, (DRISDOL) 50000 UNITS CAPS Take 50,000 Units by mouth every Sunday.    . losartan (COZAAR) 25 MG tablet Take 1 tablet (25 mg total) by mouth daily. 90 tablet 3  . furosemide (LASIX) 40 MG tablet TAKE 80MG  IN THE MORNING AND 60 MG IN THE AFTERNOON (Patient  taking differently: Take 60 mg by mouth 2 (two) times daily.) 105 tablet 3   No facility-administered medications prior to visit.     Objective:     BP 136/64 (BP Location: Left Arm, Cuff Size: Large)   Pulse 60   Temp (!) 97.3 F (36.3 C) (Temporal)   Ht 5\' 3"  (1.6 m)   Wt 234 lb (106.1 kg)   LMP  (LMP Unknown)   SpO2 99% Comment: on 2lpm cont o2  BMI 41.45 kg/m   SpO2: 99 % (on 2lpm cont o2) O2 Type: Continuous O2 O2 Flow Rate (L/min): 2 L/min   Obese amb wf nad / using rolling walker  HEENT : pt wearing mask not removed for exam due to covid - 19 concerns.   NECK :  without JVD/Nodes/TM/ nl carotid upstrokes bilaterally   LUNGS: no acc muscle use,  Min barrel  contour chest wall with bilateral basilar crackles on inspiration    without cough on insp or exp maneuvers and min  Hyperresonant  to  percussion bilaterally     CV:  RRR  no s3  2/6 sem s increase in P2, and  R>L pitting Lower ext edema   ABD:  soft and nontender with pos end  insp Hoover's  in the supine position. No bruits or organomegaly appreciated, bowel sounds nl  MS:   slowgait/ uses rolling walker ext warm without deformities, calf tenderness, cyanosis or clubbing No obvious joint restrictions   SKIN: warm and dry without lesions    NEURO:  alert, approp, nl sensorium with  no motor or cerebellar deficits apparent.        I personally reviewed images and agree with radiology impression as follows:  CXR:   Portable  10/24/20 Interval development of mild cardiomegaly. No radiographic evidence of acute cardiopulmonary disease.     Assessment   COPD GOLD ?  Quit smoking 1991/ no pfts on record    When respiratory symptoms begin or become refractory well after a patient reports complete smoking cessation,  Especially when this wasn't the case while they were smoking, a red flag is raised based on the work of Dr which states:  if you quit smoking when your best day FEV1 is still well  preserved it is highly unlikely you will progress to severe disease.  That is to say, once the smoking stops,  the symptoms should not suddenly erupt or markedly worsen.  If so, the differential diagnosis should include  obesity/deconditioning,  LPR/Reflux/Aspiration syndromes,  occult CHF, or  especially side effect of medications commonly used in this population.   I don't think she has significant copd at this point but will arrange f/u in [place] with pfts and  for now just focus on her 02 needs which are more likely due to chf     Chronic respiratory failure with hypoxia and hypercapnia (HCC) HC03  10/24/20  = 38  -  11/02/2020 Patient Saturations on Room Air at Rest = 90% while Ambulating = 88% then req 5 Liters of pulsed oxygen while Ambulating = 91% but able to walk 250 ft with min sob   Most likely ohs, not copd, so target sats in low 90s and work on wt loss/ conditioning and return for pfts   >>> POC ordered/ reviewed goals specifically:  Make sure you check your oxygen saturation  at your highest level of activity  to be sure it stays over 90% and adjust  02 flow upward to maintain this level if needed but remember to turn it back to previous settings when you stop (to conserve your supply).     Bilateral leg edema Echo 06/28/20  1. Left ventricular ejection fraction, by estimation, is 60 to 65%. The  left ventricle has normal function. The left ventricle has no regional  wall motion abnormalities. Left ventricular diastolic parameters are  indeterminate.  2. Right ventricular systolic function is normal. The right ventricular  size is normal.  3. Left atrial size was moderately dilated.  4. Right atrial size was moderately dilated.  She does not appear to have significant cor pulmonale but may benefit from adequate sats 24/7 so see resp failure/ defer diuretic rx to cards/pcp.   Each maintenance medication was reviewed in detail including emphasizing most importantly  the difference between maintenance and prns and under what circumstances the prns are to be triggered using an action plan format where appropriate.  Total time for H and P, chart review, counseling,  directly observing portions of ambulatory 02 saturation study/ and generating customized AVS unique to this office visit / same day charting = 40 min                   [name], MD 11/02/2020

## 2020-11-02 NOTE — Telephone Encounter (Signed)
Will send notes in the morning.

## 2020-11-02 NOTE — Patient Instructions (Addendum)
Make sure you check your oxygen saturation  at your highest level of activity  to be sure it stays over 90% and adjust  02 flow upward to maintain this level if needed but remember to turn it back to previous settings when you stop (to conserve your supply).    Please schedule a follow up office visit in 6 weeks, call sooner if needed  -  office  >>> pfts on return.

## 2020-11-03 ENCOUNTER — Encounter: Payer: Self-pay | Admitting: Internal Medicine

## 2020-11-03 DIAGNOSIS — I5020 Unspecified systolic (congestive) heart failure: Secondary | ICD-10-CM | POA: Diagnosis not present

## 2020-11-03 DIAGNOSIS — R0602 Shortness of breath: Secondary | ICD-10-CM | POA: Diagnosis not present

## 2020-11-03 DIAGNOSIS — J449 Chronic obstructive pulmonary disease, unspecified: Secondary | ICD-10-CM | POA: Insufficient documentation

## 2020-11-03 DIAGNOSIS — J9612 Chronic respiratory failure with hypercapnia: Secondary | ICD-10-CM | POA: Insufficient documentation

## 2020-11-03 NOTE — Telephone Encounter (Signed)
O2 walk sats faxed to [facility].

## 2020-11-03 NOTE — Assessment & Plan Note (Addendum)
Echo 06/28/20  1. Left ventricular ejection fraction, by estimation, is 60 to 65%. The  left ventricle has normal function. The left ventricle has no regional  wall motion abnormalities. Left ventricular diastolic parameters are  indeterminate.  2. Right ventricular systolic function is normal. The right ventricular  size is normal.  3. Left atrial size was moderately dilated.  4. Right atrial size was moderately dilated.  She does not appear to have significant cor pulmonale but may benefit from adequate sats 24/7 so see resp failure/ defer diuretic rx to cards/pcp.   Each maintenance medication was reviewed in detail including emphasizing most importantly the difference between maintenance and prns and under what circumstances the prns are to be triggered using an action plan format where appropriate.  Total time for H and P, chart review, counseling,  directly observing portions of ambulatory 02 saturation study/ and generating customized AVS unique to this office visit / same day charting = 40 min

## 2020-11-03 NOTE — Assessment & Plan Note (Addendum)
HC03  10/24/20  = 38  -  11/02/2020 Patient Saturations on Room Air at Rest = 90% while Ambulating = 88% then req 5 Liters of pulsed oxygen while Ambulating = 91% but able to walk 250 ft with min sob   Most likely ohs, not copd, so target sats in low 90s and work on wt loss/ conditioning and return for pfts   >>> POC ordered/ reviewed goals specifically:  Make sure you check your oxygen saturation  at your highest level of activity  to be sure it stays over 90% and adjust  02 flow upward to maintain this level if needed but remember to turn it back to previous settings when you stop (to conserve your supply).

## 2020-11-03 NOTE — Assessment & Plan Note (Signed)
Quit smoking 1991/ no pfts on record    When respiratory symptoms begin or become refractory well after a patient reports complete smoking cessation,  Especially when this wasn't the case while they were smoking, a red flag is raised based on the work of Dr Primitivo Gauze which states:  if you quit smoking when your best day FEV1 is still well preserved it is highly unlikely you will progress to severe disease.  That is to say, once the smoking stops,  the symptoms should not suddenly erupt or markedly worsen.  If so, the differential diagnosis should include  obesity/deconditioning,  LPR/Reflux/Aspiration syndromes,  occult CHF, or  especially side effect of medications commonly used in this population.   I don't think she has significant copd at this point but will arrange f/u in [place] with pfts and for now just focus on her 02 needs which are more likely due to chf

## 2020-11-08 ENCOUNTER — Telehealth: Payer: Self-pay | Admitting: *Deleted

## 2020-11-08 DIAGNOSIS — J449 Chronic obstructive pulmonary disease, unspecified: Secondary | ICD-10-CM

## 2020-11-08 NOTE — Telephone Encounter (Signed)
-----   Message from Nyoka Cowden, MD sent at 11/07/2020  4:18 PM EDT ----- As long as she's doing ok that's fine - might check with her first  ----- Message ----- From: Christen Butter, CMA Sent: 11/07/2020   1:28 PM EDT To: Nyoka Cowden, MD  Her appt is set for 11/21/20- I doubt they will be able to do her PFT before then..would you like her to cancel this appt and reschedule for after next opening for PFT? ----- Message ----- From: Nyoka Cowden, MD Sent: 11/03/2020   5:59 AM EDT To: Christen Butter, CMA  Needs pfts on return to [facility] office

## 2020-11-08 NOTE — Telephone Encounter (Signed)
The appt for 11/21/20 was actually consult that was not previously cancelled  She is scheduled for 12/12/20  I told her we are ordering PFT to be done and that they should call her to schedule at [facility] but she was provided with their number in case they do not call her soon  Nothing further needed

## 2020-11-12 DIAGNOSIS — I5020 Unspecified systolic (congestive) heart failure: Secondary | ICD-10-CM | POA: Diagnosis not present

## 2020-11-12 DIAGNOSIS — R0602 Shortness of breath: Secondary | ICD-10-CM | POA: Diagnosis not present

## 2020-11-20 NOTE — Progress Notes (Signed)
Cardiology Office Note  Date: 11/21/2020   ID: [name], DOB 12/18/1940, MRN [id number]  PCP:  Ignatius Specking, MD  Cardiologist:  [name], MD Electrophysiologist:  None   Chief Complaint  Patient presents with  . Cardiac follow-up    History of Present Illness: [name] is a 80 y.o. female last seen in April.  She is here today for a follow-up visit.  She has been more mobile using walker, still difficulty with balance, and chronically short of breath.  She desaturates quickly if she does not use her supplemental oxygen.  No angina symptoms however, no palpitations.  She has gained weight in the last week.  She underwent successful cardioversion of persistent atrial fibrillation/flutter on April 29.  In early May she reported trouble with shortness of breath and hypoxia, was seen in the ER, and ultimately evaluated by Dr. Sherene Sires as an outpatient.  She has chronic hypoxic/hypercapnic respiratory failure, suspected OHS, potentially COPD with PFTs pending.  We went over her medications, she needs refills on Lasix which we plan to fill at 80 mg twice daily.  Past Medical History:  Diagnosis Date  . Aortic stenosis   . Arthritis   . Atrial fibrillation (HCC)   . Coronary atherosclerosis of native coronary artery    Multivessel  . Essential hypertension   . Gout   . Hypothyroidism   . Mixed hyperlipidemia   . Psoriasis     Past Surgical History:  Procedure Laterality Date  . AORTIC VALVE REPLACEMENT  2009   21 mm [facility] pericardial valve  . CARDIOVERSION N/A 10/20/2020   Procedure: CARDIOVERSION;  Surgeon: [name], MD;  Location: [facility];  Service: Cardiovascular;  Laterality: N/A;  . CHOLECYSTECTOMY     2011  . CORONARY ARTERY BYPASS GRAFT  2009   LIMA to LAD, SVG to RCA  . JOINT REPLACEMENT  2012   L TOTAL KNEE  . TOTAL KNEE ARTHROPLASTY  09/16/2011   Procedure: TOTAL KNEE ARTHROPLASTY;  Surgeon: Loanne Drilling, MD;  Location: [facility];   Service: Orthopedics;  Laterality: Right;  . TOTAL THYROIDECTOMY  1970    Current Outpatient Medications  Medication Sig Dispense Refill  . acetaminophen (TYLENOL) 650 MG CR tablet Take 1,300 mg by mouth daily.    Marland Kitchen allopurinol (ZYLOPRIM) 300 MG tablet Take 300 mg by mouth daily.  3  . apixaban (ELIQUIS) 5 MG TABS tablet Take 1 tablet (5 mg total) by mouth 2 (two) times daily. 60 tablet 0  . atorvastatin (LIPITOR) 10 MG tablet Take 10 mg by mouth daily.    Marland Kitchen docusate sodium (COLACE) 100 MG capsule Take 100 mg by mouth at bedtime.    . fenofibrate (TRICOR) 145 MG tablet Take 145 mg by mouth daily.    . ferrous sulfate 325 (65 FE) MG EC tablet Take 325 mg by mouth at bedtime.    . furosemide (LASIX) 40 MG tablet Take 80 mg by mouth 2 (two) times daily.    Marland Kitchen levothyroxine (SYNTHROID, LEVOTHROID) 125 MCG tablet Take 125 mcg by mouth daily before breakfast.    . metoprolol succinate (TOPROL-XL) 50 MG 24 hr tablet Take 1 tablet (50 mg total) by mouth daily. Take with or immediately following a meal. 90 tablet 3  . Multiple Vitamins-Minerals (CENTRUM SILVER PO) Take 1 tablet by mouth at bedtime.    . pantoprazole (PROTONIX) 40 MG tablet Take 40 mg by mouth daily.  4  . potassium  chloride SA (K-DUR,KLOR-CON) 20 MEQ tablet Take 20 mEq by mouth daily.    . Vitamin D, Ergocalciferol, (DRISDOL) 50000 UNITS CAPS Take 50,000 Units by mouth every Sunday.    . losartan (COZAAR) 25 MG tablet Take 1 tablet (25 mg total) by mouth daily. 90 tablet 3   No current facility-administered medications for this visit.   Allergies:  Statins and Penicillins   ROS: Hearing loss.  Physical Exam: VS:  BP 132/64   Pulse 61   Ht 5\' 3"  (1.6 m)   Wt 240 lb (108.9 kg)   LMP  (LMP Unknown)   SpO2 92%   BMI 42.51 kg/m , BMI Body mass index is 42.51 kg/m.  Wt Readings from Last 3 Encounters:  11/21/20 240 lb (108.9 kg)  11/02/20 234 lb (106.1 kg)  10/24/20 229 lb 15 oz (104.3 kg)    General: Elderly woman,  appears comfortable at rest.  Using a walker. HEENT: Conjunctiva and lids normal, using supplemental oxygen via nasal cannula. Neck: Supple, no elevated JVP or carotid bruits, no thyromegaly. Lungs: Diminished breath sounds, no wheezing. Cardiac: Regular rate and rhythm, no S3 or significant systolic murmur, no pericardial rub. Extremities: Chronic appearing leg edema.  ECG:  An ECG dated 10/24/2020 was personally reviewed today and demonstrated:  Sinus rhythm with right bundle branch block and PVC.  Recent Labwork: 10/24/2020: ALT 13; AST 23; B Natriuretic Peptide 276.0; BUN 43; Creatinine, Ser 1.66; Hemoglobin 12.9; Platelets 158; Potassium 4.0; Sodium 141   Other Studies Reviewed Today:  Echocardiogram 06/28/2020: 1. Left ventricular ejection fraction, by estimation, is 60 to 65%. The  left ventricle has normal function. The left ventricle has no regional  wall motion abnormalities. Left ventricular diastolic parameters are  indeterminate.  2. Right ventricular systolic function is normal. The right ventricular  size is normal.  3. Left atrial size was moderately dilated.  4. Right atrial size was moderately dilated.  5. The mitral valve is normal in structure. No evidence of mitral valve  regurgitation. No evidence of mitral stenosis.  6. 21 mm [facility] Magna pericardial valve is in the AV position. . The  aortic valve has been repaired/replaced. Aortic valve regurgitation is not  visualized. No aortic stenosis is present.   Assessment and Plan:  1.  Persistent atrial fibrillation/flutter with CHA2DS2-VASc score of 5.  She is status post successful cardioversion as noted above, no sense of palpitations.  Plan to continue Eliquis for stroke prophylaxis.  She stopped Toprol-XL as instructed previously and has tolerated this well, heart rate in sinus rhythm today is in the 60s.  2.  Chronic hypoxic respiratory failure, now following with Dr. 08/26/2020 and on supplemental oxygen.  Possible  OHS with PFTs pending.  3.  Status post CABG with low risk ischemic testing in 2019.  No active angina on medical therapy.  Continue Lipitor.  4.  Chronic diastolic heart failure, LVEF 60 to 65% and RV contraction normal.  Refill Lasix at 80 mg twice daily with potassium supplement.  5.  Aortic stenosis status post bioprosthetic AVR, prosthetic function stable as of January echocardiogram.  Medication Adjustments/Labs and Tests Ordered: Current medicines are reviewed at length with the patient today.  Concerns regarding medicines are outlined above.   Tests Ordered: No orders of the defined types were placed in this encounter.   Medication Changes: No orders of the defined types were placed in this encounter.   Disposition:  Follow up 6 weeks.  Signed, February, MD,  [facility] 11/21/2020 9:10 AM    [facility] at [facility] [address] [place], [place], [place] [address] Phone: [phone]; Fax: [phone]

## 2020-11-21 ENCOUNTER — Other Ambulatory Visit: Payer: Self-pay | Admitting: *Deleted

## 2020-11-21 ENCOUNTER — Encounter: Payer: Self-pay | Admitting: Cardiology

## 2020-11-21 ENCOUNTER — Ambulatory Visit (INDEPENDENT_AMBULATORY_CARE_PROVIDER_SITE_OTHER): Payer: Medicare PPO | Admitting: Cardiology

## 2020-11-21 ENCOUNTER — Other Ambulatory Visit: Payer: Self-pay

## 2020-11-21 ENCOUNTER — Institutional Professional Consult (permissible substitution): Payer: Medicare PPO | Admitting: Internal Medicine

## 2020-11-21 ENCOUNTER — Encounter (INDEPENDENT_AMBULATORY_CARE_PROVIDER_SITE_OTHER): Payer: Self-pay

## 2020-11-21 VITALS — BP 132/64 | HR 61 | Ht 63.0 in | Wt 240.0 lb

## 2020-11-21 DIAGNOSIS — I4819 Other persistent atrial fibrillation: Secondary | ICD-10-CM

## 2020-11-21 DIAGNOSIS — I25119 Atherosclerotic heart disease of native coronary artery with unspecified angina pectoris: Secondary | ICD-10-CM

## 2020-11-21 DIAGNOSIS — I5032 Chronic diastolic (congestive) heart failure: Secondary | ICD-10-CM | POA: Diagnosis not present

## 2020-11-21 DIAGNOSIS — Z953 Presence of xenogenic heart valve: Secondary | ICD-10-CM | POA: Diagnosis not present

## 2020-11-21 MED ORDER — FUROSEMIDE 40 MG PO TABS: 80.0000 mg | ORAL_TABLET | Freq: Two times a day (BID) | ORAL | 6 refills | Status: DC

## 2020-11-21 NOTE — Patient Instructions (Signed)
Your physician recommends that you schedule a follow-up appointment in: 6 WEEKS WITH DR MCDOWELL  Your physician recommends that you continue on your current medications as directed. Please refer to the Current Medication list given to you today.  Thank you for choosing [facility]!!    

## 2020-12-04 DIAGNOSIS — R0602 Shortness of breath: Secondary | ICD-10-CM | POA: Diagnosis not present

## 2020-12-04 DIAGNOSIS — I5020 Unspecified systolic (congestive) heart failure: Secondary | ICD-10-CM | POA: Diagnosis not present

## 2020-12-05 DIAGNOSIS — Z0000 Encounter for general adult medical examination without abnormal findings: Secondary | ICD-10-CM | POA: Diagnosis not present

## 2020-12-05 DIAGNOSIS — I10 Essential (primary) hypertension: Secondary | ICD-10-CM | POA: Diagnosis not present

## 2020-12-05 DIAGNOSIS — Z6841 Body Mass Index (BMI) 40.0 and over, adult: Secondary | ICD-10-CM | POA: Diagnosis not present

## 2020-12-05 DIAGNOSIS — E7800 Pure hypercholesterolemia, unspecified: Secondary | ICD-10-CM | POA: Diagnosis not present

## 2020-12-05 DIAGNOSIS — Z1331 Encounter for screening for depression: Secondary | ICD-10-CM | POA: Diagnosis not present

## 2020-12-05 DIAGNOSIS — Z79899 Other long term (current) drug therapy: Secondary | ICD-10-CM | POA: Diagnosis not present

## 2020-12-05 DIAGNOSIS — Z299 Encounter for prophylactic measures, unspecified: Secondary | ICD-10-CM | POA: Diagnosis not present

## 2020-12-05 DIAGNOSIS — Z1339 Encounter for screening examination for other mental health and behavioral disorders: Secondary | ICD-10-CM | POA: Diagnosis not present

## 2020-12-05 DIAGNOSIS — R5383 Other fatigue: Secondary | ICD-10-CM | POA: Diagnosis not present

## 2020-12-05 DIAGNOSIS — Z789 Other specified health status: Secondary | ICD-10-CM | POA: Diagnosis not present

## 2020-12-05 DIAGNOSIS — E039 Hypothyroidism, unspecified: Secondary | ICD-10-CM | POA: Diagnosis not present

## 2020-12-05 DIAGNOSIS — Z7189 Other specified counseling: Secondary | ICD-10-CM | POA: Diagnosis not present

## 2020-12-12 ENCOUNTER — Ambulatory Visit: Payer: Medicare PPO | Admitting: Internal Medicine

## 2020-12-13 DIAGNOSIS — I5020 Unspecified systolic (congestive) heart failure: Secondary | ICD-10-CM | POA: Diagnosis not present

## 2020-12-13 DIAGNOSIS — R0602 Shortness of breath: Secondary | ICD-10-CM | POA: Diagnosis not present

## 2021-01-01 ENCOUNTER — Other Ambulatory Visit (HOSPITAL_COMMUNITY)
Admission: RE | Admit: 2021-01-01 | Discharge: 2021-01-01 | Disposition: A | Discharge status: Home / Self Care | Payer: Medicare PPO | Source: Ambulatory Visit | Attending: Internal Medicine | Admitting: Internal Medicine

## 2021-01-01 ENCOUNTER — Other Ambulatory Visit: Payer: Self-pay

## 2021-01-01 DIAGNOSIS — Z20822 Contact with and (suspected) exposure to covid-19: Secondary | ICD-10-CM | POA: Insufficient documentation

## 2021-01-01 DIAGNOSIS — Z01812 Encounter for preprocedural laboratory examination: Secondary | ICD-10-CM | POA: Diagnosis not present

## 2021-01-02 ENCOUNTER — Encounter: Payer: Self-pay | Admitting: *Deleted

## 2021-01-02 ENCOUNTER — Ambulatory Visit (HOSPITAL_COMMUNITY)
Admission: RE | Admit: 2021-01-02 | Discharge: 2021-01-02 | Disposition: A | Discharge status: Home / Self Care | Payer: Medicare PPO | Source: Ambulatory Visit | Attending: Internal Medicine | Admitting: Internal Medicine

## 2021-01-02 DIAGNOSIS — J449 Chronic obstructive pulmonary disease, unspecified: Secondary | ICD-10-CM

## 2021-01-02 LAB — PULMONARY FUNCTION TEST
DL/VA % pred: 77 %
DL/VA: 3.2 ml/min/mmHg/L
DLCO unc % pred: 43 %
DLCO unc: 7.87 ml/min/mmHg
FEF 25-75 Post: 2.46 L/sec
FEF 25-75 Pre: 1.15 L/sec
FEF2575-%Change-Post: 113 %
FEF2575-%Pred-Post: 175 %
FEF2575-%Pred-Pre: 82 %
FEV1-%Change-Post: 19 %
FEV1-%Pred-Post: 74 %
FEV1-%Pred-Pre: 62 %
FEV1-Post: 1.4 L
FEV1-Pre: 1.17 L
FEV1FVC-%Change-Post: -2 %
FEV1FVC-%Pred-Pre: 111 %
FEV6-%Change-Post: 22 %
FEV6-%Pred-Post: 72 %
FEV6-%Pred-Pre: 59 %
FEV6-Post: 1.74 L
FEV6-Pre: 1.42 L
FEV6FVC-%Pred-Post: 105 %
FEV6FVC-%Pred-Pre: 105 %
FVC-%Change-Post: 22 %
FVC-%Pred-Post: 68 %
FVC-%Pred-Pre: 55 %
FVC-Post: 1.74 L
FVC-Pre: 1.42 L
Post FEV1/FVC ratio: 80 %
Post FEV6/FVC ratio: 100 %
Pre FEV1/FVC ratio: 83 %
Pre FEV6/FVC Ratio: 100 %
RV % pred: 110 %
RV: 2.56 L
TLC % pred: 93 %
TLC: 4.56 L

## 2021-01-02 LAB — SARS CORONAVIRUS 2 (TAT 6-24 HRS): SARS Coronavirus 2: NEGATIVE

## 2021-01-02 MED ORDER — ALBUTEROL SULFATE (2.5 MG/3ML) 0.083% IN NEBU
2.5000 mg | INHALATION_SOLUTION | Freq: Once | RESPIRATORY_TRACT | Status: AC
  Administered 2021-01-02: 2.5 mg via RESPIRATORY_TRACT

## 2021-01-03 ENCOUNTER — Other Ambulatory Visit: Payer: Self-pay

## 2021-01-03 ENCOUNTER — Encounter: Payer: Self-pay | Admitting: Internal Medicine

## 2021-01-03 ENCOUNTER — Ambulatory Visit: Payer: Medicare PPO | Admitting: Internal Medicine

## 2021-01-03 DIAGNOSIS — J449 Chronic obstructive pulmonary disease, unspecified: Secondary | ICD-10-CM

## 2021-01-03 DIAGNOSIS — R0602 Shortness of breath: Secondary | ICD-10-CM | POA: Diagnosis not present

## 2021-01-03 DIAGNOSIS — R600 Localized edema: Secondary | ICD-10-CM | POA: Diagnosis not present

## 2021-01-03 DIAGNOSIS — I5020 Unspecified systolic (congestive) heart failure: Secondary | ICD-10-CM | POA: Diagnosis not present

## 2021-01-03 DIAGNOSIS — J9612 Chronic respiratory failure with hypercapnia: Secondary | ICD-10-CM | POA: Diagnosis not present

## 2021-01-03 DIAGNOSIS — J9611 Chronic respiratory failure with hypoxia: Secondary | ICD-10-CM

## 2021-01-03 NOTE — Progress Notes (Signed)
[name], female    DOB: 07/06/1940,   MRN: [id number]   Brief patient profile:  57 yowf  Quit smoking 1991 @  220 and did fine breathing including cabg/AVR 2009 until 2015 With afib onset of sob assoc with doe ever since and worse since early 2022 so referred to pulmonary clinic 11/02/2020 by Dr  Sherril Croon.     History of Present Illness  11/02/2020  Pulmonary/ 1st office eval/[name]  Chief Complaint  Patient presents with   Pulmonary Consult    Referred by Dr Sherril Croon. Pt wanting POC. Has been on o2 24/7 for the past 3 wks.   Dyspnea:  75 ft / rides cart at grocery store x 6 months  Cough: no  Sleep: in recliner x 30 degrees x years  SABA use: none  Rec Make sure you check your oxygen saturation  at your highest level of activity  to be sure it stays over 90% and adjust  02 flow upward to maintain this level if needed but remember to turn it back to previous settings when you stop (to conserve your supply).  Please schedule a follow up office visit in 6 weeks, call sooner if needed  - [place] office  >>> pfts on return.   01/03/2021  f/u ov/[place] office/[place] [facility] re: new hypercarbic / hypoxemic RF/ MO/ diastolic dysfunction with MOD LAE on echo s/p AVR Chief Complaint  Patient presents with   Follow-up    Breathing is unchanged. No new co's.    Dyspnea:  walking 75 ft at home/ pushes cart  Cough: none  Sleeping: in chair x years x 45 degrees recliner  SABA use: none  02: 2lpm 24/7 not adjusting as rec  Covid status: vax x 3 , never infected  Lung cancer screening: n/a    No obvious day to day or daytime variability or assoc excess/ purulent sputum or mucus plugs or hemoptysis or cp or chest tightness, subjective wheeze or overt sinus or hb symptoms.   Sleeping  without nocturnal  or early am exacerbation  of respiratory  c/o's or need for noct saba. Also denies any obvious fluctuation of symptoms with weather or environmental changes or other aggravating or alleviating factors  except as outlined above   No unusual exposure hx or h/o childhood pna/ asthma or knowledge of premature birth.  Current Allergies, Complete Past Medical History, Past Surgical History, Family History, and Social History were reviewed in [employer] record.  ROS  The following are not active complaints unless bolded Hoarseness, sore throat, dysphagia, dental problems, itching, sneezing,  nasal congestion or discharge of excess mucus or purulent secretions, ear ache,   fever, chills, sweats, unintended wt loss or wt gain, classically pleuritic or exertional cp,  orthopnea pnd or arm/hand swelling  or leg swelling, presyncope, palpitations, abdominal pain, anorexia, nausea, vomiting, diarrhea  or change in bowel habits or change in bladder habits, change in stools or change in urine, dysuria, hematuria,  rash, arthralgias, visual complaints, headache, numbness, weakness or ataxia or problems with walking or coordination,  change in mood or  memory.        Current Meds  Medication Sig   acetaminophen (TYLENOL) 650 MG CR tablet Take 1,300 mg by mouth daily.   allopurinol (ZYLOPRIM) 300 MG tablet Take 300 mg by mouth daily.   apixaban (ELIQUIS) 5 MG TABS tablet Take 1 tablet (5 mg total) by mouth 2 (two) times daily.   atorvastatin (LIPITOR) 10 MG tablet  Take 10 mg by mouth in the morning and at bedtime.   docusate sodium (COLACE) 100 MG capsule Take 100 mg by mouth at bedtime.   fenofibrate (TRICOR) 145 MG tablet Take 145 mg by mouth daily.   ferrous sulfate 325 (65 FE) MG EC tablet Take 325 mg by mouth at bedtime.   furosemide (LASIX) 40 MG tablet Take 2 tablets (80 mg total) by mouth 2 (two) times daily.   levothyroxine (SYNTHROID, LEVOTHROID) 125 MCG tablet Take 125 mcg by mouth daily before breakfast.   metoprolol succinate (TOPROL-XL) 50 MG 24 hr tablet Take 1 tablet (50 mg total) by mouth daily. Take with or immediately following a meal.   Multiple Vitamins-Minerals  (CENTRUM SILVER PO) Take 1 tablet by mouth at bedtime.   pantoprazole (PROTONIX) 40 MG tablet Take 40 mg by mouth daily.   potassium chloride SA (K-DUR,KLOR-CON) 20 MEQ tablet Take 20 mEq by mouth daily.   Vitamin D, Ergocalciferol, (DRISDOL) 50000 UNITS CAPS Take 50,000 Units by mouth every Sunday.                     Past Medical History:  Diagnosis Date   Aortic stenosis    Arthritis    Atrial fibrillation (HCC)    Coronary atherosclerosis of native coronary artery    Multivessel   Essential hypertension    Gout    Hypothyroidism    Mixed hyperlipidemia    Psoriasis         Objective:     Wt Readings from Last 3 Encounters:  11/21/20 240 lb (108.9 kg)  11/02/20 234 lb (106.1 kg)  10/24/20 229 lb 15 oz (104.3 kg)      Vital signs reviewed  01/03/2021  - Note at rest 02 sats  91% on 2lpm    General appearance:    obese wf nad      HEENT : pt wearing mask not removed for exam due to covid -19 concerns.    NECK :  without JVD/Nodes/TM/ nl carotid upstrokes bilaterally   LUNGS: no acc muscle use,  Nl contour chest which is clear to A and P bilaterally without cough on insp or exp maneuvers   CV:  RRR  no S3, 2/6 sem s   increase in P2, and  R> L pitting both LE's  ABD:  obese soft and nontender with nl inspiratory excursion in the supine position. No bruits or organomegaly appreciated, bowel sounds nl  MS:  Nl gait/ ext warm without deformities, calf tenderness, cyanosis or clubbing No obvious joint restrictions   SKIN: warm and dry without lesions    NEURO:  alert, approp, nl sensorium with  no motor or cerebellar deficits apparent.                 Assessment                              [name], MD 11/02/2020

## 2021-01-03 NOTE — Patient Instructions (Addendum)
Make sure you check your oxygen saturation  at your highest level of activity  to be sure it stays over 90% and adjust  02 flow upward to maintain this level if needed but remember to turn it back to previous settings when you stop (to conserve your supply).   Your main problem with your lung function relates to your weight  - your TSH needs to be checked if not done recently   We will call to schedule you a high resolution CT.   Please schedule a follow up visit in 3 months but call sooner if needed      f

## 2021-01-04 ENCOUNTER — Encounter: Payer: Self-pay | Admitting: Internal Medicine

## 2021-01-04 NOTE — Assessment & Plan Note (Signed)
Quit smoking 1991 - PFT's  01/02/2021  FEV1 1.40 (74 % ) ratio 0.80  p 19 % improvement from saba p 0 prior to study with DLCO  18.22 (43%) corrects to 4.14 (77%)  for alv volume and FV curve min concavity with ERV 25% at wt 238   She has no significant copd at this point and not likely enough asthma to benefit from bronchodilators

## 2021-01-04 NOTE — Assessment & Plan Note (Signed)
Echo 06/28/20  1. Left ventricular ejection fraction, by estimation, is 60 to 65%. The  left ventricle has normal function. The left ventricle has no regional  wall motion abnormalities. Left ventricular diastolic parameters are  indeterminate.  2. Right ventricular systolic function is normal. The right ventricular  size is normal.  3. Left atrial size was moderately dilated.  4. Right atrial size was moderately dilated.  This explains her tendency to edema and may explain a component of her doe as well > defer diuretic rx to PCP/ cards

## 2021-01-04 NOTE — Assessment & Plan Note (Addendum)
ERV 28% @ wt 238  01/02/21 PFTs c/w effects of obesity  Body mass index is 41.98 kg/m.  -  Trending up  Lab Results  Component Value Date   TSH 3.730  12/09/2007     Contributing to gerd risk/ doe/reviewed the need and the process to achieve and maintain neg calorie balance > defer f/u primary care including intermittently monitoring thyroid status            Each maintenance medication was reviewed in detail including emphasizing most importantly the difference between maintenance and prns and under what circumstances the prns are to be triggered using an action plan format where appropriate.  Total time for H and P, chart review, counseling, reviewing approp use of 02 device(s) and generating customized AVS unique to this office visit / same day charting > 30 min

## 2021-01-04 NOTE — Assessment & Plan Note (Addendum)
HC03  10/24/20  = 38  -  11/02/2020 Patient Saturations on Room Air at Rest = 90% while Ambulating = 88% then req 5 Liters of pulsed oxygen while Ambulating = 91% but able to walk 250 ft with min sob  - PFT's  01/02/2021  FEV1 1.40 (74 % ) ratio 0.80  p 19 % improvement from saba p 0 prior to study with DLCO  18.22 (43%) corrects to 4.14 (77%)  for alv volume and FV curve min concavity with ERV 25% at wt 238    Pattern is restrictive with minimal reversible airflow obstruction and unexplained degree of 02 dependency so needs HRCT to complete the w/u and in the meantime rec :  Make sure you check your oxygen saturation  at your highest level of activity  to be sure it stays over 90% and adjust  02 flow upward to maintain this level if needed but remember to turn it back to previous settings when you stop (to conserve your supply).

## 2021-01-12 DIAGNOSIS — I5020 Unspecified systolic (congestive) heart failure: Secondary | ICD-10-CM | POA: Diagnosis not present

## 2021-01-12 DIAGNOSIS — R0602 Shortness of breath: Secondary | ICD-10-CM | POA: Diagnosis not present

## 2021-01-15 ENCOUNTER — Ambulatory Visit: Payer: Medicare PPO | Admitting: Cardiology

## 2021-01-15 ENCOUNTER — Encounter: Payer: Self-pay | Admitting: Cardiology

## 2021-01-15 ENCOUNTER — Other Ambulatory Visit: Payer: Self-pay

## 2021-01-15 VITALS — BP 120/60 | HR 58 | Ht 63.0 in | Wt 236.0 lb

## 2021-01-15 DIAGNOSIS — I4819 Other persistent atrial fibrillation: Secondary | ICD-10-CM | POA: Diagnosis not present

## 2021-01-15 DIAGNOSIS — I5032 Chronic diastolic (congestive) heart failure: Secondary | ICD-10-CM

## 2021-01-15 DIAGNOSIS — I25119 Atherosclerotic heart disease of native coronary artery with unspecified angina pectoris: Secondary | ICD-10-CM

## 2021-01-15 DIAGNOSIS — Z953 Presence of xenogenic heart valve: Secondary | ICD-10-CM

## 2021-01-15 NOTE — Progress Notes (Signed)
Cardiology Office Note  Date: 01/15/2021   ID: [name], DOB [birth year], MRN [id number]  PCP:  Ignatius Specking, MD  Cardiologist:  [name], MD Electrophysiologist:  None   Chief Complaint  Patient presents with   Cardiac follow-up    History of Present Illness: [name] is a 80 y.o. female last seen in May.  She is here for a follow-up visit.  She does not report any angina at this point, no palpitations, noted to be in sinus rhythm by ECG today.  She continues to follow with Dr. Sherene Sires, I reviewed the recent note.  She is scheduled for a high-resolution chest CT for further assessment, continues on supplemental oxygen for management of chronic hypoxic respiratory failure.  I reviewed her current medications as noted below.  She does not report any spontaneous bleeding problems on Eliquis, hemoglobin normal in May.  Past Medical History:  Diagnosis Date   Aortic stenosis    Arthritis    Atrial fibrillation (HCC)    Coronary atherosclerosis of native coronary artery    Multivessel   Essential hypertension    Gout    Hypothyroidism    Mixed hyperlipidemia    Psoriasis     Past Surgical History:  Procedure Laterality Date   AORTIC VALVE REPLACEMENT  2009   21 mm Edwards Magna pericardial valve   CARDIOVERSION N/A 10/20/2020   Procedure: CARDIOVERSION;  Surgeon: [name], MD;  Location: [facility];  Service: Cardiovascular;  Laterality: N/A;   CHOLECYSTECTOMY     2011   CORONARY ARTERY BYPASS GRAFT  2009   LIMA to LAD, SVG to RCA   JOINT REPLACEMENT  2012   L TOTAL KNEE   TOTAL KNEE ARTHROPLASTY  09/16/2011   Procedure: TOTAL KNEE ARTHROPLASTY;  Surgeon: Loanne Drilling, MD;  Location: [facility];  Service: Orthopedics;  Laterality: Right;   TOTAL THYROIDECTOMY  1970    Current Outpatient Medications  Medication Sig Dispense Refill   acetaminophen (TYLENOL) 650 MG CR tablet Take 1,300 mg by mouth daily.     allopurinol (ZYLOPRIM) 300 MG tablet Take  300 mg by mouth daily.  3   apixaban (ELIQUIS) 5 MG TABS tablet Take 1 tablet (5 mg total) by mouth 2 (two) times daily. 60 tablet 0   atorvastatin (LIPITOR) 10 MG tablet Take 20 mg by mouth in the morning and at bedtime.     docusate sodium (COLACE) 100 MG capsule Take 100 mg by mouth at bedtime.     fenofibrate (TRICOR) 145 MG tablet Take 145 mg by mouth daily.     ferrous sulfate 325 (65 FE) MG EC tablet Take 325 mg by mouth at bedtime.     furosemide (LASIX) 40 MG tablet Take 2 tablets (80 mg total) by mouth 2 (two) times daily. 120 tablet 6   levothyroxine (SYNTHROID, LEVOTHROID) 125 MCG tablet Take 125 mcg by mouth daily before breakfast.     losartan (COZAAR) 25 MG tablet Take 1 tablet (25 mg total) by mouth daily. 90 tablet 3   metoprolol succinate (TOPROL-XL) 50 MG 24 hr tablet Take 1 tablet (50 mg total) by mouth daily. Take with or immediately following a meal. 90 tablet 3   Multiple Vitamins-Minerals (CENTRUM SILVER PO) Take 1 tablet by mouth at bedtime.     pantoprazole (PROTONIX) 40 MG tablet Take 40 mg by mouth daily.  4   potassium chloride SA (K-DUR,KLOR-CON) 20 MEQ tablet Take 20 mEq by mouth  daily.     Vitamin D, Ergocalciferol, (DRISDOL) 50000 UNITS CAPS Take 50,000 Units by mouth every Sunday.     No current facility-administered medications for this visit.   Allergies:  Statins and Penicillins   ROS: No dizziness or syncope.  Physical Exam: VS:  BP 120/60   Pulse (!) 58   Ht 5\' 3"  (1.6 m)   Wt 236 lb (107 kg)   LMP  (LMP Unknown)   SpO2 93%   BMI 41.81 kg/m , BMI Body mass index is 41.81 kg/m.  Wt Readings from Last 3 Encounters:  01/15/21 236 lb (107 kg)  01/03/21 237 lb (107.5 kg)  11/21/20 240 lb (108.9 kg)    General: Elderly woman, wearing oxygen via nasal cannula.11/23/20 HEENT: Conjunctiva and lids normal, wearing a mask. Neck: Supple, no elevated JVP or carotid bruits, no thyromegaly. Lungs: No wheezing, nonlabored breathing at rest. Cardiac: Regular  rate and rhythm, no S3 or significant systolic murmur, no pericardial rub. Extremities: Stable, chronic appearing leg edema.  ECG:  An ECG dated 10/24/2020 was personally reviewed today and demonstrated:  Sinus rhythm with right bundle branch block and PVC  Recent Labwork: 10/24/2020: ALT 13; AST 23; B Natriuretic Peptide 276.0; BUN 43; Creatinine, Ser 1.66; Hemoglobin 12.9; Platelets 158; Potassium 4.0; Sodium 141   Other Studies Reviewed Today:  Echocardiogram 06/28/2020:  1. Left ventricular ejection fraction, by estimation, is 60 to 65%. The  left ventricle has normal function. The left ventricle has no regional  wall motion abnormalities. Left ventricular diastolic parameters are  indeterminate.   2. Right ventricular systolic function is normal. The right ventricular  size is normal.   3. Left atrial size was moderately dilated.   4. Right atrial size was moderately dilated.   5. The mitral valve is normal in structure. No evidence of mitral valve  regurgitation. No evidence of mitral stenosis.   6. 21 mm Edwards Magna pericardial valve is in the AV position. . The  aortic valve has been repaired/replaced. Aortic valve regurgitation is not  visualized. No aortic stenosis is present.  Assessment and Plan:  1.  Persistent atrial fibrillation/flutter with CHA2DS2-VASc score of 5.  She is maintaining sinus rhythm after last cardioversion and continues on Eliquis for stroke prophylaxis.  2.  CAD status post CABG with no active angina and low risk ischemic testing in 2019.  LVEF 60 to 65% by most recent echocardiogram.  Continue Lipitor.  3.  Aortic stenosis status post bioprosthetic AVR, 21 mm Edwards Magna pericardial prosthesis stable by recent echocardiogram.  4.  Chronic diastolic heart failure, weight is down 4 pounds from May, she continues on Lasix with potassium supplement.  Medication Adjustments/Labs and Tests Ordered: Current medicines are reviewed at length with the patient  today.  Concerns regarding medicines are outlined above.   Tests Ordered: Orders Placed This Encounter  Procedures   EKG 12-Lead     Medication Changes: No orders of the defined types were placed in this encounter.   Disposition:  Follow up  3 months.  Signed, June, MD, [facility] 01/15/2021 2:48 PM    [facility] [address] [place], [place], [place] Phone: [phone]; Fax: [phone]

## 2021-01-15 NOTE — Patient Instructions (Addendum)
Medication Instructions:   Your physician recommends that you continue on your current medications as directed. Please refer to the Current Medication list given to you today.  Labwork:  none  Testing/Procedures:  none  Follow-Up:  Your physician recommends that you schedule a follow-up appointment in: 3 months.  Any Other Special Instructions Will Be Listed Below (If Applicable).  If you need a refill on your cardiac medications before your next appointment, please call your pharmacy. 

## 2021-01-25 ENCOUNTER — Ambulatory Visit (HOSPITAL_COMMUNITY)
Admission: RE | Admit: 2021-01-25 | Discharge: 2021-01-25 | Disposition: A | Discharge status: Home / Self Care | Payer: Medicare PPO | Source: Ambulatory Visit | Attending: Internal Medicine | Admitting: Internal Medicine

## 2021-01-25 ENCOUNTER — Other Ambulatory Visit: Payer: Self-pay

## 2021-01-25 DIAGNOSIS — I700 Atherosclerosis of aorta: Secondary | ICD-10-CM | POA: Diagnosis not present

## 2021-01-25 DIAGNOSIS — J9611 Chronic respiratory failure with hypoxia: Secondary | ICD-10-CM | POA: Insufficient documentation

## 2021-01-25 DIAGNOSIS — J9612 Chronic respiratory failure with hypercapnia: Secondary | ICD-10-CM | POA: Diagnosis not present

## 2021-01-29 ENCOUNTER — Encounter: Payer: Self-pay | Admitting: *Deleted

## 2021-02-03 DIAGNOSIS — R0602 Shortness of breath: Secondary | ICD-10-CM | POA: Diagnosis not present

## 2021-02-03 DIAGNOSIS — I5020 Unspecified systolic (congestive) heart failure: Secondary | ICD-10-CM | POA: Diagnosis not present

## 2021-02-05 ENCOUNTER — Other Ambulatory Visit: Payer: Self-pay | Admitting: Family Medicine

## 2021-02-05 NOTE — Telephone Encounter (Signed)
Prescription refill request for Eliquis received. Indication: Atrial fib/flutter Last office visit: 01/15/21  Ival Bible MD Scr: 1.66 on 10/24/20 Age: 80 Weight: 107kg  Based on above findings Eliquis 5mg  twice daily is the appropriate dose.  Refill approved.

## 2021-02-12 DIAGNOSIS — I5020 Unspecified systolic (congestive) heart failure: Secondary | ICD-10-CM | POA: Diagnosis not present

## 2021-02-12 DIAGNOSIS — R0602 Shortness of breath: Secondary | ICD-10-CM | POA: Diagnosis not present

## 2021-02-27 DIAGNOSIS — R32 Unspecified urinary incontinence: Secondary | ICD-10-CM | POA: Diagnosis not present

## 2021-02-27 DIAGNOSIS — D6869 Other thrombophilia: Secondary | ICD-10-CM | POA: Diagnosis not present

## 2021-02-27 DIAGNOSIS — I110 Hypertensive heart disease with heart failure: Secondary | ICD-10-CM | POA: Diagnosis not present

## 2021-02-27 DIAGNOSIS — Z6841 Body Mass Index (BMI) 40.0 and over, adult: Secondary | ICD-10-CM | POA: Diagnosis not present

## 2021-02-27 DIAGNOSIS — I739 Peripheral vascular disease, unspecified: Secondary | ICD-10-CM | POA: Diagnosis not present

## 2021-02-27 DIAGNOSIS — Z7409 Other reduced mobility: Secondary | ICD-10-CM | POA: Diagnosis not present

## 2021-02-27 DIAGNOSIS — M1990 Unspecified osteoarthritis, unspecified site: Secondary | ICD-10-CM | POA: Diagnosis not present

## 2021-02-27 DIAGNOSIS — K5900 Constipation, unspecified: Secondary | ICD-10-CM | POA: Diagnosis not present

## 2021-02-27 DIAGNOSIS — I4891 Unspecified atrial fibrillation: Secondary | ICD-10-CM | POA: Diagnosis not present

## 2021-02-27 DIAGNOSIS — K08109 Complete loss of teeth, unspecified cause, unspecified class: Secondary | ICD-10-CM | POA: Diagnosis not present

## 2021-02-27 DIAGNOSIS — E039 Hypothyroidism, unspecified: Secondary | ICD-10-CM | POA: Diagnosis not present

## 2021-02-27 DIAGNOSIS — I509 Heart failure, unspecified: Secondary | ICD-10-CM | POA: Diagnosis not present

## 2021-02-27 DIAGNOSIS — Z7901 Long term (current) use of anticoagulants: Secondary | ICD-10-CM | POA: Diagnosis not present

## 2021-02-27 DIAGNOSIS — I252 Old myocardial infarction: Secondary | ICD-10-CM | POA: Diagnosis not present

## 2021-02-27 DIAGNOSIS — E261 Secondary hyperaldosteronism: Secondary | ICD-10-CM | POA: Diagnosis not present

## 2021-02-27 DIAGNOSIS — K219 Gastro-esophageal reflux disease without esophagitis: Secondary | ICD-10-CM | POA: Diagnosis not present

## 2021-02-27 DIAGNOSIS — M109 Gout, unspecified: Secondary | ICD-10-CM | POA: Diagnosis not present

## 2021-03-06 DIAGNOSIS — I5020 Unspecified systolic (congestive) heart failure: Secondary | ICD-10-CM | POA: Diagnosis not present

## 2021-03-06 DIAGNOSIS — R0602 Shortness of breath: Secondary | ICD-10-CM | POA: Diagnosis not present

## 2021-03-09 DIAGNOSIS — I5032 Chronic diastolic (congestive) heart failure: Secondary | ICD-10-CM | POA: Diagnosis not present

## 2021-03-09 DIAGNOSIS — I10 Essential (primary) hypertension: Secondary | ICD-10-CM | POA: Diagnosis not present

## 2021-03-09 DIAGNOSIS — R001 Bradycardia, unspecified: Secondary | ICD-10-CM | POA: Diagnosis not present

## 2021-03-09 DIAGNOSIS — Z299 Encounter for prophylactic measures, unspecified: Secondary | ICD-10-CM | POA: Diagnosis not present

## 2021-03-09 DIAGNOSIS — Z23 Encounter for immunization: Secondary | ICD-10-CM | POA: Diagnosis not present

## 2021-03-15 DIAGNOSIS — R0602 Shortness of breath: Secondary | ICD-10-CM | POA: Diagnosis not present

## 2021-03-15 DIAGNOSIS — I5020 Unspecified systolic (congestive) heart failure: Secondary | ICD-10-CM | POA: Diagnosis not present

## 2021-03-26 DIAGNOSIS — Z299 Encounter for prophylactic measures, unspecified: Secondary | ICD-10-CM | POA: Diagnosis not present

## 2021-03-26 DIAGNOSIS — I4891 Unspecified atrial fibrillation: Secondary | ICD-10-CM | POA: Diagnosis not present

## 2021-03-26 DIAGNOSIS — N1832 Chronic kidney disease, stage 3b: Secondary | ICD-10-CM | POA: Diagnosis not present

## 2021-03-26 DIAGNOSIS — I5032 Chronic diastolic (congestive) heart failure: Secondary | ICD-10-CM | POA: Diagnosis not present

## 2021-03-26 DIAGNOSIS — I25119 Atherosclerotic heart disease of native coronary artery with unspecified angina pectoris: Secondary | ICD-10-CM | POA: Diagnosis not present

## 2021-04-02 ENCOUNTER — Ambulatory Visit: Payer: Medicare PPO | Admitting: Internal Medicine

## 2021-04-02 ENCOUNTER — Other Ambulatory Visit: Payer: Self-pay

## 2021-04-02 ENCOUNTER — Encounter: Payer: Self-pay | Admitting: Internal Medicine

## 2021-04-02 DIAGNOSIS — J9611 Chronic respiratory failure with hypoxia: Secondary | ICD-10-CM

## 2021-04-02 DIAGNOSIS — J449 Chronic obstructive pulmonary disease, unspecified: Secondary | ICD-10-CM | POA: Diagnosis not present

## 2021-04-02 DIAGNOSIS — J9612 Chronic respiratory failure with hypercapnia: Secondary | ICD-10-CM

## 2021-04-02 MED ORDER — FLUTICASONE FUROATE-VILANTEROL 100-25 MCG/INH IN AEPB: 1.0000 | INHALATION_SPRAY | Freq: Every day | RESPIRATORY_TRACT | 0 refills | Status: DC

## 2021-04-02 MED ORDER — FLUTICASONE FUROATE-VILANTEROL 100-25 MCG/INH IN AEPB: 1.0000 | INHALATION_SPRAY | Freq: Every morning | RESPIRATORY_TRACT | 11 refills | Status: DC

## 2021-04-02 NOTE — Patient Instructions (Signed)
We will walk you today to help you understand how much 0xygen you need walking vs sitting  Make sure you check your oxygen saturation  at your highest level of activity  to be sure it stays over 90% and adjust  02 flow upward to maintain this level if needed but remember to turn it back to previous settings when you stop (to conserve your supply).   Try BREO 100  one click each am before your dental care  - fill the prescription only if it helps    Please schedule a follow up visit in 3 months but call sooner if needed

## 2021-04-02 NOTE — Assessment & Plan Note (Signed)
ERV 28% @ wt 238  01/02/21 PFTs c/w wt effects on lung vol  Body mass index is 42.8 kg/m.  -  trending dowwn Lab Results  Component Value Date   TSH 3.730   12/09/2007      Contributing to doe and risk of GERD >>>   reviewed the need and the process to achieve and maintain neg calorie balance > defer f/u primary care including intermittently monitoring thyroid status      Each maintenance medication was reviewed in detail including emphasizing most importantly the difference between maintenance and prns and under what circumstances the prns are to be triggered using an action plan format where appropriate.  Total time for H and P, chart review, counseling, reviewing dpi/elipta/02 device(s) , directly observing portions of ambulatory 02 saturation study/ and generating customized AVS unique to this office visit / same day charting  > 30 min

## 2021-04-02 NOTE — Progress Notes (Signed)
[name], female    DOB: 05-27-1941,   MRN: [id number]   Brief patient profile:  34 yowf  Quit smoking 1991 @  220 and did fine breathing including cabg/AVR 2009 until 2015 With afib onset of  doe ever since and worse since early 2022 so referred to pulmonary clinic 11/02/2020 by Dr  Sherril Croon.    History of Present Illness  11/02/2020  Pulmonary/ 1st office eval/[name]  Chief Complaint  Patient presents with   Pulmonary Consult    Referred by Dr Sherril Croon. Pt wanting POC. Has been on o2 24/7 for the past 3 wks.   Dyspnea:  75 ft / rides cart at grocery store x 6 months  Cough: no  Sleep: in recliner x 30 degrees x years  SABA use: none  Rec Make sure you check your oxygen saturation  at your highest level of activity  to be sure it stays over 90% and adjust  02 flow upward to maintain this level if needed but remember to turn it back to previous settings when you stop (to conserve your supply).  Please schedule a follow up office visit in 6 weeks, call sooner if needed  - [place] office  >>> pfts on return.   01/03/2021  f/u ov/[name] office/[name] re: new hypercarbic / hypoxemic RF/ MO/ diastolic dysfunction with MOD LAE on echo s/p AVR Chief Complaint  Patient presents with   Follow-up    Breathing is unchanged. No new co's.    Dyspnea:  walking 75 ft at home/ pushes cart  Cough: none  Sleeping: in chair x years x 45 degrees recliner  SABA use: none  02: 2lpm 24/7 not adjusting as rec  Covid status: vax x 3 , never infected  Lung cancer screening: n/a  Rec Make sure you check your oxygen saturation  at your highest level of activity  to be sure it stays over 90% and adjust  02 flow upward to maintain this level if needed but remember to turn it back to previous settings when you stop (to conserve your supply).  Your main problem with your lung function relates to your weight  - your TSH needs to be checked if not done recently  We will call to schedule you a high resolution CT   01/25/21> nl x cad     04/02/2021  f/u ov/[place] office/[name] re: obesity/02 dep  resp failure  maint on 02  2lpm 24/7   Chief Complaint  Patient presents with   Follow-up    2L O2 all of the time including while sleeping.   Sob is about the same as last OV.    Dyspnea:  walking inside house sometimes on 02,sometimes s it. Cough: none  Sleeping: 45 degrees recliner fine x years  SABA use: none  02: 2 lpm and mostly 2  Covid status: vax x 3      No obvious day to day or daytime variability or assoc excess/ purulent sputum or mucus plugs or hemoptysis or cp or chest tightness, subjective wheeze or overt sinus or hb symptoms.   Sleeping as above  without nocturnal  or early am exacerbation  of respiratory  c/o's or need for noct saba. Also denies any obvious fluctuation of symptoms with weather or environmental changes or other aggravating or alleviating factors except as outlined above   No unusual exposure hx or h/o childhood pna/ asthma or knowledge of premature birth.  Current Allergies, Complete Past Medical History, Past Surgical History,  Family History, and Social History were reviewed in [employer] record.  ROS  The following are not active complaints unless bolded Hoarseness, sore throat, dysphagia, dental problems, itching, sneezing,  nasal congestion or discharge of excess mucus or purulent secretions, ear ache,   fever, chills, sweats, unintended wt loss or wt gain, classically pleuritic or exertional cp,  orthopnea pnd or arm/hand swelling  or leg swelling, presyncope, palpitations, abdominal pain, anorexia, nausea, vomiting, diarrhea  or change in bowel habits or change in bladder habits, change in stools or change in urine, dysuria, hematuria,  rash, arthralgias, visual complaints, headache, numbness, weakness or ataxia or problems with walking or coordination,  change in mood or  memory.        Current Meds  Medication Sig   acetaminophen  (TYLENOL) 650 MG CR tablet Take 1,300 mg by mouth daily.   allopurinol (ZYLOPRIM) 300 MG tablet Take 300 mg by mouth daily.   apixaban (ELIQUIS) 5 MG TABS tablet TAKE 1 TABLET BY MOUTH TWICE DAILY   atorvastatin (LIPITOR) 10 MG tablet Take 20 mg by mouth in the morning and at bedtime.   docusate sodium (COLACE) 100 MG capsule Take 100 mg by mouth at bedtime.   fenofibrate (TRICOR) 145 MG tablet Take 145 mg by mouth daily.   ferrous sulfate 325 (65 FE) MG EC tablet Take 325 mg by mouth at bedtime.   furosemide (LASIX) 40 MG tablet Take 2 tablets (80 mg total) by mouth 2 (two) times daily.   levothyroxine (SYNTHROID, LEVOTHROID) 125 MCG tablet Take 125 mcg by mouth daily before breakfast.   Multiple Vitamins-Minerals (CENTRUM SILVER PO) Take 1 tablet by mouth at bedtime.   pantoprazole (PROTONIX) 40 MG tablet Take 40 mg by mouth daily.   potassium chloride SA (K-DUR,KLOR-CON) 20 MEQ tablet Take 20 mEq by mouth daily.   Vitamin D, Ergocalciferol, (DRISDOL) 50000 UNITS CAPS Take 50,000 Units by mouth every Sunday.              Past Medical History:  Diagnosis Date   Aortic stenosis    Arthritis    Atrial fibrillation (HCC)    Coronary atherosclerosis of native coronary artery    Multivessel   Essential hypertension    Gout    Hypothyroidism    Mixed hyperlipidemia    Psoriasis         Objective:     04/02/2021     23 4  11/21/20 240 lb (108.9 kg)  11/02/20 234 lb (106.1 kg)  10/24/20 229 lb 15 oz (104.3 kg)     Vital signs reviewed  04/02/2021  - Note at rest 02 sats  94% on 2lpm cont    General appearance:    obese wf barely able to get on exam table with one person assist     HEENT : pt wearing mask not removed for exam due to covid -19 concerns.    NECK :  without JVD/Nodes/TM/ nl carotid upstrokes bilaterally   LUNGS: no acc muscle use,  Nl contour chest which is clear to A and P bilaterally without cough on insp or exp maneuvers   CV:  RRR  no s3  2/6 sem   s  increase in P2, and trace pitting edema  R> L LE as usual per pt   ABD:  soft and nontender with nl inspiratory excursion in the supine position. No bruits or organomegaly appreciated, bowel sounds nl  MS:  Nl gait/ ext warm without deformities, calf  tenderness, cyanosis or clubbing No obvious joint restrictions   SKIN: warm and dry without lesions    NEURO:  alert, approp, nl sensorium with  no motor or cerebellar deficits apparent.                 Assessment

## 2021-04-02 NOTE — Assessment & Plan Note (Signed)
Quit smoking 1991 - PFT's  01/02/2021  FEV1 1.40 (74 % ) ratio 0.80  p 19 % improvement from saba p 0 prior to study with DLCO  18.22 (43%) corrects to 4.14 (77%)  for alv volume and FV curve min concavity with ERV 25% at wt 238  - 04/02/2021  After extensive coaching inhaler device,  effectiveness =    90% with dpi so try breo 100 1 click each am   Doubt asthma is the limiting factor but worth a 1 month free trail of laba/ics to find out

## 2021-04-02 NOTE — Assessment & Plan Note (Signed)
HC03  10/24/20  = 38  -  11/02/2020 Patient Saturations on Room Air at Rest = 90% while Ambulating = 88% then req 5 Liters of pulsed oxygen while Ambulating = 91% but able to walk 250 ft with min sob  - PFT's  01/02/2021  FEV1 1.40 (74 % ) ratio 0.80  p 19 % improvement from saba p 0 prior to study with DLCO  18.22 (43%) corrects to 4.14 (77%)  for alv volume and FV curve min concavity with ERV 25% at wt 238   - HRCT chest 01/25/21 No evidence of fibrotic interstitial lung disease. Minimal, bland appearing scarring of the bilateral lung bases. - 04/02/2021   Walked on 2pm cont x  3  lap(s) =  approx 525 ft  @ slow  pace, stopped due to sob with lowest 02 sats 94%    Appears to predominantly be obesity related > rec increase activity as tol but titrate 02 if needed to keep sats > 90%

## 2021-04-05 DIAGNOSIS — I5020 Unspecified systolic (congestive) heart failure: Secondary | ICD-10-CM | POA: Diagnosis not present

## 2021-04-05 DIAGNOSIS — R0602 Shortness of breath: Secondary | ICD-10-CM | POA: Diagnosis not present

## 2021-04-14 DIAGNOSIS — I5020 Unspecified systolic (congestive) heart failure: Secondary | ICD-10-CM | POA: Diagnosis not present

## 2021-04-14 DIAGNOSIS — R0602 Shortness of breath: Secondary | ICD-10-CM | POA: Diagnosis not present

## 2021-04-17 DIAGNOSIS — Z23 Encounter for immunization: Secondary | ICD-10-CM | POA: Diagnosis not present

## 2021-04-19 ENCOUNTER — Other Ambulatory Visit: Payer: Self-pay

## 2021-04-19 ENCOUNTER — Ambulatory Visit: Payer: Medicare PPO | Admitting: Cardiology

## 2021-04-19 ENCOUNTER — Encounter: Payer: Self-pay | Admitting: Cardiology

## 2021-04-19 VITALS — BP 108/39 | HR 55 | Ht 62.0 in | Wt 240.8 lb

## 2021-04-19 DIAGNOSIS — I4819 Other persistent atrial fibrillation: Secondary | ICD-10-CM

## 2021-04-19 DIAGNOSIS — Z79899 Other long term (current) drug therapy: Secondary | ICD-10-CM | POA: Diagnosis not present

## 2021-04-19 DIAGNOSIS — I25119 Atherosclerotic heart disease of native coronary artery with unspecified angina pectoris: Secondary | ICD-10-CM

## 2021-04-19 DIAGNOSIS — I5033 Acute on chronic diastolic (congestive) heart failure: Secondary | ICD-10-CM | POA: Diagnosis not present

## 2021-04-19 MED ORDER — METOPROLOL SUCCINATE ER 25 MG PO TB24: 12.5000 mg | ORAL_TABLET | Freq: Every day | ORAL | 3 refills | Status: DC

## 2021-04-19 NOTE — Patient Instructions (Addendum)
Medication Instructions:  Your physician has recommended you make the following change in your medication:  Decrease metoprolol succinate to 12.5 mg daily Continue other medications the same We will contact you about your fluid medication change after we get your lab work result [facility] has been contacted about not filling your pill pack and possible up coming changes. I spoke with Cheral Almas pharmacist.  Loney Laurence: BMET today at [facility]  Testing/Procedures: none  Follow-Up: Your physician recommends that you schedule a follow-up appointment in: 4-6 week  Any Other Special Instructions Will Be Listed Below (If Applicable).  If you need a refill on your cardiac medications before your next appointment, please call your pharmacy.

## 2021-04-19 NOTE — Progress Notes (Signed)
Cardiology Office Note  Date: 04/19/2021   ID: AOI KOUNS, DOB 05-13-1941, MRN [id number]  PCP:  Ignatius Specking, MD  Cardiologist:  [name], MD Electrophysiologist:  None   Chief Complaint  Patient presents with   Cardiac follow-up    History of Present Illness: [name] is a 80 y.o. female last seen in July.  She presents for a follow-up visit.  States that she has noticed more leg swelling, her weight is up about 6 pounds from prior baseline.  She reports compliance with Lasix 80 mg twice daily.  Last creatinine was 1.66 in May.  Also noted to be bradycardic, ECG shows sinus bradycardia at 55 bpm with PAC, right bundle branch block.  Toprol-XL dose was already cut back to 25 mg daily by PCP.  We discussed reducing it to 12.5 mg daily.  Past Medical History:  Diagnosis Date   Aortic stenosis    Arthritis    Atrial fibrillation (HCC)    Coronary atherosclerosis of native coronary artery    Multivessel   Essential hypertension    Gout    Hypothyroidism    Mixed hyperlipidemia    Psoriasis     Past Surgical History:  Procedure Laterality Date   AORTIC VALVE REPLACEMENT  2009   21 mm Edwards Magna pericardial valve   CARDIOVERSION N/A 10/20/2020   Procedure: CARDIOVERSION;  Surgeon: [name], MD;  Location: [facility];  Service: Cardiovascular;  Laterality: N/A;   CHOLECYSTECTOMY     2011   CORONARY ARTERY BYPASS GRAFT  2009   LIMA to LAD, SVG to RCA   JOINT REPLACEMENT  2012   L TOTAL KNEE   TOTAL KNEE ARTHROPLASTY  09/16/2011   Procedure: TOTAL KNEE ARTHROPLASTY;  Surgeon: Loanne Drilling, MD;  Location: [facility];  Service: Orthopedics;  Laterality: Right;   TOTAL THYROIDECTOMY  1970    Current Outpatient Medications  Medication Sig Dispense Refill   acetaminophen (TYLENOL) 650 MG CR tablet Take 1,300 mg by mouth daily.     allopurinol (ZYLOPRIM) 300 MG tablet Take 300 mg by mouth daily.  3   apixaban (ELIQUIS) 5 MG TABS tablet TAKE 1 TABLET BY  MOUTH TWICE DAILY 60 tablet 5   atorvastatin (LIPITOR) 10 MG tablet Take 20 mg by mouth in the morning and at bedtime.     docusate sodium (COLACE) 100 MG capsule Take 100 mg by mouth at bedtime.     fenofibrate (TRICOR) 145 MG tablet Take 145 mg by mouth daily.     ferrous sulfate 325 (65 FE) MG EC tablet Take 325 mg by mouth at bedtime.     fluticasone furoate-vilanterol (BREO ELLIPTA) 100-25 MCG/INH AEPB Inhale 1 puff into the lungs every morning. 28 each 11   fluticasone furoate-vilanterol (BREO ELLIPTA) 100-25 MCG/INH AEPB Inhale 1 puff into the lungs daily. 60 each 0   furosemide (LASIX) 40 MG tablet Take 2 tablets (80 mg total) by mouth 2 (two) times daily. 120 tablet 6   levothyroxine (SYNTHROID, LEVOTHROID) 125 MCG tablet Take 125 mcg by mouth daily before breakfast.     metoprolol succinate (TOPROL XL) 25 MG 24 hr tablet Take 0.5 tablets (12.5 mg total) by mouth daily. 15 tablet 3   Multiple Vitamins-Minerals (CENTRUM SILVER PO) Take 1 tablet by mouth at bedtime.     pantoprazole (PROTONIX) 40 MG tablet Take 40 mg by mouth daily.  4   potassium chloride SA (K-DUR,KLOR-CON) 20 MEQ tablet Take  20 mEq by mouth daily.     Vitamin D, Ergocalciferol, (DRISDOL) 50000 UNITS CAPS Take 50,000 Units by mouth every Sunday.     losartan (COZAAR) 25 MG tablet Take 1 tablet (25 mg total) by mouth daily. 90 tablet 3   No current facility-administered medications for this visit.   Allergies:  Statins and Penicillins   ROS: Continues to use supplemental oxygen.  Physical Exam: VS:  BP (!) 108/39   Pulse (!) 55   Ht 5\' 2"  (1.575 m)   Wt 240 lb 12.8 oz (109.2 kg)   LMP  (LMP Unknown)   SpO2 95%   BMI 44.04 kg/m , BMI Body mass index is 44.04 kg/m.  Wt Readings from Last 3 Encounters:  04/19/21 240 lb 12.8 oz (109.2 kg)  04/02/21 234 lb (106.1 kg)  01/15/21 236 lb (107 kg)    General: Patient appears comfortable at rest. HEENT: Conjunctiva and lids normal, wearing a mask. Neck: Supple,  no elevated JVP or carotid bruits, no thyromegaly. Lungs: Clear to auscultation, nonlabored breathing at rest. Cardiac: Regular rate and rhythm, no S3 or significant systolic murmur. Extremities: 2+ lower leg edema.  ECG:  An ECG dated 01/15/2021 was personally reviewed today and demonstrated:  Sinus rhythm with right bundle branch block.  Recent Labwork: 10/24/2020: ALT 13; AST 23; B Natriuretic Peptide 276.0; BUN 43; Creatinine, Ser 1.66; Hemoglobin 12.9; Platelets 158; Potassium 4.0; Sodium 141   Other Studies Reviewed Today:  Echocardiogram 06/28/2020:  1. Left ventricular ejection fraction, by estimation, is 60 to 65%. The  left ventricle has normal function. The left ventricle has no regional  wall motion abnormalities. Left ventricular diastolic parameters are  indeterminate.   2. Right ventricular systolic function is normal. The right ventricular  size is normal.   3. Left atrial size was moderately dilated.   4. Right atrial size was moderately dilated.   5. The mitral valve is normal in structure. No evidence of mitral valve  regurgitation. No evidence of mitral stenosis.   6. 21 mm Edwards Magna pericardial valve is in the AV position. . The  aortic valve has been repaired/replaced. Aortic valve regurgitation is not  visualized. No aortic stenosis is present.   High-resolution chest CT 01/25/2021: IMPRESSION: 1. No evidence of fibrotic interstitial lung disease. Minimal, bland appearing scarring of the bilateral lung bases. 2. No significant pulmonary edema. 3. Coronary artery disease.   Aortic Atherosclerosis (ICD10-I70.0).  Assessment and Plan:  1.  Acute on chronic diastolic heart failure, weight is up about 6 pounds.  She has been on Lasix 80 mg twice daily with potassium supplement.  Last creatinine 1.66 in May.  Plan to recheck BMET and most likely switch to Demadex at comparable dose.  2.  Persistent atrial fibrillation/flutter with CHA2DS2-VASc score of 5.  She is  in sinus bradycardia at this point following last cardioversion.  Plan to reduce Toprol-XL to 12.5 mg daily otherwise continue Eliquis for stroke prophylaxis.  3.  CAD status post CABG, no active angina at this time.  She is not on aspirin given use of Eliquis.  Continue Lipitor.  4.  Aortic stenosis status post bioprosthetic AVR, valve function stable by echocardiogram in January.  Medication Adjustments/Labs and Tests Ordered: Current medicines are reviewed at length with the patient today.  Concerns regarding medicines are outlined above.   Tests Ordered: Orders Placed This Encounter  Procedures   Basic metabolic panel   EKG 12-Lead    Medication Changes: Meds ordered  this encounter  Medications   metoprolol succinate (TOPROL XL) 25 MG 24 hr tablet    Sig: Take 0.5 tablets (12.5 mg total) by mouth daily.    Dispense:  15 tablet    Refill:  3    04/19/2021 dose decrease     Disposition:  Follow up  4 to 6 weeks.  Signed, [name], MD, [facility] 04/19/2021 2:34 PM    [facility] at [facility] [address] [place], [place], [place] [address] Phone: [phone]; Fax: [phone]

## 2021-04-20 ENCOUNTER — Telehealth: Payer: Self-pay | Admitting: *Deleted

## 2021-04-20 DIAGNOSIS — Z79899 Other long term (current) drug therapy: Secondary | ICD-10-CM

## 2021-04-20 DIAGNOSIS — I5032 Chronic diastolic (congestive) heart failure: Secondary | ICD-10-CM

## 2021-04-20 MED ORDER — TORSEMIDE 20 MG PO TABS: 60.0000 mg | ORAL_TABLET | Freq: Two times a day (BID) | ORAL | 1 refills | Status: DC

## 2021-04-20 MED ORDER — TORSEMIDE 60 MG PO TABS: 1.0000 | ORAL_TABLET | Freq: Two times a day (BID) | ORAL | 3 refills | Status: DC

## 2021-04-20 NOTE — Telephone Encounter (Signed)
Changed torsemide 60 mg tx to tosemide 20 mg (taking 3 BID) 60 mg rx required prior auth

## 2021-04-20 NOTE — Addendum Note (Signed)
Addended by: Eustace Moore on: 04/20/2021 02:56 PM   Modules accepted: Orders

## 2021-04-20 NOTE — Telephone Encounter (Signed)
Patient informed and verbalized understanding of plan. Lab order faxed to [facility]. Copy sent to PCP. 

## 2021-04-20 NOTE — Telephone Encounter (Signed)
-----   Message from [name], MD sent at 04/19/2021  4:30 PM EDT ----- Results reviewed.  Creatinine is relatively stable at 1.54 and potassium 4.6.  I would suggest that she stop her current Lasix and switch to Demadex 60 mg twice daily, continue potassium supplement.  Make sure that she has a follow-up BMET for her next visit.

## 2021-05-06 DIAGNOSIS — R0602 Shortness of breath: Secondary | ICD-10-CM | POA: Diagnosis not present

## 2021-05-06 DIAGNOSIS — I5020 Unspecified systolic (congestive) heart failure: Secondary | ICD-10-CM | POA: Diagnosis not present

## 2021-05-31 ENCOUNTER — Ambulatory Visit: Payer: Medicare PPO | Admitting: Cardiology

## 2021-06-04 ENCOUNTER — Other Ambulatory Visit: Payer: Self-pay | Admitting: Cardiology

## 2021-06-04 DIAGNOSIS — D6869 Other thrombophilia: Secondary | ICD-10-CM | POA: Diagnosis not present

## 2021-06-04 DIAGNOSIS — Z299 Encounter for prophylactic measures, unspecified: Secondary | ICD-10-CM | POA: Diagnosis not present

## 2021-06-04 DIAGNOSIS — I25119 Atherosclerotic heart disease of native coronary artery with unspecified angina pectoris: Secondary | ICD-10-CM | POA: Diagnosis not present

## 2021-06-04 DIAGNOSIS — J440 Chronic obstructive pulmonary disease with acute lower respiratory infection: Secondary | ICD-10-CM | POA: Diagnosis not present

## 2021-06-04 DIAGNOSIS — J209 Acute bronchitis, unspecified: Secondary | ICD-10-CM | POA: Diagnosis not present

## 2021-06-05 DIAGNOSIS — R0602 Shortness of breath: Secondary | ICD-10-CM | POA: Diagnosis not present

## 2021-06-05 DIAGNOSIS — I5020 Unspecified systolic (congestive) heart failure: Secondary | ICD-10-CM | POA: Diagnosis not present

## 2021-06-11 DIAGNOSIS — R1013 Epigastric pain: Secondary | ICD-10-CM | POA: Diagnosis not present

## 2021-06-11 DIAGNOSIS — Z299 Encounter for prophylactic measures, unspecified: Secondary | ICD-10-CM | POA: Diagnosis not present

## 2021-06-11 DIAGNOSIS — Z6841 Body Mass Index (BMI) 40.0 and over, adult: Secondary | ICD-10-CM | POA: Diagnosis not present

## 2021-06-11 DIAGNOSIS — J209 Acute bronchitis, unspecified: Secondary | ICD-10-CM | POA: Diagnosis not present

## 2021-06-11 DIAGNOSIS — J440 Chronic obstructive pulmonary disease with acute lower respiratory infection: Secondary | ICD-10-CM | POA: Diagnosis not present

## 2021-07-03 DIAGNOSIS — I25119 Atherosclerotic heart disease of native coronary artery with unspecified angina pectoris: Secondary | ICD-10-CM | POA: Diagnosis not present

## 2021-07-03 DIAGNOSIS — I10 Essential (primary) hypertension: Secondary | ICD-10-CM | POA: Diagnosis not present

## 2021-07-03 DIAGNOSIS — R600 Localized edema: Secondary | ICD-10-CM | POA: Diagnosis not present

## 2021-07-03 DIAGNOSIS — I5032 Chronic diastolic (congestive) heart failure: Secondary | ICD-10-CM | POA: Diagnosis not present

## 2021-07-03 DIAGNOSIS — Z299 Encounter for prophylactic measures, unspecified: Secondary | ICD-10-CM | POA: Diagnosis not present

## 2021-07-04 ENCOUNTER — Other Ambulatory Visit: Payer: Self-pay | Admitting: Cardiology

## 2021-07-06 DIAGNOSIS — R0602 Shortness of breath: Secondary | ICD-10-CM | POA: Diagnosis not present

## 2021-07-06 DIAGNOSIS — I5020 Unspecified systolic (congestive) heart failure: Secondary | ICD-10-CM | POA: Diagnosis not present

## 2021-07-10 ENCOUNTER — Encounter: Payer: Self-pay | Admitting: Cardiology

## 2021-07-10 ENCOUNTER — Ambulatory Visit: Payer: Medicare PPO | Admitting: Cardiology

## 2021-07-10 ENCOUNTER — Other Ambulatory Visit: Payer: Self-pay | Admitting: *Deleted

## 2021-07-10 VITALS — BP 138/70 | HR 73 | Ht 63.0 in | Wt 233.8 lb

## 2021-07-10 DIAGNOSIS — Z953 Presence of xenogenic heart valve: Secondary | ICD-10-CM | POA: Diagnosis not present

## 2021-07-10 DIAGNOSIS — I4819 Other persistent atrial fibrillation: Secondary | ICD-10-CM | POA: Diagnosis not present

## 2021-07-10 DIAGNOSIS — Z79899 Other long term (current) drug therapy: Secondary | ICD-10-CM

## 2021-07-10 DIAGNOSIS — I5032 Chronic diastolic (congestive) heart failure: Secondary | ICD-10-CM

## 2021-07-10 NOTE — Patient Instructions (Addendum)
Medication Instructions:  Your physician recommends that you continue on your current medications as directed. Please refer to the Current Medication list given to you today.  Labwork: BMET   Testing/Procedures: none  Follow-Up: Your physician recommends that you schedule a follow-up appointment in: 3 months  Any Other Special Instructions Will Be Listed Below (If Applicable).  If you need a refill on your cardiac medications before your next appointment, please call your pharmacy.

## 2021-07-10 NOTE — Progress Notes (Signed)
Cardiology Office Note  Date: 07/10/2021   ID: [name], DOB 09-12-1940, MRN [id number]  PCP:  Ignatius Specking, MD  Cardiologist:  [name], MD Electrophysiologist:  None   Chief Complaint  Patient presents with   Cardiac follow-up    History of Present Illness: [name] is an 81 y.o. female last seen in October 2022.  She is here for a follow-up visit.  Reports no major change, stable NYHA class III dyspnea and remains on supplemental oxygen.  Her weight is down about 7 pounds compared to last visit. She was switched from Lasix to [facility] and also had reduction in Toprol-XL dosing following last encounter.  I reviewed her medications which are otherwise stable and outlined below.  We did discuss getting a follow-up BMET.  Past Medical History:  Diagnosis Date   Aortic stenosis    Arthritis    Atrial fibrillation (HCC)    Coronary atherosclerosis of native coronary artery    Multivessel   Essential hypertension    Gout    Hypothyroidism    Mixed hyperlipidemia    Psoriasis     Past Surgical History:  Procedure Laterality Date   AORTIC VALVE REPLACEMENT  2009   21 mm Edwards Magna pericardial valve   CARDIOVERSION N/A 10/20/2020   Procedure: CARDIOVERSION;  Surgeon: [name], MD;  Location: [facility];  Service: Cardiovascular;  Laterality: N/A;   CHOLECYSTECTOMY     2011   CORONARY ARTERY BYPASS GRAFT  2009   LIMA to LAD, SVG to RCA   JOINT REPLACEMENT  2012   L TOTAL KNEE   TOTAL KNEE ARTHROPLASTY  09/16/2011   Procedure: TOTAL KNEE ARTHROPLASTY;  Surgeon: Loanne Drilling, MD;  Location: [facility];  Service: Orthopedics;  Laterality: Right;   TOTAL THYROIDECTOMY  1970    Current Outpatient Medications  Medication Sig Dispense Refill   acetaminophen (TYLENOL) 650 MG CR tablet Take 1,300 mg by mouth daily.     allopurinol (ZYLOPRIM) 300 MG tablet Take 300 mg by mouth daily.  3   apixaban (ELIQUIS) 5 MG TABS tablet TAKE 1 TABLET BY MOUTH TWICE  DAILY 60 tablet 5   atorvastatin (LIPITOR) 10 MG tablet Take 20 mg by mouth in the morning and at bedtime.     docusate sodium (COLACE) 100 MG capsule Take 100 mg by mouth at bedtime.     fenofibrate (TRICOR) 145 MG tablet Take 145 mg by mouth daily.     ferrous sulfate 325 (65 FE) MG EC tablet Take 325 mg by mouth at bedtime.     levothyroxine (SYNTHROID, LEVOTHROID) 125 MCG tablet Take 125 mcg by mouth daily before breakfast.     losartan (COZAAR) 25 MG tablet TAKE 1 TABLET BY MOUTH DAILY 90 tablet 3   metoprolol succinate (TOPROL XL) 25 MG 24 hr tablet Take 0.5 tablets (12.5 mg total) by mouth daily. 15 tablet 3   Multiple Vitamins-Minerals (CENTRUM SILVER PO) Take 1 tablet by mouth at bedtime.     pantoprazole (PROTONIX) 40 MG tablet Take 40 mg by mouth daily.  4   potassium chloride SA (K-DUR,KLOR-CON) 20 MEQ tablet Take 20 mEq by mouth daily.     torsemide (DEMADEX) 20 MG tablet TAKE THREE TABLETS BY MOUTH TWICE DAILY 180 tablet 1   Vitamin D, Ergocalciferol, (DRISDOL) 50000 UNITS CAPS Take 50,000 Units by mouth every Sunday.     No current facility-administered medications for this visit.   Allergies:  Statins  and Penicillins   ROS: No orthopnea or PND.  Physical Exam: VS:  BP 138/70    Pulse 73    Ht 5\' 3"  (1.6 m)    Wt 233 lb 12.8 oz (106.1 kg)    LMP  (LMP Unknown)    SpO2 96%    BMI 41.42 kg/m , BMI Body mass index is 41.42 kg/m.  Wt Readings from Last 3 Encounters:  07/10/21 233 lb 12.8 oz (106.1 kg)  04/19/21 240 lb 12.8 oz (109.2 kg)  04/02/21 234 lb (106.1 kg)    General: Patient appears comfortable at rest. HEENT: Conjunctiva and lids normal, wearing a mask. Neck: Supple, no elevated JVP or carotid bruits, no thyromegaly. Lungs: Clear to auscultation, nonlabored breathing at rest. Cardiac: Regular rate and rhythm, no S3 or significant systolic murmur, no pericardial rub. Extremities: Improved lower leg edema, 1-2+..  ECG:  An ECG dated 04/19/2021 was personally  reviewed today and demonstrated:  Sinus bradycardia with PAC, right bundle branch block.  Recent Labwork: 10/24/2020: ALT 13; AST 23; B Natriuretic Peptide 276.0; BUN 43; Creatinine, Ser 1.66; Hemoglobin 12.9; Platelets 158; Potassium 4.0; Sodium 141  October 2022: Potassium 4.6, BUN 39, creatinine 1.54  Other Studies Reviewed Today:  Echocardiogram 06/28/2020:  1. Left ventricular ejection fraction, by estimation, is 60 to 65%. The  left ventricle has normal function. The left ventricle has no regional  wall motion abnormalities. Left ventricular diastolic parameters are  indeterminate.   2. Right ventricular systolic function is normal. The right ventricular  size is normal.   3. Left atrial size was moderately dilated.   4. Right atrial size was moderately dilated.   5. The mitral valve is normal in structure. No evidence of mitral valve  regurgitation. No evidence of mitral stenosis.   6. 21 mm Edwards Magna pericardial valve is in the AV position. . The  aortic valve has been repaired/replaced. Aortic valve regurgitation is not  visualized. No aortic stenosis is present.    High-resolution chest CT 01/25/2021: IMPRESSION: 1. No evidence of fibrotic interstitial lung disease. Minimal, bland appearing scarring of the bilateral lung bases. 2. No significant pulmonary edema. 3. Coronary artery disease.   Aortic Atherosclerosis (ICD10-I70.0).  Assessment and Plan:  1.  HFpEF with chronic diastolic heart failure, LVEF 60 to 65% by last assessment.  She has tolerated switch from Lasix to Demadex, weight is down and she feels better overall.  Plan to recheck BMET.  Otherwise continue potassium, Toprol-XL, and losartan.  2.  Persistent atrial fibrillation/flutter with CHA2DS2-VASc score of 5.  She is symptomatically stable and continues on Toprol-XL along with Eliquis.  3.  CAD status post CABG, no active angina symptoms.  Continue Lipitor.  4.  History of aortic stenosis status post  bioprosthetic AVR.  Prosthetic function normal with mean gradient 10 mmHg by follow-up echocardiogram in January 2022.  Medication Adjustments/Labs and Tests Ordered: Current medicines are reviewed at length with the patient today.  Concerns regarding medicines are outlined above.   Tests Ordered: Orders Placed This Encounter  Procedures   Basic metabolic panel    Medication Changes: No orders of the defined types were placed in this encounter.   Disposition:  Follow up  3 months.  Signed, Satira Sark, MD, [facility] 07/10/2021 1:17 PM    [facility] [place], [place], [place] [address] Phone: [phone]; Fax: [phone]

## 2021-07-11 ENCOUNTER — Telehealth: Payer: Self-pay | Admitting: *Deleted

## 2021-07-11 NOTE — Telephone Encounter (Signed)
-----   Message from [name], MD sent at 07/10/2021  3:51 PM EST ----- Results reviewed.  Potassium 4.5.  Creatinine 1.55 which is relatively stable.  Continue with current medications and follow-up plan.

## 2021-07-11 NOTE — Telephone Encounter (Signed)
Patient informed. Copy sent to PCP °

## 2021-07-23 ENCOUNTER — Encounter: Payer: Self-pay | Admitting: Internal Medicine

## 2021-07-23 ENCOUNTER — Other Ambulatory Visit: Payer: Self-pay

## 2021-07-23 ENCOUNTER — Ambulatory Visit: Payer: Medicare PPO | Admitting: Internal Medicine

## 2021-07-23 DIAGNOSIS — J9612 Chronic respiratory failure with hypercapnia: Secondary | ICD-10-CM | POA: Diagnosis not present

## 2021-07-23 DIAGNOSIS — J449 Chronic obstructive pulmonary disease, unspecified: Secondary | ICD-10-CM | POA: Diagnosis not present

## 2021-07-23 DIAGNOSIS — J9611 Chronic respiratory failure with hypoxia: Secondary | ICD-10-CM | POA: Diagnosis not present

## 2021-07-23 NOTE — Assessment & Plan Note (Signed)
HC03  10/24/20  = 38  -  11/02/2020 Patient Saturations on Room Air at Rest = 90% while Ambulating = 88% then req 5 Liters of pulsed oxygen while Ambulating = 91% but able to walk 250 ft with min sob  - PFT's  01/02/2021  FEV1 1.40 (74 % ) ratio 0.80  p 19 % improvement from saba p 0 prior to study with DLCO  18.22 (43%) corrects to 4.14 (77%)  for alv volume and FV curve min concavity with ERV 25% at wt 238   - HRCT chest 01/25/21 No evidence of fibrotic interstitial lung disease. Minimal, bland appearing scarring of the bilateral lung bases. - 04/02/2021   Walked on 2pm cont x  3  lap(s) =  approx 525 ft  @ slow  pace, stopped due to sob with lowest 02 sats 94%   - 07/23/2021   Walked on 2lpm   x  one  lap(s) =  approx 150  ft  @ moderate walker  pace, stopped due to   with lowest 02 sats 95%  Most likely OHS and well compensated, but rec optimal vol status with diuretic rx per cards and titrate 02 to sats of > 90%   Each maintenance medication was reviewed in detail including emphasizing most importantly the difference between maintenance and prns and under what circumstances the prns are to be triggered using an action plan format where appropriate.  Total time for H and P, chart review, counseling,  directly observing portions of ambulatory 02 saturation study/ and generating customized AVS unique to this office visit / same day charting =32 min

## 2021-07-23 NOTE — Patient Instructions (Signed)
If your breathing gets worse you will need a chest xray but most likely this is related to your fluid excess and Dr Diona Browner is taking care of this   No change in your oxygen - Make sure you check your oxygen saturation  AT  your highest level of activity (not after you stop)   to be sure it stays over 90% and adjust  02 flow upward to maintain this level if needed but remember to turn it back to previous settings when you stop (to conserve your supply).   Please schedule a follow up visit in 12 months but call sooner if needed

## 2021-07-23 NOTE — Assessment & Plan Note (Signed)
Quit smoking 1991 - PFT's  01/02/2021  FEV1 1.40 (74 % ) ratio 0.80  p 19 % improvement from saba p 0 prior to study with DLCO  18.22 (43%) corrects to 4.14 (77%)  for alv volume and FV curve min concavity with ERV 25% at wt 238  - 04/02/2021  After extensive coaching inhaler device,  effectiveness =    90% with dpi so try breo [id number] 1 click each am > as of 07/23/2021 doing just as well without inhalers so remained off   Most of her symptoms at this point appear to  Be driven by chf/ obesity so f/u can be yearly with more close f/u by cards

## 2021-07-23 NOTE — Progress Notes (Signed)
[name], female    DOB: 30-Sep-1940,   MRN: [id number]   Brief patient profile:  55  yowf  Quit smoking 1991 @  220 and did fine breathing including cabg/AVR 2009 until 2015 With afib onset of  doe ever since and worse since early 2022 so referred to pulmonary clinic 11/02/2020 by Dr  Woody Seller.    History of Present Illness  11/02/2020  [name]  Chief Complaint  Patient presents with   Pulmonary Consult    Referred by Dr Woody Seller. Pt wanting POC. Has been on o2 24/7 for the past 3 wks.   Dyspnea:  75 ft / rides cart at grocery store x 6 months  Cough: no  Sleep: in recliner x 30 degrees x years  SABA use: none  Rec Make sure you check your oxygen saturation  at your highest level of activity  to be sure it stays over 90% and adjust  02 flow upward to maintain this level if needed but remember to turn it back to previous settings when you stop (to conserve your supply).  Please schedule a follow up office visit in 6 weeks, call sooner if needed  - [name] office  >>> pfts on return.   01/03/2021  f/u ov/[name] office/[name] re: new hypercarbic / hypoxemic RF/ MO/ diastolic dysfunction with MOD LAE on echo s/p AVR Chief Complaint  Patient presents with   Follow-up    Breathing is unchanged. No new co's.    Dyspnea:  walking 75 ft at home/ pushes cart  Cough: none  Sleeping: in chair x years x 45 degrees recliner  SABA use: none  02: 2lpm 24/7 not adjusting as rec  Covid status: vax x 3 , never infected  Lung cancer screening: n/a  Rec Make sure you check your oxygen saturation  at your highest level of activity  to be sure it stays over 90% and adjust  02 flow upward to maintain this level if needed but remember to turn it back to previous settings when you stop (to conserve your supply).  Your main problem with your lung function relates to your weight  - your TSH needs to be checked if not done recently  We will call to schedule you a high resolution CT   01/25/21> nl x cad     04/02/2021  f/u ov/[name] office/[name] re: obesity/02 dep  resp failure  maint on 02  2lpm 24/7   Chief Complaint  Patient presents with   Follow-up    2L O2 all of the time including while sleeping.   Sob is about the same as last OV.   Dyspnea:  walking inside house sometimes on 02,sometimes s it. Cough: none  Sleeping: 45 degrees recliner fine x years  SABA use: none  02: 2 lpm and mostly 2  Covid status: vax x 3  Rec We will walk you today to help you understand how much 0xygen you need walking vs sitting Make sure you check your oxygen saturation  at your highest level of activity  Try BREO [id number]  one click each am before your dental care  - fill the prescription only if it helps  Please schedule a follow up visit in 3 months but call sooner if needed      07/23/2021  f/u ov/[name] office/[name] re: obesity/resp failure maint on no inhalers   Chief Complaint  Patient presents with   Follow-up    Patient feels like she is doing  good, no concerns  Dyspnea:  across the house, uses [facility] parking and drives herself Cough: none  Sleeping: in recliner 30 degrees, breathing on 2lpm  SABA use: none  02: 2lpm 24/7  Covid status: vax x 4      No obvious day to day or daytime variability or assoc excess/ purulent sputum or mucus plugs or hemoptysis or cp or chest tightness, subjective wheeze or overt sinus or hb symptoms.   Sleeping as above  without nocturnal  or early am exacerbation  of respiratory  c/o's or need for noct saba. Also denies any obvious fluctuation of symptoms with weather or environmental changes or other aggravating or alleviating factors except as outlined above   No unusual exposure hx or h/o childhood pna/ asthma or knowledge of premature birth.  Current Allergies, Complete Past Medical History, Past Surgical History, Family History, and Social History were reviewed in [employer] record.  ROS  The following are  not active complaints unless bolded Hoarseness, sore throat, dysphagia, dental problems, itching, sneezing,  nasal congestion or discharge of excess mucus or purulent secretions, ear ache,   fever, chills, sweats, unintended wt loss or wt gain, classically pleuritic or exertional cp,  orthopnea pnd or arm/hand swelling  or leg swelling always R > L  presyncope, palpitations, abdominal pain, anorexia, nausea, vomiting, diarrhea  or change in bowel habits or change in bladder habits, change in stools or change in urine, dysuria, hematuria,  rash, arthralgias, visual complaints, headache, numbness, weakness or ataxia or problems with walking/ uses walker or coordination,  change in mood or  memory.        Current Meds  Medication Sig   acetaminophen (TYLENOL) 650 MG CR tablet Take 1,300 mg by mouth daily.   allopurinol (ZYLOPRIM) 300 MG tablet Take 300 mg by mouth daily.   apixaban (ELIQUIS) 5 MG TABS tablet TAKE 1 TABLET BY MOUTH TWICE DAILY   atorvastatin (LIPITOR) 10 MG tablet Take 20 mg by mouth in the morning and at bedtime.   docusate sodium (COLACE) 100 MG capsule Take 100 mg by mouth at bedtime.   fenofibrate (TRICOR) 145 MG tablet Take 145 mg by mouth daily.   ferrous sulfate 325 (65 FE) MG EC tablet Take 325 mg by mouth at bedtime.   levothyroxine (SYNTHROID, LEVOTHROID) 125 MCG tablet Take 125 mcg by mouth daily before breakfast.   losartan (COZAAR) 25 MG tablet TAKE 1 TABLET BY MOUTH DAILY   metoprolol succinate (TOPROL XL) 25 MG 24 hr tablet Take 0.5 tablets (12.5 mg total) by mouth daily.   Multiple Vitamins-Minerals (CENTRUM SILVER PO) Take 1 tablet by mouth at bedtime.   pantoprazole (PROTONIX) 40 MG tablet Take 40 mg by mouth daily.   potassium chloride SA (K-DUR,KLOR-CON) 20 MEQ tablet Take 20 mEq by mouth daily.   torsemide (DEMADEX) 20 MG tablet TAKE THREE TABLETS BY MOUTH TWICE DAILY   Vitamin D, Ergocalciferol, (DRISDOL) 50000 UNITS CAPS Take 50,000 Units by mouth every Sunday.                 Past Medical History:  Diagnosis Date   Aortic stenosis    Arthritis    Atrial fibrillation (HCC)    Coronary atherosclerosis of native coronary artery    Multivessel   Essential hypertension    Gout    Hypothyroidism    Mixed hyperlipidemia    Psoriasis         Objective:    Wts  07/23/2021  238   04/02/2021     234  11/21/20 240 lb (108.9 kg)  11/02/20 234 lb (106.1 kg)  10/24/20 229 lb 15 oz (104.3 kg)      Vital signs reviewed  07/23/2021  - Note at rest 02 sats  98% on 2lpm POC   General appearance:    obese wf using rolling walker and basket for POC      2/6 sem   1+ ptting L, 2+ R  Crackles insp both bases    HEENT : pt wearing mask not removed for exam due to covid -19 concerns.    NECK :  without JVD/Nodes/TM/ nl carotid upstrokes bilaterally   LUNGS: no acc muscle use,  Nl contour chest with insp basilar crackles bilaterally without cough on insp or exp maneuvers   CV:  RRR  no s3 or 2/6 SEM s increase in P2, and no edema   ABD:  soft and nontender with nl inspiratory excursion in the supine position. No bruits or organomegaly appreciated, bowel sounds nl  MS:  Nl gait/ ext warm without deformities, calf tenderness, cyanosis or clubbing No obvious joint restrictions   SKIN: warm and dry without lesions    NEURO:  alert, approp, nl sensorium with  no motor or cerebellar deficits apparent.            07/23/2021 rec cxr/ declined         Assessment

## 2021-08-02 DIAGNOSIS — J9611 Chronic respiratory failure with hypoxia: Secondary | ICD-10-CM | POA: Diagnosis not present

## 2021-08-02 DIAGNOSIS — Z299 Encounter for prophylactic measures, unspecified: Secondary | ICD-10-CM | POA: Diagnosis not present

## 2021-08-02 DIAGNOSIS — R600 Localized edema: Secondary | ICD-10-CM | POA: Diagnosis not present

## 2021-08-02 DIAGNOSIS — I10 Essential (primary) hypertension: Secondary | ICD-10-CM | POA: Diagnosis not present

## 2021-08-02 DIAGNOSIS — I5032 Chronic diastolic (congestive) heart failure: Secondary | ICD-10-CM | POA: Diagnosis not present

## 2021-08-05 ENCOUNTER — Other Ambulatory Visit: Payer: Self-pay | Admitting: Cardiology

## 2021-08-06 DIAGNOSIS — R0602 Shortness of breath: Secondary | ICD-10-CM | POA: Diagnosis not present

## 2021-08-06 DIAGNOSIS — I5032 Chronic diastolic (congestive) heart failure: Secondary | ICD-10-CM | POA: Diagnosis not present

## 2021-08-06 DIAGNOSIS — Z299 Encounter for prophylactic measures, unspecified: Secondary | ICD-10-CM | POA: Diagnosis not present

## 2021-08-06 DIAGNOSIS — L409 Psoriasis, unspecified: Secondary | ICD-10-CM | POA: Diagnosis not present

## 2021-08-06 DIAGNOSIS — Z6841 Body Mass Index (BMI) 40.0 and over, adult: Secondary | ICD-10-CM | POA: Diagnosis not present

## 2021-08-06 DIAGNOSIS — I5020 Unspecified systolic (congestive) heart failure: Secondary | ICD-10-CM | POA: Diagnosis not present

## 2021-08-06 DIAGNOSIS — I10 Essential (primary) hypertension: Secondary | ICD-10-CM | POA: Diagnosis not present

## 2021-08-06 NOTE — Telephone Encounter (Signed)
Staff msg sent to pharamcist

## 2021-08-06 NOTE — Telephone Encounter (Signed)
Prescription refill request for Eliquis received. Indication: afib  Last office visit: [place], 07/10/2021 Scr: 1.55, 07/10/2021 Age: 81 yo  Weight: 108 kg  Pt qualifies for a dose decrease per dosing criteria.

## 2021-08-07 MED ORDER — APIXABAN 2.5 MG PO TABS: 2.5000 mg | ORAL_TABLET | Freq: Two times a day (BID) | ORAL | 5 refills | Status: DC

## 2021-08-07 NOTE — Telephone Encounter (Signed)
Agree with dose decrease to Eliquis 2.5mg  BID, last 3 SCr all > 1.5 and pt is 80

## 2021-08-14 DIAGNOSIS — I5032 Chronic diastolic (congestive) heart failure: Secondary | ICD-10-CM | POA: Diagnosis not present

## 2021-09-03 ENCOUNTER — Other Ambulatory Visit: Payer: Self-pay | Admitting: Cardiology

## 2021-09-03 DIAGNOSIS — R0602 Shortness of breath: Secondary | ICD-10-CM | POA: Diagnosis not present

## 2021-09-03 DIAGNOSIS — I5020 Unspecified systolic (congestive) heart failure: Secondary | ICD-10-CM | POA: Diagnosis not present

## 2021-09-10 DIAGNOSIS — I10 Essential (primary) hypertension: Secondary | ICD-10-CM | POA: Diagnosis not present

## 2021-09-10 DIAGNOSIS — Z299 Encounter for prophylactic measures, unspecified: Secondary | ICD-10-CM | POA: Diagnosis not present

## 2021-09-10 DIAGNOSIS — D692 Other nonthrombocytopenic purpura: Secondary | ICD-10-CM | POA: Diagnosis not present

## 2021-09-10 DIAGNOSIS — N1832 Chronic kidney disease, stage 3b: Secondary | ICD-10-CM | POA: Diagnosis not present

## 2021-09-10 DIAGNOSIS — I5032 Chronic diastolic (congestive) heart failure: Secondary | ICD-10-CM | POA: Diagnosis not present

## 2021-10-04 DIAGNOSIS — R0602 Shortness of breath: Secondary | ICD-10-CM | POA: Diagnosis not present

## 2021-10-04 DIAGNOSIS — I5020 Unspecified systolic (congestive) heart failure: Secondary | ICD-10-CM | POA: Diagnosis not present

## 2021-10-09 DIAGNOSIS — Z1331 Encounter for screening for depression: Secondary | ICD-10-CM | POA: Diagnosis not present

## 2021-10-09 DIAGNOSIS — Z299 Encounter for prophylactic measures, unspecified: Secondary | ICD-10-CM | POA: Diagnosis not present

## 2021-10-09 DIAGNOSIS — Z0000 Encounter for general adult medical examination without abnormal findings: Secondary | ICD-10-CM | POA: Diagnosis not present

## 2021-10-09 DIAGNOSIS — I5032 Chronic diastolic (congestive) heart failure: Secondary | ICD-10-CM | POA: Diagnosis not present

## 2021-10-09 DIAGNOSIS — Z6841 Body Mass Index (BMI) 40.0 and over, adult: Secondary | ICD-10-CM | POA: Diagnosis not present

## 2021-10-09 DIAGNOSIS — I10 Essential (primary) hypertension: Secondary | ICD-10-CM | POA: Diagnosis not present

## 2021-10-09 DIAGNOSIS — Z1339 Encounter for screening examination for other mental health and behavioral disorders: Secondary | ICD-10-CM | POA: Diagnosis not present

## 2021-10-09 DIAGNOSIS — Z7189 Other specified counseling: Secondary | ICD-10-CM | POA: Diagnosis not present

## 2021-10-10 NOTE — Progress Notes (Signed)
? ? ?Cardiology Office Note ? ?Date: 10/11/2021  ? ?ID: [name], DOB May 31, 1941, MRN [id number] ? ?PCP:  Glenda Chroman, MD  ?Cardiologist:  Rozann Lesches, MD ?Electrophysiologist:  None  ? ?Chief Complaint  ?Patient presents with  ? Cardiac follow-up  ? ? ?History of Present Illness: ?[name] is an 81 y.o. female last seen in January.  She presents for a follow-up visit.  States that she has had worsening leg swelling, her weight is also up about 6 pounds.  She has been consistent with Demadex and potassium dosing.  Given metolazone 5 mg tablets to take for 2 days by Dr. Woody Seller, she states that this did not increase her urination. ? ?I went over her medications today. There have been no other changes from a cardiac perspective.  Her blood pressure is elevated today although heart rate well controlled.  She does not report any sense of palpitations or chest pain.  Remains on supplemental oxygen as before. ? ?Last blood work was in January at which point creatinine was 1.55 and potassium 4.5. ? ?Past Medical History:  ?Diagnosis Date  ? Aortic stenosis   ? Arthritis   ? Atrial fibrillation ([place])   ? Coronary atherosclerosis of native coronary artery   ? Multivessel  ? Essential hypertension   ? Gout   ? Hypothyroidism   ? Mixed hyperlipidemia   ? Psoriasis   ? ? ?Past Surgical History:  ?Procedure Laterality Date  ? AORTIC VALVE REPLACEMENT  2009  ? 21 mm Edwards Magna pericardial valve  ? CARDIOVERSION N/A 10/20/2020  ? Procedure: CARDIOVERSION;  Surgeon: Satira Sark, MD;  Location: [facility];  Service: Cardiovascular;  Laterality: N/A;  ? CHOLECYSTECTOMY    ? 2011  ? CORONARY ARTERY BYPASS GRAFT  2009  ? LIMA to LAD, SVG to RCA  ? JOINT REPLACEMENT  2012  ? L TOTAL KNEE  ? TOTAL KNEE ARTHROPLASTY  09/16/2011  ? Procedure: TOTAL KNEE ARTHROPLASTY;  Surgeon: Gearlean Alf, MD;  Location: [facility];  Service: Orthopedics;  Laterality: Right;  ? TOTAL THYROIDECTOMY  1970  ? ? ?Current Outpatient Medications   ?Medication Sig Dispense Refill  ? acetaminophen (TYLENOL) 650 MG CR tablet Take 1,300 mg by mouth daily.    ? allopurinol (ZYLOPRIM) 300 MG tablet Take 300 mg by mouth daily.  3  ? apixaban (ELIQUIS) 2.5 MG TABS tablet Take 1 tablet (2.5 mg total) by mouth 2 (two) times daily. 60 tablet 5  ? atorvastatin (LIPITOR) 10 MG tablet Take 20 mg by mouth in the morning and at bedtime.    ? docusate sodium (COLACE) 100 MG capsule Take 100 mg by mouth at bedtime.    ? fenofibrate (TRICOR) 145 MG tablet Take 145 mg by mouth daily.    ? ferrous sulfate 325 (65 FE) MG EC tablet Take 325 mg by mouth at bedtime.    ? levothyroxine (SYNTHROID, LEVOTHROID) 125 MCG tablet Take 125 mcg by mouth daily before breakfast.    ? losartan (COZAAR) 25 MG tablet TAKE 1 TABLET BY MOUTH DAILY 90 tablet 3  ? metoprolol succinate (TOPROL-XL) 25 MG 24 hr tablet TAKE 1/2 TABLET BY MOUTH DAILY 45 tablet 3  ? Multiple Vitamins-Minerals (CENTRUM SILVER PO) Take 1 tablet by mouth at bedtime.    ? pantoprazole (PROTONIX) 40 MG tablet Take 40 mg by mouth daily.  4  ? potassium chloride SA (K-DUR,KLOR-CON) 20 MEQ tablet Take 20 mEq by mouth daily.    ?  torsemide (DEMADEX) 20 MG tablet TAKE THREE TABLETS BY MOUTH TWICE DAILY 180 tablet 11  ? torsemide (DEMADEX) 20 MG tablet Take 1 tablet (20 mg total) by mouth 2 (two) times daily. Take in addition to bubble packed 60 mg twice daily to equal 80 mg daily 60 tablet 3  ? Vitamin D, Ergocalciferol, (DRISDOL) 50000 UNITS CAPS Take 50,000 Units by mouth every Sunday.    ? ?No current facility-administered medications for this visit.  ? ?Allergies:  Statins and Penicillins  ? ?ROS: No syncope. ? ?Physical Exam: ?VS:  BP (!) 158/62   Pulse 63   Ht 5\' 2"  (1.575 m)   Wt 244 lb 3.2 oz (110.8 kg)   LMP  (LMP Unknown)   SpO2 93% Comment: 2 l/min 24/7  BMI 44.66 kg/m? , BMI Body mass index is 44.66 kg/m?. ? ?Wt Readings from Last 3 Encounters:  ?10/11/21 244 lb 3.2 oz (110.8 kg)  ?07/23/21 238 lb (108 kg)   ?07/10/21 233 lb 12.8 oz (106.1 kg)  ?  ?General: Patient appears comfortable at rest. ?HEENT: Conjunctiva and lids normal. ?Neck: Supple, no elevated JVP or carotid bruits, no thyromegaly. ?Lungs: Clear to auscultation, nonlabored breathing at rest. ?Cardiac: Regular rate and rhythm, no S3 or significant systolic murmur, no pericardial rub. ?Extremities: Increased leg edema, 2-3+. ? ?ECG:  An ECG dated 04/19/2021 was personally reviewed today and demonstrated:  Sinus bradycardia with PAC, right bundle branch block. ? ?Recent Labwork: ?10/24/2020: ALT 13; AST 23; B Natriuretic Peptide 276.0; BUN 43; Creatinine, Ser 1.66; Hemoglobin 12.9; Platelets 158; Potassium 4.0; Sodium 141  ?January 2023: Potassium 4.5, BUN 37, creatinine 1.55 ? ?Other Studies Reviewed Today: ? ?Echocardiogram 06/28/2020: ? 1. Left ventricular ejection fraction, by estimation, is 60 to 65%. The  ?left ventricle has normal function. The left ventricle has no regional  ?wall motion abnormalities. Left ventricular diastolic parameters are  ?indeterminate.  ? 2. Right ventricular systolic function is normal. The right ventricular  ?size is normal.  ? 3. Left atrial size was moderately dilated.  ? 4. Right atrial size was moderately dilated.  ? 5. The mitral valve is normal in structure. No evidence of mitral valve  ?regurgitation. No evidence of mitral stenosis.  ? 6. 21 mm Edwards Magna pericardial valve is in the AV position. . The  ?aortic valve has been repaired/replaced. Aortic valve regurgitation is not  ?visualized. No aortic stenosis is present.  ? ?Assessment and Plan: ? ?1.  HFpEF with acute on chronic diastolic heart failure, 6 pound weight gain and increased leg swelling despite consistent use of diuretics.  Plan to increase Demadex to 80 mg twice daily, continue current potassium supplement and check BMET.  Addition of metolazone was reasonable, however did not seem to improve symptoms initially.  We will decide whether this could be  added as a standing dose a few days a week. ? ?2.  History of aortic stenosis status post bioprosthetic AVR with normal prosthetic function and mean gradient 10 mmHg by last echocardiogram. ? ?3.  Persistent atrial fibrillation/flutter with CHA2DS2-VASc score of 5.  Heart rate controlled on Toprol-XL and she continues on Eliquis for stroke prophylaxis. ? ?Medication Adjustments/Labs and Tests Ordered: ?Current medicines are reviewed at length with the patient today.  Concerns regarding medicines are outlined above.  ? ?Tests Ordered: ?Orders Placed This Encounter  ?Procedures  ? Basic metabolic panel  ? ? ?Medication Changes: ?Meds ordered this encounter  ?Medications  ? torsemide (DEMADEX) 20 MG tablet  ?  Sig: Take 1 tablet (20 mg total) by mouth 2 (two) times daily. Take in addition to bubble packed 60 mg twice daily to equal 80 mg daily  ?  Dispense:  60 tablet  ?  Refill:  3  ?  10/11/2021 dose increase to 80 mg twice daily (temp)  ? ? ?Disposition:  Follow up  1 month. ? ?Signed, ?Satira Sark, MD, [facility] ?10/11/2021 10:49 AM    ?[place] [facility] ?[place], [place],  [address] ?Phone: [phone]; Fax: [phone]  ?

## 2021-10-11 ENCOUNTER — Other Ambulatory Visit: Payer: Self-pay | Admitting: *Deleted

## 2021-10-11 ENCOUNTER — Encounter: Payer: Self-pay | Admitting: Cardiology

## 2021-10-11 ENCOUNTER — Ambulatory Visit: Payer: Medicare PPO | Admitting: Cardiology

## 2021-10-11 VITALS — BP 158/62 | HR 63 | Ht 62.0 in | Wt 244.2 lb

## 2021-10-11 DIAGNOSIS — Z953 Presence of xenogenic heart valve: Secondary | ICD-10-CM | POA: Diagnosis not present

## 2021-10-11 DIAGNOSIS — Z79899 Other long term (current) drug therapy: Secondary | ICD-10-CM

## 2021-10-11 DIAGNOSIS — I25119 Atherosclerotic heart disease of native coronary artery with unspecified angina pectoris: Secondary | ICD-10-CM

## 2021-10-11 DIAGNOSIS — I5032 Chronic diastolic (congestive) heart failure: Secondary | ICD-10-CM | POA: Diagnosis not present

## 2021-10-11 MED ORDER — TORSEMIDE 20 MG PO TABS: 20.0000 mg | ORAL_TABLET | Freq: Two times a day (BID) | ORAL | 3 refills | Status: DC

## 2021-10-11 NOTE — Patient Instructions (Addendum)
Medication Instructions:  ?Your physician has recommended you make the following change in your medication:  ?Increase torsemide to 80 mg twice daily. Please take an additional 20 mg twice daily with your bubble packed 60 mg twice daily ?Continue other medications the same ? ?Labwork: ?BMET today at [facility] ? ?Testing/Procedures: ?none ? ?Follow-Up: ?Your physician recommends that you schedule a follow-up appointment in: 1 month ? ?Any Other Special Instructions Will Be Listed Below (If Applicable). ? ?If you need a refill on your cardiac medications before your next appointment, please call your pharmacy. ?

## 2021-10-17 ENCOUNTER — Telehealth: Payer: Self-pay | Admitting: Cardiology

## 2021-10-17 NOTE — Telephone Encounter (Signed)
Patient called to say that she have lost 3.6lbs. and want to know  what she should do.  ?

## 2021-10-17 NOTE — Telephone Encounter (Signed)
Patient informed and verbalized understanding of plan. 

## 2021-10-17 NOTE — Telephone Encounter (Signed)
Reports swelling continues but not as much as before ?Unable to give weight today but says she lost total of 3.6 lbs since last week. ?Confirmed that she takes torsemide 80 mg twice daily ?

## 2021-11-03 DIAGNOSIS — I5020 Unspecified systolic (congestive) heart failure: Secondary | ICD-10-CM | POA: Diagnosis not present

## 2021-11-03 DIAGNOSIS — R0602 Shortness of breath: Secondary | ICD-10-CM | POA: Diagnosis not present

## 2021-11-21 ENCOUNTER — Ambulatory Visit (INDEPENDENT_AMBULATORY_CARE_PROVIDER_SITE_OTHER): Payer: Medicare PPO | Admitting: Cardiology

## 2021-11-21 ENCOUNTER — Encounter: Payer: Self-pay | Admitting: Cardiology

## 2021-11-21 VITALS — BP 124/68 | HR 66 | Ht 63.0 in | Wt 249.2 lb

## 2021-11-21 DIAGNOSIS — Z79899 Other long term (current) drug therapy: Secondary | ICD-10-CM

## 2021-11-21 DIAGNOSIS — Z953 Presence of xenogenic heart valve: Secondary | ICD-10-CM | POA: Diagnosis not present

## 2021-11-21 DIAGNOSIS — I5032 Chronic diastolic (congestive) heart failure: Secondary | ICD-10-CM

## 2021-11-21 DIAGNOSIS — I4819 Other persistent atrial fibrillation: Secondary | ICD-10-CM | POA: Diagnosis not present

## 2021-11-21 MED ORDER — METOLAZONE 5 MG PO TABS: 5.0000 mg | ORAL_TABLET | ORAL | 2 refills | Status: DC

## 2021-11-21 NOTE — Progress Notes (Signed)
Cardiology Office Note  Date: 11/21/2021   ID: [name], DOB 25-Mar-1941, MRN [id number]  PCP:  Ignatius Specking, MD  Cardiologist:  [name], MD Electrophysiologist:  None   Chief Complaint  Patient presents with   Cardiac follow-up    History of Present Illness: [name] is an 81 y.o. female last seen in April.  She is here for a follow-up visit.  Unfortunately, has not lost a significant amount of fluid weight despite up titration of Demadex to 80 mg twice daily with potassium supplement at the last visit.  Lab work showed relatively stable renal insufficiency with creatinine 1.68 and potassium 4.5.  We discussed reducing salt intake in her diet as well as excess fluids.  Plan to update her echocardiogram to ensure no change in LVEF, but suspect that this is still HFpEF and that her aortic prosthesis is functioning normally.  We will add metolazone to her Demadex with follow-up BMET and clinical reassessment.  I have spoken with her about the possibility of requiring hospitalization for IV diuresis.  Past Medical History:  Diagnosis Date   Aortic stenosis    Arthritis    Atrial fibrillation (HCC)    Coronary atherosclerosis of native coronary artery    Multivessel   Essential hypertension    Gout    Hypothyroidism    Mixed hyperlipidemia    Psoriasis     Past Surgical History:  Procedure Laterality Date   AORTIC VALVE REPLACEMENT  2009   21 mm Edwards Magna pericardial valve   CARDIOVERSION N/A 10/20/2020   Procedure: CARDIOVERSION;  Surgeon: [name], MD;  Location: [facility];  Service: Cardiovascular;  Laterality: N/A;   CHOLECYSTECTOMY     2011   CORONARY ARTERY BYPASS GRAFT  2009   LIMA to LAD, SVG to RCA   JOINT REPLACEMENT  2012   L TOTAL KNEE   TOTAL KNEE ARTHROPLASTY  09/16/2011   Procedure: TOTAL KNEE ARTHROPLASTY;  Surgeon: Loanne Drilling, MD;  Location: [facility];  Service: Orthopedics;  Laterality: Right;   TOTAL THYROIDECTOMY  1970     Current Outpatient Medications  Medication Sig Dispense Refill   acetaminophen (TYLENOL) 650 MG CR tablet Take 1,300 mg by mouth daily.     allopurinol (ZYLOPRIM) 300 MG tablet Take 300 mg by mouth daily.  3   apixaban (ELIQUIS) 2.5 MG TABS tablet Take 1 tablet (2.5 mg total) by mouth 2 (two) times daily. 60 tablet 5   atorvastatin (LIPITOR) 10 MG tablet Take 20 mg by mouth in the morning and at bedtime.     docusate sodium (COLACE) 100 MG capsule Take 100 mg by mouth at bedtime.     fenofibrate (TRICOR) 145 MG tablet Take 145 mg by mouth daily.     ferrous sulfate 325 (65 FE) MG EC tablet Take 325 mg by mouth at bedtime.     levothyroxine (SYNTHROID, LEVOTHROID) 125 MCG tablet Take 125 mcg by mouth daily before breakfast.     losartan (COZAAR) 25 MG tablet TAKE 1 TABLET BY MOUTH DAILY 90 tablet 3   metolazone (ZAROXOLYN) 5 MG tablet Take 1 tablet (5 mg total) by mouth 3 (three) times a week. Monday-Wednesday-Friday 12 tablet 2   metoprolol succinate (TOPROL-XL) 25 MG 24 hr tablet TAKE 1/2 TABLET BY MOUTH DAILY 45 tablet 3   Multiple Vitamins-Minerals (CENTRUM SILVER PO) Take 1 tablet by mouth at bedtime.     pantoprazole (PROTONIX) 40 MG tablet Take 40  mg by mouth daily.  4   potassium chloride SA (K-DUR,KLOR-CON) 20 MEQ tablet Take 20 mEq by mouth daily.     torsemide (DEMADEX) 20 MG tablet Take 80 mg by mouth 2 (two) times daily.     Vitamin D, Ergocalciferol, (DRISDOL) 50000 UNITS CAPS Take 50,000 Units by mouth every Sunday.     No current facility-administered medications for this visit.   Allergies:  Statins and Penicillins   ROS: No palpitations or syncope.  Wearing supplemental oxygen.  Physical Exam: VS:  BP 124/68   Pulse 66   Ht 5\' 3"  (1.6 m)   Wt 249 lb 3.2 oz (113 kg)   LMP  (LMP Unknown)   SpO2 94% Comment: currently on 2 liters of oxygen  BMI 44.14 kg/m , BMI Body mass index is 44.14 kg/m.  Wt Readings from Last 3 Encounters:  11/21/21 249 lb 3.2 oz (113 kg)   10/11/21 244 lb 3.2 oz (110.8 kg)  07/23/21 238 lb (108 kg)    General: Patient appears comfortable at rest.  Wearing supplemental oxygen via nasal cannula. HEENT: Conjunctiva and lids normal. Neck: Supple, no elevated JVP or carotid bruits. Lungs: Clear to auscultation, nonlabored breathing at rest. Cardiac: Regular rate and rhythm, no S3, 2/6 systolic murmur. Abdomen: Soft, nontender, no hepatomegaly, bowel sounds present, no guarding or rebound. Extremities: 2-3+ leg edema.  ECG:  An ECG dated 04/19/2021 was personally reviewed today and demonstrated:  Sinus bradycardia with PAC, right bundle branch block.  Recent Labwork:  April 2023: Potassium 4.5, BUN 41, creatinine 1.68  Other Studies Reviewed Today:  Echocardiogram 06/28/2020:  1. Left ventricular ejection fraction, by estimation, is 60 to 65%. The  left ventricle has normal function. The left ventricle has no regional  wall motion abnormalities. Left ventricular diastolic parameters are  indeterminate.   2. Right ventricular systolic function is normal. The right ventricular  size is normal.   3. Left atrial size was moderately dilated.   4. Right atrial size was moderately dilated.   5. The mitral valve is normal in structure. No evidence of mitral valve  regurgitation. No evidence of mitral stenosis.   6. 21 mm Edwards Magna pericardial valve is in the AV position. . The  aortic valve has been repaired/replaced. Aortic valve regurgitation is not  visualized. No aortic stenosis is present.   Assessment and Plan:  1.  HFpEF with acute on chronic exacerbation, not yet responding to escalating diuretics.  Continue Demadex 80 mg twice daily, add metolazone 5 mg on Mondays, Wednesdays, and Fridays.  Continue potassium supplement with recheck BMET in 2 weeks.  Plan to get clinical reassessment at that time.  I have mentioned to her that she may require hospitalization for IV diuresis ultimately.  Echocardiogram will be updated  to ensure no change in LVEF since January 2022.  2.  Persistent atrial fibrillation/flutter with CHA2DS2-VASc score of 5.  She is symptomatically stable with good heart rate control on Toprol-XL and remains on Eliquis for stroke prophylaxis.  3.  Aortic stenosis status post bioprosthetic AVR.  Echocardiogram from last year showed grossly normal function with mean gradient 10 mmHg.  Medication Adjustments/Labs and Tests Ordered: Current medicines are reviewed at length with the patient today.  Concerns regarding medicines are outlined above.   Tests Ordered: Orders Placed This Encounter  Procedures   Basic metabolic panel   ECHOCARDIOGRAM COMPLETE    Medication Changes: Meds ordered this encounter  Medications   metolazone (ZAROXOLYN) 5 MG  tablet    Sig: Take 1 tablet (5 mg total) by mouth 3 (three) times a week. Monday-Wednesday-Friday    Dispense:  12 tablet    Refill:  2    Disposition:  Follow up  2 weeks.  Signed, [name], MD, [facility] 11/21/2021 3:13 PM    [facility] at [facility] [address] [place], [place], [place] [address] Phone: [phone]; Fax: [phone]

## 2021-11-21 NOTE — Patient Instructions (Addendum)
Medication Instructions:  Your physician has recommended you make the following change in your medication:  Start metolazone 5 mg three times weekly on Monday-Wednesday & Friday Continue other medications the same  Labwork: BMET in 2 weeks 12/06/2021 [facility] [place] [facility] Non-fasting No appointment needed  Testing/Procedures: Your physician has requested that you have an echocardiogram. Echocardiography is a painless test that uses sound waves to create images of your heart. It provides your doctor with information about the size and shape of your heart and how well your heart's chambers and valves are working. This procedure takes approximately one hour. There are no restrictions for this procedure.  Follow-Up: Your physician recommends that you schedule a follow-up appointment in: pending Your physician recommends that you schedule a follow-up appointment in: 2 weeks for a nurse visit  Any Other Special Instructions Will Be Listed Below (If Applicable).  If you need a refill on your cardiac medications before your next appointment, please call your pharmacy.

## 2021-11-27 ENCOUNTER — Ambulatory Visit (INDEPENDENT_AMBULATORY_CARE_PROVIDER_SITE_OTHER): Payer: Medicare PPO

## 2021-11-27 DIAGNOSIS — I5032 Chronic diastolic (congestive) heart failure: Secondary | ICD-10-CM

## 2021-11-28 LAB — ECHOCARDIOGRAM COMPLETE
AR max vel: 1.75 cm2
AV Area VTI: 2.12 cm2
AV Area mean vel: 1.66 cm2
AV Mean grad: 13 mmHg
AV Peak grad: 23.2 mmHg
Ao pk vel: 2.41 m/s
Area-P 1/2: 1.84 cm2
Calc EF: 75 %
MV M vel: 2.95 m/s
MV Peak grad: 34.8 mmHg
S' Lateral: 2.26 cm
Single Plane A2C EF: 62.3 %
Single Plane A4C EF: 83.7 %

## 2021-12-04 DIAGNOSIS — R0602 Shortness of breath: Secondary | ICD-10-CM | POA: Diagnosis not present

## 2021-12-04 DIAGNOSIS — I5020 Unspecified systolic (congestive) heart failure: Secondary | ICD-10-CM | POA: Diagnosis not present

## 2021-12-06 DIAGNOSIS — I5032 Chronic diastolic (congestive) heart failure: Secondary | ICD-10-CM | POA: Diagnosis not present

## 2021-12-07 ENCOUNTER — Ambulatory Visit: Payer: Medicare PPO | Admitting: *Deleted

## 2021-12-07 ENCOUNTER — Telehealth: Payer: Self-pay | Admitting: *Deleted

## 2021-12-07 VITALS — BP 122/60 | HR 74 | Wt 240.8 lb

## 2021-12-07 DIAGNOSIS — Z0000 Encounter for general adult medical examination without abnormal findings: Secondary | ICD-10-CM | POA: Diagnosis not present

## 2021-12-07 DIAGNOSIS — I10 Essential (primary) hypertension: Secondary | ICD-10-CM | POA: Diagnosis not present

## 2021-12-07 DIAGNOSIS — Z6841 Body Mass Index (BMI) 40.0 and over, adult: Secondary | ICD-10-CM | POA: Diagnosis not present

## 2021-12-07 DIAGNOSIS — I5032 Chronic diastolic (congestive) heart failure: Secondary | ICD-10-CM | POA: Diagnosis not present

## 2021-12-07 DIAGNOSIS — E876 Hypokalemia: Secondary | ICD-10-CM

## 2021-12-07 DIAGNOSIS — Z299 Encounter for prophylactic measures, unspecified: Secondary | ICD-10-CM | POA: Diagnosis not present

## 2021-12-07 DIAGNOSIS — Z789 Other specified health status: Secondary | ICD-10-CM | POA: Diagnosis not present

## 2021-12-07 NOTE — Telephone Encounter (Signed)
New Message:    Patient was calling to talk to the nurse Marylene Land)

## 2021-12-07 NOTE — Progress Notes (Unsigned)
Patient in office for vitals & medications review.   BP - 122/60  HR -  74 Wght - 240.8lb  Medication list updated as well.    Patient also questions when she needs to see Dr. Diona Browner again.  Last OV note says pending follow up.

## 2021-12-07 NOTE — Telephone Encounter (Signed)
Spoke with patient - medications updated in nurse visit documentation.

## 2021-12-07 NOTE — Telephone Encounter (Signed)
Patient is following up, again requesting to speak with Marylene Land. She states she was advised to call during nurse visit this morning.

## 2021-12-08 MED ORDER — POTASSIUM CHLORIDE CRYS ER 20 MEQ PO TBCR: EXTENDED_RELEASE_TABLET | ORAL | 0 refills | Status: DC

## 2021-12-08 NOTE — Progress Notes (Signed)
Received nurse visit note after hours. Weight has declined by 9 lbs since her last visit. She had a BMET on 12/06/2021 that had not resulted to Dr. Ival Bible inbox. This showed her creatinine had increased to 2.23 with K+ low at 2.9. Called and spoke to the patient today and she reports she is feeling better and weight has improved. Reviewed labs with the patient and recommended she reduce Metolazone to once weekly for now. Will send in extra K-dur to take 40 mEq for the next 4 days then recheck BMET next Thursday or Friday.   Inocencio Homes - Please follow-up with the patient on Monday and make sure she picked up the extra K-dur and enter lab orders for a repeat BMET. Would recommend follow-up with Dr. Diona Browner in the next 4-6 weeks but may need to further adjust meds in the interim pending labs results.   Signed, Ellsworth Lennox, PA-C 12/08/2021, 4:34 PM Pager: [phone]

## 2021-12-10 NOTE — Addendum Note (Signed)
Addended by: Lesle Chris on: 12/10/2021 05:22 PM   Modules accepted: Orders

## 2021-12-10 NOTE — Progress Notes (Addendum)
Patient notified and verbalized understanding.  She is currently taking the extra Potassium.  She will go to [facility] this Thursday for labs.  Order faxed now.    Will send Dr. Diona Browner message at patient's request in regards to her follow up visit.  Declines to come to [place] office and only wants to see him.

## 2021-12-13 DIAGNOSIS — I5032 Chronic diastolic (congestive) heart failure: Secondary | ICD-10-CM | POA: Diagnosis not present

## 2021-12-14 ENCOUNTER — Telehealth: Payer: Self-pay | Admitting: *Deleted

## 2021-12-14 MED ORDER — METOLAZONE 5 MG PO TABS: 5.0000 mg | ORAL_TABLET | ORAL | 6 refills | Status: DC

## 2021-12-14 MED ORDER — POTASSIUM CHLORIDE CRYS ER 20 MEQ PO TBCR: 20.0000 meq | EXTENDED_RELEASE_TABLET | Freq: Every day | ORAL | 6 refills | Status: DC

## 2022-01-03 DIAGNOSIS — I5020 Unspecified systolic (congestive) heart failure: Secondary | ICD-10-CM | POA: Diagnosis not present

## 2022-01-03 DIAGNOSIS — R0602 Shortness of breath: Secondary | ICD-10-CM | POA: Diagnosis not present

## 2022-01-26 NOTE — Progress Notes (Signed)
Cardiology Office Note  Date: 01/28/2022   ID: [name], DOB [birth year], MRN [id number]  PCP:  Ignatius Specking, MD  Cardiologist:  [name], MD Electrophysiologist:  None   Chief Complaint  Patient presents with   Cardiac follow-up    History of Present Illness: [name] is an 81 y.o. female last seen in May.  She is here for a follow-up visit.  Weight has come down nearly 8 pounds on current diuretic regimen which was adjusted in the interim in the setting of renal insufficiency.  Heart rate elevated after rushing and to use the bathroom this morning with ECG showing atrial flutter and 2:1 block with right bundle branch block.  After several minutes heart rate came down into the 80s to 90s.  Follow-up echocardiogram in June revealed LVEF 70 to 75% with moderate LVH and resting LVOT gradient of 46 mmHg, stable aortic prosthesis.  We went over her medications today.  I discussed addition of SGLT2 inhibitor depending on insurance coverage.  This would allow possibly further reduction in her Demadex dosing.  Also concurrent up titration of Toprol-XL and reduction in Cozaar.  Past Medical History:  Diagnosis Date   Aortic stenosis    Arthritis    Atrial fibrillation (HCC)    Coronary atherosclerosis of native coronary artery    Multivessel   Essential hypertension    Gout    Hypothyroidism    Mixed hyperlipidemia    Psoriasis     Past Surgical History:  Procedure Laterality Date   AORTIC VALVE REPLACEMENT  2009   21 mm Edwards Magna pericardial valve   CARDIOVERSION N/A 10/20/2020   Procedure: CARDIOVERSION;  Surgeon: [name], MD;  Location: [facility];  Service: Cardiovascular;  Laterality: N/A;   CHOLECYSTECTOMY     2011   CORONARY ARTERY BYPASS GRAFT  2009   LIMA to LAD, SVG to RCA   JOINT REPLACEMENT  2012   L TOTAL KNEE   TOTAL KNEE ARTHROPLASTY  09/16/2011   Procedure: TOTAL KNEE ARTHROPLASTY;  Surgeon: Loanne Drilling, MD;  Location: [facility];   Service: Orthopedics;  Laterality: Right;   TOTAL THYROIDECTOMY  1970    Current Outpatient Medications  Medication Sig Dispense Refill   acetaminophen (TYLENOL) 650 MG CR tablet Take 1,300 mg by mouth daily.     allopurinol (ZYLOPRIM) 300 MG tablet Take 300 mg by mouth daily.  3   apixaban (ELIQUIS) 2.5 MG TABS tablet Take 1 tablet (2.5 mg total) by mouth 2 (two) times daily. 60 tablet 5   atorvastatin (LIPITOR) 20 MG tablet Take 20 mg by mouth daily.     docusate sodium (COLACE) 100 MG capsule Take 100 mg by mouth at bedtime.     fenofibrate (TRICOR) 145 MG tablet Take 145 mg by mouth daily.     ferrous sulfate 325 (65 FE) MG EC tablet Take 325 mg by mouth at bedtime.     levothyroxine (SYNTHROID, LEVOTHROID) 125 MCG tablet Take 125 mcg by mouth daily before breakfast.     metolazone (ZAROXOLYN) 5 MG tablet Take 1 tablet (5 mg total) by mouth once a week. Wednesday-take 1 extra potassium pill with it 4 tablet 6   Multiple Vitamins-Minerals (CENTRUM SILVER PO) Take 1 tablet by mouth at bedtime.     pantoprazole (PROTONIX) 40 MG tablet Take 40 mg by mouth daily.  4   potassium chloride SA (KLOR-CON M) 20 MEQ tablet Take 1 tablet (20 mEq total)  by mouth daily. Take 1 extra pill on Wednesday with metolazone 30 tablet 6   torsemide (DEMADEX) 20 MG tablet Take 80 mg by mouth 2 (two) times daily.     Vitamin D, Ergocalciferol, (DRISDOL) 50000 UNITS CAPS Take 50,000 Units by mouth every Sunday.     losartan (COZAAR) 25 MG tablet Take 0.5 tablets (12.5 mg total) by mouth daily. 15 tablet 6   metoprolol succinate (TOPROL-XL) 25 MG 24 hr tablet Take 1 tablet (25 mg total) by mouth daily. 30 tablet 6   No current facility-administered medications for this visit.   Allergies:  Statins and Penicillins   ROS:  Continues with supplemental oxygen via nasal cannula.  Physical Exam: VS:  BP (!) 100/58   Pulse (!) 136   Ht 5\' 2"  (1.575 m)   Wt 242 lb 3.2 oz (109.9 kg)   LMP  (LMP Unknown)   SpO2 96%    BMI 44.30 kg/m , BMI Body mass index is 44.3 kg/m.  Wt Readings from Last 3 Encounters:  01/28/22 242 lb 3.2 oz (109.9 kg)  12/07/21 240 lb 12.8 oz (109.2 kg)  11/21/21 249 lb 3.2 oz (113 kg)    General: Patient appears comfortable at rest. HEENT: Conjunctiva and lids normal, oropharynx clear. Neck: Supple, no elevated JVP or carotid bruits, no thyromegaly. Lungs: Clear to auscultation, nonlabored breathing at rest. Cardiac: Irregular irregular, no S3, 2/6 systolic murmur, no pericardial rub. Extremities: 1-2+ leg edema.  ECG:  An ECG dated 04/19/2021 was personally reviewed today and demonstrated:  Sinus bradycardia with PAC, left bundle branch block.  Recent Labwork:  June 2023: Potassium 4.4, BUN 57, creatinine 1.96  Other Studies Reviewed Today:  Echocardiogram 11/27/2021:  1. There is a dynamic LVOT gradient created by hyperdynamic LV function  and moderate symmetrical LVH. Resting peak gradient 46 mmHg. Valsalva was  not performed. . Left ventricular ejection fraction, by estimation, is 70  to 75%. The left ventricle has  hyperdynamic function. Left ventricular endocardial border not optimally  defined to evaluate regional wall motion. There is moderate left  ventricular hypertrophy. Left ventricular diastolic parameters are  indeterminate. Elevated left atrial pressure.   2. Right ventricular systolic function is normal. The right ventricular  size is normal. Tricuspid regurgitation signal is inadequate for assessing  PA pressure.   3. Left atrial size was moderately dilated.   4. The mitral valve is abnormal. Mild mitral valve regurgitation. Mild  mitral stenosis.   5. 21 mm Edwards Magna pericardial valve is in the AV position with  normal function. The aortic valve has been repaired/replaced. There is  mild calcification of the aortic valve. There is mild thickening of the  aortic valve. Aortic valve regurgitation  is not visualized. No aortic stenosis is  present.   6. The inferior vena cava is normal in size with greater than 50%  respiratory variability, suggesting right atrial pressure of 3 mmHg.   Assessment and Plan:  1.  HFpEF, LVEF 70 to 75% with moderate LVH and relatively mild LVOT gradient at rest.  Fluid status improved on current diuretic regimen with weight loss of approximately 8 pounds since May.  Follow-up lab work shows creatinine 1.96 down from 2.23.  Potassium also normal at 4.4.  Plan to initiate SGLT2 inhibitor with decrease in Demadex to 60 mg twice daily, continue once weekly, metolazone.  2.  Persistent atrial fibrillation/flutter with CHA2DS2-VASc score of 5.  Increase Toprol-XL to 25 mg daily and continue Eliquis.  3.  CKD stage IIIb, creatinine 1.96.  4.  Aortic stenosis status post bioprosthetic AVR, 21 mm Edwards Magna pericardial prosthesis in position by recent echocardiogram.  Medication Adjustments/Labs and Tests Ordered: Current medicines are reviewed at length with the patient today.  Concerns regarding medicines are outlined above.   Tests Ordered: Orders Placed This Encounter  Procedures   EKG 12-Lead    Medication Changes: Meds ordered this encounter  Medications   DISCONTD: metoprolol succinate (TOPROL-XL) 25 MG 24 hr tablet    Sig: Take 1 tablet (25 mg total) by mouth daily.    Dispense:  90 tablet    Refill:  1    01/28/22 Dose Increase   DISCONTD: losartan (COZAAR) 25 MG tablet    Sig: Take 0.5 tablets (12.5 mg total) by mouth daily.    Dispense:  45 tablet    Refill:  1    01/28/22 Dose Decrease   losartan (COZAAR) 25 MG tablet    Sig: Take 0.5 tablets (12.5 mg total) by mouth daily.    Dispense:  15 tablet    Refill:  6    01/28/22 Dose Decrease Patients medications are prepackaged   metoprolol succinate (TOPROL-XL) 25 MG 24 hr tablet    Sig: Take 1 tablet (25 mg total) by mouth daily.    Dispense:  30 tablet    Refill:  6    01/28/22 Dose Increase Patients medications are prepackaged     Disposition:  Follow up  6 to 8 weeks.  Signed, [name], MD, [facility] 01/28/2022 11:26 AM    [facility] at [facility] [address] [place], [place], [place] [address] Phone: [phone]; Fax: [phone]

## 2022-01-28 ENCOUNTER — Ambulatory Visit (INDEPENDENT_AMBULATORY_CARE_PROVIDER_SITE_OTHER): Payer: Medicare PPO | Admitting: Cardiology

## 2022-01-28 ENCOUNTER — Telehealth: Payer: Self-pay

## 2022-01-28 ENCOUNTER — Encounter: Payer: Self-pay | Admitting: Cardiology

## 2022-01-28 VITALS — BP 100/58 | HR 136 | Ht 62.0 in | Wt 242.2 lb

## 2022-01-28 DIAGNOSIS — I4819 Other persistent atrial fibrillation: Secondary | ICD-10-CM | POA: Diagnosis not present

## 2022-01-28 DIAGNOSIS — I5032 Chronic diastolic (congestive) heart failure: Secondary | ICD-10-CM | POA: Diagnosis not present

## 2022-01-28 DIAGNOSIS — N1832 Chronic kidney disease, stage 3b: Secondary | ICD-10-CM

## 2022-01-28 MED ORDER — EMPAGLIFLOZIN 10 MG PO TABS: 10.0000 mg | ORAL_TABLET | Freq: Every day | ORAL | 6 refills | Status: DC

## 2022-01-28 MED ORDER — LOSARTAN POTASSIUM 25 MG PO TABS: 12.5000 mg | ORAL_TABLET | Freq: Every day | ORAL | 6 refills | Status: DC

## 2022-01-28 MED ORDER — METOPROLOL SUCCINATE ER 25 MG PO TB24: 25.0000 mg | ORAL_TABLET | Freq: Every day | ORAL | 6 refills | Status: DC

## 2022-01-28 MED ORDER — METOPROLOL SUCCINATE ER 25 MG PO TB24: 25.0000 mg | ORAL_TABLET | Freq: Every day | ORAL | 1 refills | Status: DC

## 2022-01-28 MED ORDER — LOSARTAN POTASSIUM 25 MG PO TABS: 12.5000 mg | ORAL_TABLET | Freq: Every day | ORAL | 1 refills | Status: DC

## 2022-01-28 NOTE — Telephone Encounter (Signed)
Per PharmD: [name] is the preferred. It would be $80/90 days or $40/30 days. No PA is needed. This pricing is if the patient is NOT in the coverage gap.   Patient informed and states that she will be able to afford this medication and request that it be sent to [facility].  Jardiance 10 mg sent to [facility] per patient request.

## 2022-01-28 NOTE — Patient Instructions (Addendum)
Medication Instructions:  Your physician has recommended you make the following change in your medication:  Increase metoprolol to 25 mg once a day Decrease losartan to 12.5 mg once a day Continue all other medications as directed  Labwork: none  Testing/Procedures: none  Follow-Up:  Your physician recommends that you schedule a follow-up appointment in: 6-8 weeks  Any Other Special Instructions Will Be Listed Below (If Applicable).  We will contact you regarding the start of jardiance or farxiga.  If you need a refill on your cardiac medications before your next appointment, please call your pharmacy.

## 2022-01-28 NOTE — Telephone Encounter (Deleted)
-----   Message from Melissa D Maccia, RPH-CPP sent at 01/28/2022  2:55 PM EDT ----- Jardiance is the preferred. It would be $80/90 days or $40/30 days. No PA is needed. This pricing is if the patient is NOT in the coverage gap. ----- Message ----- From: Maddy Graham M, CMA Sent: 01/28/2022  11:09 AM EDT To: [facility] Pharm[facility]  Is there any way of seeing  if this patients insurance will cover Jardiance 10 mg daily or farxiga 10 mg daily and also what her copay would be?   

## 2022-01-28 NOTE — Telephone Encounter (Signed)
-----   Message from Olene Floss, RPH-CPP sent at 01/28/2022  2:55 PM EDT ----- [name] is the preferred. It would be $80/90 days or $40/30 days. No PA is needed. This pricing is if the patient is NOT in the coverage gap. ----- Message ----- From: Delsa Sale, CMA Sent: 01/28/2022  11:09 AM EDT To: [facility]  Is there any way of seeing  if this patients insurance will cover Jardiance 10 mg daily or farxiga 10 mg daily and also what her copay would be?

## 2022-01-28 NOTE — Addendum Note (Signed)
Addended by: Delsa Sale on: 01/28/2022 11:39 AM   Modules accepted: Orders

## 2022-01-28 NOTE — Telephone Encounter (Signed)
Dicussed with patient in office and over the phone and made sure she understood to decrease Demadex to 60 bid once she starts taking the jardiance 10 mg. Patient verbalized understanding.

## 2022-01-31 ENCOUNTER — Other Ambulatory Visit: Payer: Self-pay | Admitting: Cardiology

## 2022-01-31 NOTE — Telephone Encounter (Signed)
Prescription refill request for Eliquis received. Indication: Atrial Fib Last office visit: 01/28/22  Ival Bible MD Scr: 1.96 on 12/13/21 Age: 81 Weight: 109.9kg  Based on above findings Eliquis 2.5mg  twice daily is the appropriate dose.  Refill approved.

## 2022-02-03 DIAGNOSIS — R0602 Shortness of breath: Secondary | ICD-10-CM | POA: Diagnosis not present

## 2022-02-03 DIAGNOSIS — I5020 Unspecified systolic (congestive) heart failure: Secondary | ICD-10-CM | POA: Diagnosis not present

## 2022-02-07 ENCOUNTER — Telehealth: Payer: Self-pay | Admitting: Cardiology

## 2022-02-07 MED ORDER — POTASSIUM CHLORIDE CRYS ER 20 MEQ PO TBCR: 20.0000 meq | EXTENDED_RELEASE_TABLET | Freq: Every day | ORAL | 6 refills | Status: DC

## 2022-02-07 NOTE — Telephone Encounter (Signed)
Pt c/o medication issue:  1. Name of Medication: potassium chloride SA (KLOR-CON M) 20 MEQ tablet  2. How are you currently taking this medication (dosage and times per day)? 1 tablet daily, 1 extra of Wednesday   3. Are you having a reaction (difficulty breathing--STAT)? no  4. What is your medication issue? Kirbi with [facility] says the quantity of the prescription is only for 30 days. She says she packages the patient's prescriptions and needs it to have a quantity of 35 so she can package it. She requests a call back:  [phone]

## 2022-02-07 NOTE — Telephone Encounter (Signed)
Contacted Aunesty at [facility] and advised that its okay to change quantity to #35 for potassium.

## 2022-03-06 DIAGNOSIS — I5020 Unspecified systolic (congestive) heart failure: Secondary | ICD-10-CM | POA: Diagnosis not present

## 2022-03-06 DIAGNOSIS — R0602 Shortness of breath: Secondary | ICD-10-CM | POA: Diagnosis not present

## 2022-03-12 ENCOUNTER — Ambulatory Visit: Payer: Medicare PPO | Attending: Cardiology | Admitting: Cardiology

## 2022-03-12 ENCOUNTER — Encounter: Payer: Self-pay | Admitting: Cardiology

## 2022-03-12 VITALS — BP 122/72 | HR 64 | Ht 63.0 in | Wt 242.6 lb

## 2022-03-12 DIAGNOSIS — Z79899 Other long term (current) drug therapy: Secondary | ICD-10-CM

## 2022-03-12 DIAGNOSIS — N1832 Chronic kidney disease, stage 3b: Secondary | ICD-10-CM

## 2022-03-12 DIAGNOSIS — I5032 Chronic diastolic (congestive) heart failure: Secondary | ICD-10-CM

## 2022-03-12 DIAGNOSIS — I4819 Other persistent atrial fibrillation: Secondary | ICD-10-CM

## 2022-03-12 DIAGNOSIS — Z953 Presence of xenogenic heart valve: Secondary | ICD-10-CM | POA: Diagnosis not present

## 2022-03-12 MED ORDER — EMPAGLIFLOZIN 10 MG PO TABS: 10.0000 mg | ORAL_TABLET | Freq: Every day | ORAL | 6 refills | Status: DC

## 2022-03-12 MED ORDER — TORSEMIDE 20 MG PO TABS: 60.0000 mg | ORAL_TABLET | Freq: Two times a day (BID) | ORAL | 6 refills | Status: DC

## 2022-03-12 NOTE — Patient Instructions (Addendum)
Medication Instructions:  Your physician has recommended you make the following change in your medication:  Decrease torsemide to 60 mg twice daily Please start Jardiance 10 mg daily Continue other medications the same  Labwork: BMET in 6 weeks before next vist (November 2023) [facility] Non-fasting  Testing/Procedures: none  Follow-Up: Your physician recommends that you schedule a follow-up appointment in: 6 weeks  Any Other Special Instructions Will Be Listed Below (If Applicable).  If you need a refill on your cardiac medications before your next appointment, please call your pharmacy.

## 2022-03-12 NOTE — Progress Notes (Signed)
Cardiology Office Note  Date: 03/12/2022   ID: [name], DOB 1940-08-24, MRN [id number]  PCP:  April Chroman, MD  Cardiologist:  April Lesches, MD Electrophysiologist:  None   Chief Complaint  Patient presents with   Cardiac follow-up    History of Present Illness: [name] is an 81 y.o. female last seen in August.  She is here for a follow-up visit.  Reports reasonable control of fluid and her weight is stable.  At the last visit we uptitrated Toprol-XL and down titrated Cozaar with plan to start SGLT2 inhibitor.  She was to start on Jardiance although it does not appear that this was ever sent to her pharmacy.  We discussed initiating Jardiance 10 mg daily with reduction in Demadex to 60 mg twice daily for now.   I personally reviewed her ECG today which shows atrial fibrillation/flutter with variable conduction and intermittent PVCs, right bundle branch block.  Lab work in June showed creatinine 1.96 and potassium 4.4.  Past Medical History:  Diagnosis Date   Aortic stenosis    Arthritis    Atrial fibrillation ([place])    Coronary atherosclerosis of native coronary artery    Multivessel   Essential hypertension    Gout    Hypothyroidism    Mixed hyperlipidemia    Psoriasis     Past Surgical History:  Procedure Laterality Date   AORTIC VALVE REPLACEMENT  2009   21 mm Edwards Magna pericardial valve   CARDIOVERSION N/A 10/20/2020   Procedure: CARDIOVERSION;  Surgeon: April Sark, MD;  Location: [facility];  Service: Cardiovascular;  Laterality: N/A;   CHOLECYSTECTOMY     2011   CORONARY ARTERY BYPASS GRAFT  2009   LIMA to LAD, SVG to RCA   JOINT REPLACEMENT  2012   L TOTAL KNEE   TOTAL KNEE ARTHROPLASTY  09/16/2011   Procedure: TOTAL KNEE ARTHROPLASTY;  Surgeon: April Alf, MD;  Location: [facility];  Service: Orthopedics;  Laterality: Right;   TOTAL THYROIDECTOMY  1970    Current Outpatient Medications  Medication Sig Dispense Refill    acetaminophen (TYLENOL) 650 MG CR tablet Take 1,300 mg by mouth daily.     allopurinol (ZYLOPRIM) 300 MG tablet Take 300 mg by mouth daily.  3   atorvastatin (LIPITOR) 20 MG tablet Take 20 mg by mouth daily.     docusate sodium (COLACE) 100 MG capsule Take 100 mg by mouth at bedtime.     ELIQUIS 2.5 MG TABS tablet TAKE 1 TABLET BY MOUTH TWICE DAILY 60 tablet 5   fenofibrate (TRICOR) 145 MG tablet Take 145 mg by mouth daily.     ferrous sulfate 325 (65 FE) MG EC tablet Take 325 mg by mouth at bedtime.     levothyroxine (SYNTHROID, LEVOTHROID) 125 MCG tablet Take 125 mcg by mouth daily before breakfast.     losartan (COZAAR) 25 MG tablet Take 0.5 tablets (12.5 mg total) by mouth daily. 15 tablet 6   metolazone (ZAROXOLYN) 5 MG tablet Take 1 tablet (5 mg total) by mouth once a week. Wednesday-take 1 extra potassium pill with it 4 tablet 6   metoprolol succinate (TOPROL-XL) 25 MG 24 hr tablet Take 1 tablet (25 mg total) by mouth daily. 30 tablet 6   Multiple Vitamins-Minerals (CENTRUM SILVER PO) Take 1 tablet by mouth at bedtime.     pantoprazole (PROTONIX) 40 MG tablet Take 40 mg by mouth daily.  4   potassium chloride SA (KLOR-CON  M) 20 MEQ tablet Take 1 tablet (20 mEq total) by mouth daily. Take 1 extra pill on Wednesday with metolazone 35 tablet 6   Vitamin D, Ergocalciferol, (DRISDOL) 50000 UNITS CAPS Take 50,000 Units by mouth every Sunday.     empagliflozin (JARDIANCE) 10 MG TABS tablet Take 1 tablet (10 mg total) by mouth daily before breakfast. 30 tablet 6   torsemide (DEMADEX) 20 MG tablet Take 3 tablets (60 mg total) by mouth 2 (two) times daily. 180 tablet 6   No current facility-administered medications for this visit.   Allergies:  Statins and Penicillins   ROS: No syncope.  Physical Exam: VS:  BP 122/72 (BP Location: Left Arm, Patient Position: Sitting, Cuff Size: Large)   Pulse 64   Ht 5\' 3"  (1.6 m)   Wt 242 lb 9.6 oz (110 kg)   LMP  (LMP Unknown)   SpO2 96% Comment:  currently on 2 liters of oxygen  BMI 42.97 kg/m , BMI Body mass index is 42.97 kg/m.  Wt Readings from Last 3 Encounters:  03/12/22 242 lb 9.6 oz (110 kg)  01/28/22 242 lb 3.2 oz (109.9 kg)  12/07/21 240 lb 12.8 oz (109.2 kg)    General: Patient appears comfortable at rest. HEENT: Conjunctiva and lids normal. Neck: Supple, no elevated JVP or carotid bruits, no thyromegaly. Lungs: Clear to auscultation, nonlabored breathing at rest. Cardiac: Irregularly irregular, no S3, 2/6 systolic murmur. Extremities: Mild leg edema, generally improved.  ECG:  An ECG dated 01/28/2022 was personally reviewed today and demonstrated:  Atrial flutter with 2:1 block.  Recent Labwork:  June 2023: Potassium 4.4, BUN 57, creatinine 1.96  Other Studies Reviewed Today:  Echocardiogram 11/27/2021:  1. There is a dynamic LVOT gradient created by hyperdynamic LV function  and moderate symmetrical LVH. Resting peak gradient 46 mmHg. Valsalva was  not performed. . Left ventricular ejection fraction, by estimation, is 70  to 75%. The left ventricle has  hyperdynamic function. Left ventricular endocardial border not optimally  defined to evaluate regional wall motion. There is moderate left  ventricular hypertrophy. Left ventricular diastolic parameters are  indeterminate. Elevated left atrial pressure.   2. Right ventricular systolic function is normal. The right ventricular  size is normal. Tricuspid regurgitation signal is inadequate for assessing  PA pressure.   3. Left atrial size was moderately dilated.   4. The mitral valve is abnormal. Mild mitral valve regurgitation. Mild  mitral stenosis.   5. 21 mm Edwards Magna pericardial valve is in the AV position with  normal function. The aortic valve has been repaired/replaced. There is  mild calcification of the aortic valve. There is mild thickening of the  aortic valve. Aortic valve regurgitation  is not visualized. No aortic stenosis is present.   6.  The inferior vena cava is normal in size with greater than 50%  respiratory variability, suggesting right atrial pressure of 3 mmHg.   Assessment and Plan:  1.  HFpEF, LVEF 70 to 75% with moderate LVH and mild LVOT gradient at rest.  Plan is to initiate Jardiance 10 mg daily with reduction in Demadex to 60 mg twice daily.  Continue potassium supplement and once weekly metolazone as before.  Follow-up BMET for next visit.  2.  Persistent atrial fibrillation/flutter with CHA2DS2-VASc score of 5.  Heart rate better controlled on current dose of Toprol-XL.  Continue Eliquis 2.5 mg twice daily for stroke prophylaxis.  3.  CKD stage IIIb, last creatinine 1.96 with normal potassium.  4.  Aortic stenosis status post bioprosthetic AVR, 21 mm Edwards magna pericardial prosthesis.  Medication Adjustments/Labs and Tests Ordered: Current medicines are reviewed at length with the patient today.  Concerns regarding medicines are outlined above.   Tests Ordered: Orders Placed This Encounter  Procedures   Basic metabolic panel   EKG [id number]    Medication Changes: Meds ordered this encounter  Medications   torsemide (DEMADEX) 20 MG tablet    Sig: Take 3 tablets (60 mg total) by mouth 2 (two) times daily.    Dispense:  180 tablet    Refill:  6    03/12/2022 dose decrease   empagliflozin (JARDIANCE) 10 MG TABS tablet    Sig: Take 1 tablet (10 mg total) by mouth daily before breakfast.    Dispense:  30 tablet    Refill:  6    Disposition:  Follow up  6 weeks.  Signed, April Sark, MD, [facility] 03/12/2022 3:25 PM    [place] [facility] [address], [place], [place] [address] Phone: [phone]; Fax: [phone]

## 2022-04-05 DIAGNOSIS — I5020 Unspecified systolic (congestive) heart failure: Secondary | ICD-10-CM | POA: Diagnosis not present

## 2022-04-05 DIAGNOSIS — R0602 Shortness of breath: Secondary | ICD-10-CM | POA: Diagnosis not present

## 2022-04-15 DIAGNOSIS — I5032 Chronic diastolic (congestive) heart failure: Secondary | ICD-10-CM | POA: Diagnosis not present

## 2022-04-18 ENCOUNTER — Ambulatory Visit: Payer: Medicare PPO | Attending: Cardiology | Admitting: Cardiology

## 2022-04-18 ENCOUNTER — Encounter: Payer: Self-pay | Admitting: Cardiology

## 2022-04-18 VITALS — BP 118/64 | HR 64 | Ht 63.0 in | Wt 240.2 lb

## 2022-04-18 DIAGNOSIS — I5032 Chronic diastolic (congestive) heart failure: Secondary | ICD-10-CM

## 2022-04-18 DIAGNOSIS — I4819 Other persistent atrial fibrillation: Secondary | ICD-10-CM

## 2022-04-18 DIAGNOSIS — N1832 Chronic kidney disease, stage 3b: Secondary | ICD-10-CM | POA: Diagnosis not present

## 2022-04-18 NOTE — Patient Instructions (Addendum)
Medication Instructions:  Your physician recommends that you continue on your current medications as directed. Please refer to the Current Medication list given to you today.  Labwork: BMET (@ [facility], 1-2 weeks before scheduled appointment with Dr.  Domenic Polite)  Testing/Procedures: none  Follow-Up:  Your physician recommends that you schedule a follow-up appointment in: 6 months  Any Other Special Instructions Will Be Listed Below (If Applicable).  If you need a refill on your cardiac medications before your next appointment, please call your pharmacy.

## 2022-04-18 NOTE — Progress Notes (Signed)
Cardiology Office Note  Date: 04/18/2022   ID: [name], DOB 10-10-1940, MRN [id number]  PCP:  Ignatius Specking, MD  Cardiologist:  [name], MD Electrophysiologist:  None   Chief Complaint  Patient presents with   Cardiac follow-up    History of Present Illness: [name] is an 81 y.o. female last seen in September.  She is here for a follow-up visit.  Feels better in terms of leg swelling, there has been nice improvement in her weight is down a few more pounds.  She has tolerated the addition of Jardiance and otherwise her medications are stable as outlined below.  Heart rate also well controlled, she reports no palpitations.  Currently with NYHA class II dyspnea with low-level activity.  Still using supplemental oxygen as before.  Past Medical History:  Diagnosis Date   Aortic stenosis    Arthritis    Atrial fibrillation (HCC)    Coronary atherosclerosis of native coronary artery    Multivessel   Essential hypertension    Gout    Hypothyroidism    Mixed hyperlipidemia    Psoriasis     Past Surgical History:  Procedure Laterality Date   AORTIC VALVE REPLACEMENT  2009   21 mm Edwards Magna pericardial valve   CARDIOVERSION N/A 10/20/2020   Procedure: CARDIOVERSION;  Surgeon: [name], MD;  Location: [facility];  Service: Cardiovascular;  Laterality: N/A;   CHOLECYSTECTOMY     2011   CORONARY ARTERY BYPASS GRAFT  2009   LIMA to LAD, SVG to RCA   JOINT REPLACEMENT  2012   L TOTAL KNEE   TOTAL KNEE ARTHROPLASTY  09/16/2011   Procedure: TOTAL KNEE ARTHROPLASTY;  Surgeon: Loanne Drilling, MD;  Location: [facility];  Service: Orthopedics;  Laterality: Right;   TOTAL THYROIDECTOMY  1970    Current Outpatient Medications  Medication Sig Dispense Refill   acetaminophen (TYLENOL) 650 MG CR tablet Take 1,300 mg by mouth daily.     allopurinol (ZYLOPRIM) 300 MG tablet Take 300 mg by mouth daily.  3   atorvastatin (LIPITOR) 20 MG tablet Take 20 mg by mouth  daily.     docusate sodium (COLACE) 100 MG capsule Take 100 mg by mouth at bedtime.     ELIQUIS 2.5 MG TABS tablet TAKE 1 TABLET BY MOUTH TWICE DAILY 60 tablet 5   empagliflozin (JARDIANCE) 10 MG TABS tablet Take 1 tablet (10 mg total) by mouth daily before breakfast. 30 tablet 6   fenofibrate (TRICOR) 145 MG tablet Take 145 mg by mouth daily.     ferrous sulfate 325 (65 FE) MG EC tablet Take 325 mg by mouth at bedtime.     levothyroxine (SYNTHROID, LEVOTHROID) 125 MCG tablet Take 125 mcg by mouth daily before breakfast.     losartan (COZAAR) 25 MG tablet Take 0.5 tablets (12.5 mg total) by mouth daily. 15 tablet 6   metolazone (ZAROXOLYN) 5 MG tablet Take 1 tablet (5 mg total) by mouth once a week. Wednesday-take 1 extra potassium pill with it 4 tablet 6   metoprolol succinate (TOPROL-XL) 25 MG 24 hr tablet Take 1 tablet (25 mg total) by mouth daily. 30 tablet 6   Multiple Vitamins-Minerals (CENTRUM SILVER PO) Take 1 tablet by mouth at bedtime.     pantoprazole (PROTONIX) 40 MG tablet Take 40 mg by mouth daily.  4   potassium chloride SA (KLOR-CON M) 20 MEQ tablet Take 1 tablet (20 mEq total) by mouth daily.  Take 1 extra pill on Wednesday with metolazone 35 tablet 6   torsemide (DEMADEX) 20 MG tablet Take 3 tablets (60 mg total) by mouth 2 (two) times daily. 180 tablet 6   Vitamin D, Ergocalciferol, (DRISDOL) 50000 UNITS CAPS Take 50,000 Units by mouth every Sunday.     No current facility-administered medications for this visit.   Allergies:  Statins and Penicillins   ROS: No orthopnea or PND.  No syncope.  Physical Exam: VS:  BP 118/64   Pulse 64   Ht 5\' 3"  (1.6 m)   Wt 240 lb 3.2 oz (109 kg)   LMP  (LMP Unknown)   SpO2 91%   BMI 42.55 kg/m , BMI Body mass index is 42.55 kg/m.  Wt Readings from Last 3 Encounters:  04/18/22 240 lb 3.2 oz (109 kg)  03/12/22 242 lb 9.6 oz (110 kg)  01/28/22 242 lb 3.2 oz (109.9 kg)    General: Patient appears comfortable at rest. HEENT:  Conjunctiva and lids normal. Neck: Supple, no elevated JVP or carotid bruits. Lungs: Clear to auscultation, nonlabored breathing at rest. Cardiac: Irregularly irregular, no S3, 2/6 systolic murmur. Extremities: Significantly improved leg edema.  ECG:  An ECG dated 03/12/2022 was personally reviewed today and demonstrated:  Atrial fibrillation/flutter with variable conduction and intermittent PVCs, right bundle branch block.  Recent Labwork:  June 2023: Potassium 4.4, BUN 57, creatinine 1.96  Other Studies Reviewed Today:  Echocardiogram 11/27/2021:  1. There is a dynamic LVOT gradient created by hyperdynamic LV function  and moderate symmetrical LVH. Resting peak gradient 46 mmHg. Valsalva was  not performed. . Left ventricular ejection fraction, by estimation, is 70  to 75%. The left ventricle has  hyperdynamic function. Left ventricular endocardial border not optimally  defined to evaluate regional wall motion. There is moderate left  ventricular hypertrophy. Left ventricular diastolic parameters are  indeterminate. Elevated left atrial pressure.   2. Right ventricular systolic function is normal. The right ventricular  size is normal. Tricuspid regurgitation signal is inadequate for assessing  PA pressure.   3. Left atrial size was moderately dilated.   4. The mitral valve is abnormal. Mild mitral valve regurgitation. Mild  mitral stenosis.   5. 21 mm Edwards Magna pericardial valve is in the AV position with  normal function. The aortic valve has been repaired/replaced. There is  mild calcification of the aortic valve. There is mild thickening of the  aortic valve. Aortic valve regurgitation  is not visualized. No aortic stenosis is present.   6. The inferior vena cava is normal in size with greater than 50%  respiratory variability, suggesting right atrial pressure of 3 mmHg.   Assessment and Plan:  1.  HFpEF, LVEF 70 to 75% with moderate LVH and mild LVOT gradient at rest.   She is clinically stable at this time with NYHA class II dyspnea, weight down a few pounds and leg swelling improved.  Continue Jardiance, Demadex with potassium supplement, and weekly metolazone.  Check BMET for next visit.  2.  Persistent atrial fibrillation/flutter with CHA2DS2-VASc score of 5.  No active palpitations and heart rate well controlled on Toprol-XL.  Continue Eliquis for stroke prophylaxis.  3.  CKD stage IIIb, follow-up creatinine 1.96.  4.  Aortic stenosis status post bioprosthetic AVR.  Medication Adjustments/Labs and Tests Ordered: Current medicines are reviewed at length with the patient today.  Concerns regarding medicines are outlined above.   Tests Ordered: Orders Placed This Encounter  Procedures   Basic metabolic  panel    Medication Changes: No orders of the defined types were placed in this encounter.   Disposition:  Follow up  6 months.  Signed, Satira Sark, MD, [facility] 04/18/2022 3:10 PM    [facility] [place], [place], [place] [address] Phone: [phone]; Fax: [phone]

## 2022-05-06 DIAGNOSIS — I5020 Unspecified systolic (congestive) heart failure: Secondary | ICD-10-CM | POA: Diagnosis not present

## 2022-05-06 DIAGNOSIS — R0602 Shortness of breath: Secondary | ICD-10-CM | POA: Diagnosis not present

## 2022-05-09 ENCOUNTER — Other Ambulatory Visit: Payer: Self-pay | Admitting: Student

## 2022-05-30 DIAGNOSIS — H43813 Vitreous degeneration, bilateral: Secondary | ICD-10-CM | POA: Diagnosis not present

## 2022-06-05 DIAGNOSIS — I5020 Unspecified systolic (congestive) heart failure: Secondary | ICD-10-CM | POA: Diagnosis not present

## 2022-06-05 DIAGNOSIS — R0602 Shortness of breath: Secondary | ICD-10-CM | POA: Diagnosis not present

## 2022-06-26 DIAGNOSIS — Z951 Presence of aortocoronary bypass graft: Secondary | ICD-10-CM | POA: Diagnosis not present

## 2022-06-26 DIAGNOSIS — R001 Bradycardia, unspecified: Secondary | ICD-10-CM | POA: Diagnosis not present

## 2022-06-26 DIAGNOSIS — I480 Paroxysmal atrial fibrillation: Secondary | ICD-10-CM | POA: Diagnosis not present

## 2022-06-26 DIAGNOSIS — I3481 Nonrheumatic mitral (valve) annulus calcification: Secondary | ICD-10-CM | POA: Diagnosis not present

## 2022-06-26 DIAGNOSIS — I10 Essential (primary) hypertension: Secondary | ICD-10-CM | POA: Diagnosis not present

## 2022-06-26 DIAGNOSIS — R778 Other specified abnormalities of plasma proteins: Secondary | ICD-10-CM | POA: Diagnosis not present

## 2022-06-26 DIAGNOSIS — J9811 Atelectasis: Secondary | ICD-10-CM | POA: Diagnosis not present

## 2022-06-26 DIAGNOSIS — R7989 Other specified abnormal findings of blood chemistry: Secondary | ICD-10-CM | POA: Diagnosis not present

## 2022-06-26 DIAGNOSIS — R069 Unspecified abnormalities of breathing: Secondary | ICD-10-CM | POA: Diagnosis not present

## 2022-06-26 DIAGNOSIS — I5189 Other ill-defined heart diseases: Secondary | ICD-10-CM | POA: Diagnosis not present

## 2022-06-26 DIAGNOSIS — Z6841 Body Mass Index (BMI) 40.0 and over, adult: Secondary | ICD-10-CM | POA: Diagnosis not present

## 2022-06-26 DIAGNOSIS — E6609 Other obesity due to excess calories: Secondary | ICD-10-CM | POA: Diagnosis not present

## 2022-06-26 DIAGNOSIS — J811 Chronic pulmonary edema: Secondary | ICD-10-CM | POA: Diagnosis not present

## 2022-06-26 DIAGNOSIS — N184 Chronic kidney disease, stage 4 (severe): Secondary | ICD-10-CM | POA: Diagnosis not present

## 2022-06-26 DIAGNOSIS — R0602 Shortness of breath: Secondary | ICD-10-CM | POA: Diagnosis not present

## 2022-06-26 DIAGNOSIS — I130 Hypertensive heart and chronic kidney disease with heart failure and stage 1 through stage 4 chronic kidney disease, or unspecified chronic kidney disease: Secondary | ICD-10-CM | POA: Diagnosis not present

## 2022-06-26 DIAGNOSIS — I517 Cardiomegaly: Secondary | ICD-10-CM | POA: Diagnosis not present

## 2022-06-26 DIAGNOSIS — I5030 Unspecified diastolic (congestive) heart failure: Secondary | ICD-10-CM | POA: Diagnosis not present

## 2022-06-26 DIAGNOSIS — I5043 Acute on chronic combined systolic (congestive) and diastolic (congestive) heart failure: Secondary | ICD-10-CM | POA: Diagnosis not present

## 2022-06-26 DIAGNOSIS — I272 Pulmonary hypertension, unspecified: Secondary | ICD-10-CM | POA: Diagnosis not present

## 2022-06-27 ENCOUNTER — Encounter (HOSPITAL_COMMUNITY): Payer: Self-pay

## 2022-06-28 DIAGNOSIS — R7989 Other specified abnormal findings of blood chemistry: Secondary | ICD-10-CM | POA: Diagnosis not present

## 2022-06-28 DIAGNOSIS — I5030 Unspecified diastolic (congestive) heart failure: Secondary | ICD-10-CM | POA: Diagnosis not present

## 2022-06-28 DIAGNOSIS — I272 Pulmonary hypertension, unspecified: Secondary | ICD-10-CM | POA: Diagnosis not present

## 2022-06-28 DIAGNOSIS — I5189 Other ill-defined heart diseases: Secondary | ICD-10-CM | POA: Diagnosis not present

## 2022-06-28 DIAGNOSIS — Z951 Presence of aortocoronary bypass graft: Secondary | ICD-10-CM | POA: Diagnosis not present

## 2022-06-28 DIAGNOSIS — I517 Cardiomegaly: Secondary | ICD-10-CM | POA: Diagnosis not present

## 2022-06-28 DIAGNOSIS — I480 Paroxysmal atrial fibrillation: Secondary | ICD-10-CM | POA: Diagnosis not present

## 2022-06-30 ENCOUNTER — Inpatient Hospital Stay (HOSPITAL_COMMUNITY)
Admission: AD | Admit: 2022-06-30 | Discharge: 2022-07-08 | DRG: 291 | Disposition: A | Discharge status: Home Health | Payer: Medicare PPO | Source: Other Acute Inpatient Hospital | Attending: Internal Medicine | Admitting: Internal Medicine

## 2022-06-30 ENCOUNTER — Other Ambulatory Visit: Payer: Self-pay

## 2022-06-30 DIAGNOSIS — E873 Alkalosis: Secondary | ICD-10-CM | POA: Diagnosis not present

## 2022-06-30 DIAGNOSIS — Z6841 Body Mass Index (BMI) 40.0 and over, adult: Secondary | ICD-10-CM | POA: Diagnosis not present

## 2022-06-30 DIAGNOSIS — Z1152 Encounter for screening for COVID-19: Secondary | ICD-10-CM | POA: Diagnosis not present

## 2022-06-30 DIAGNOSIS — Z79890 Hormone replacement therapy (postmenopausal): Secondary | ICD-10-CM | POA: Diagnosis not present

## 2022-06-30 DIAGNOSIS — E875 Hyperkalemia: Secondary | ICD-10-CM | POA: Diagnosis present

## 2022-06-30 DIAGNOSIS — I480 Paroxysmal atrial fibrillation: Secondary | ICD-10-CM | POA: Diagnosis not present

## 2022-06-30 DIAGNOSIS — D539 Nutritional anemia, unspecified: Secondary | ICD-10-CM | POA: Diagnosis present

## 2022-06-30 DIAGNOSIS — I2510 Atherosclerotic heart disease of native coronary artery without angina pectoris: Secondary | ICD-10-CM | POA: Diagnosis present

## 2022-06-30 DIAGNOSIS — E860 Dehydration: Secondary | ICD-10-CM | POA: Diagnosis present

## 2022-06-30 DIAGNOSIS — I3481 Nonrheumatic mitral (valve) annulus calcification: Secondary | ICD-10-CM | POA: Diagnosis not present

## 2022-06-30 DIAGNOSIS — D6869 Other thrombophilia: Secondary | ICD-10-CM | POA: Diagnosis not present

## 2022-06-30 DIAGNOSIS — N179 Acute kidney failure, unspecified: Secondary | ICD-10-CM | POA: Diagnosis not present

## 2022-06-30 DIAGNOSIS — I5032 Chronic diastolic (congestive) heart failure: Secondary | ICD-10-CM | POA: Diagnosis not present

## 2022-06-30 DIAGNOSIS — E6609 Other obesity due to excess calories: Secondary | ICD-10-CM | POA: Diagnosis not present

## 2022-06-30 DIAGNOSIS — R0602 Shortness of breath: Secondary | ICD-10-CM | POA: Diagnosis not present

## 2022-06-30 DIAGNOSIS — R7989 Other specified abnormal findings of blood chemistry: Secondary | ICD-10-CM | POA: Diagnosis not present

## 2022-06-30 DIAGNOSIS — E890 Postprocedural hypothyroidism: Secondary | ICD-10-CM | POA: Diagnosis present

## 2022-06-30 DIAGNOSIS — E782 Mixed hyperlipidemia: Secondary | ICD-10-CM | POA: Diagnosis present

## 2022-06-30 DIAGNOSIS — I5043 Acute on chronic combined systolic (congestive) and diastolic (congestive) heart failure: Secondary | ICD-10-CM | POA: Diagnosis not present

## 2022-06-30 DIAGNOSIS — Z952 Presence of prosthetic heart valve: Secondary | ICD-10-CM | POA: Diagnosis not present

## 2022-06-30 DIAGNOSIS — Z79899 Other long term (current) drug therapy: Secondary | ICD-10-CM

## 2022-06-30 DIAGNOSIS — N184 Chronic kidney disease, stage 4 (severe): Secondary | ICD-10-CM | POA: Diagnosis not present

## 2022-06-30 DIAGNOSIS — Z820 Family history of epilepsy and other diseases of the nervous system: Secondary | ICD-10-CM

## 2022-06-30 DIAGNOSIS — N1832 Chronic kidney disease, stage 3b: Secondary | ICD-10-CM | POA: Diagnosis present

## 2022-06-30 DIAGNOSIS — Z9049 Acquired absence of other specified parts of digestive tract: Secondary | ICD-10-CM

## 2022-06-30 DIAGNOSIS — I350 Nonrheumatic aortic (valve) stenosis: Secondary | ICD-10-CM | POA: Diagnosis not present

## 2022-06-30 DIAGNOSIS — I4892 Unspecified atrial flutter: Secondary | ICD-10-CM | POA: Diagnosis present

## 2022-06-30 DIAGNOSIS — E1122 Type 2 diabetes mellitus with diabetic chronic kidney disease: Secondary | ICD-10-CM | POA: Diagnosis present

## 2022-06-30 DIAGNOSIS — I4819 Other persistent atrial fibrillation: Secondary | ICD-10-CM | POA: Diagnosis present

## 2022-06-30 DIAGNOSIS — R079 Chest pain, unspecified: Secondary | ICD-10-CM | POA: Diagnosis not present

## 2022-06-30 DIAGNOSIS — Z66 Do not resuscitate: Secondary | ICD-10-CM | POA: Diagnosis present

## 2022-06-30 DIAGNOSIS — I2489 Other forms of acute ischemic heart disease: Secondary | ICD-10-CM | POA: Diagnosis present

## 2022-06-30 DIAGNOSIS — J449 Chronic obstructive pulmonary disease, unspecified: Secondary | ICD-10-CM | POA: Diagnosis present

## 2022-06-30 DIAGNOSIS — R001 Bradycardia, unspecified: Secondary | ICD-10-CM | POA: Diagnosis present

## 2022-06-30 DIAGNOSIS — Z96653 Presence of artificial knee joint, bilateral: Secondary | ICD-10-CM | POA: Diagnosis present

## 2022-06-30 DIAGNOSIS — Z953 Presence of xenogenic heart valve: Secondary | ICD-10-CM

## 2022-06-30 DIAGNOSIS — Z87891 Personal history of nicotine dependence: Secondary | ICD-10-CM

## 2022-06-30 DIAGNOSIS — Z951 Presence of aortocoronary bypass graft: Secondary | ICD-10-CM | POA: Diagnosis not present

## 2022-06-30 DIAGNOSIS — I214 Non-ST elevation (NSTEMI) myocardial infarction: Secondary | ICD-10-CM

## 2022-06-30 DIAGNOSIS — M79604 Pain in right leg: Secondary | ICD-10-CM | POA: Diagnosis not present

## 2022-06-30 DIAGNOSIS — J9611 Chronic respiratory failure with hypoxia: Secondary | ICD-10-CM | POA: Diagnosis not present

## 2022-06-30 DIAGNOSIS — R0789 Other chest pain: Secondary | ICD-10-CM | POA: Diagnosis not present

## 2022-06-30 DIAGNOSIS — E785 Hyperlipidemia, unspecified: Secondary | ICD-10-CM | POA: Diagnosis not present

## 2022-06-30 DIAGNOSIS — I4510 Unspecified right bundle-branch block: Secondary | ICD-10-CM | POA: Diagnosis present

## 2022-06-30 DIAGNOSIS — I5033 Acute on chronic diastolic (congestive) heart failure: Secondary | ICD-10-CM | POA: Diagnosis not present

## 2022-06-30 DIAGNOSIS — I10 Essential (primary) hypertension: Secondary | ICD-10-CM | POA: Diagnosis not present

## 2022-06-30 DIAGNOSIS — K219 Gastro-esophageal reflux disease without esophagitis: Secondary | ICD-10-CM | POA: Diagnosis present

## 2022-06-30 DIAGNOSIS — I25119 Atherosclerotic heart disease of native coronary artery with unspecified angina pectoris: Secondary | ICD-10-CM | POA: Diagnosis not present

## 2022-06-30 DIAGNOSIS — Z9981 Dependence on supplemental oxygen: Secondary | ICD-10-CM

## 2022-06-30 DIAGNOSIS — Z880 Allergy status to penicillin: Secondary | ICD-10-CM

## 2022-06-30 DIAGNOSIS — I130 Hypertensive heart and chronic kidney disease with heart failure and stage 1 through stage 4 chronic kidney disease, or unspecified chronic kidney disease: Principal | ICD-10-CM | POA: Diagnosis present

## 2022-06-30 DIAGNOSIS — Z7901 Long term (current) use of anticoagulants: Secondary | ICD-10-CM

## 2022-06-30 DIAGNOSIS — I272 Pulmonary hypertension, unspecified: Secondary | ICD-10-CM | POA: Diagnosis not present

## 2022-06-30 DIAGNOSIS — I428 Other cardiomyopathies: Secondary | ICD-10-CM | POA: Diagnosis not present

## 2022-06-30 DIAGNOSIS — I5030 Unspecified diastolic (congestive) heart failure: Secondary | ICD-10-CM | POA: Diagnosis not present

## 2022-06-30 DIAGNOSIS — Z888 Allergy status to other drugs, medicaments and biological substances status: Secondary | ICD-10-CM

## 2022-06-30 NOTE — H&P (Signed)
History and Physical    [name] BPZ:[id number] DOB: 1941-05-25 DOA: 06/30/2022  PCP: Glenda Chroman, MD  Patient coming from: [facility]  I have personally briefly reviewed patient's old medical records in [facility]  Chief Complaint: sob/chest pain /fluid overload  HPI: [name] is a 82 y.o. female with medical history significant of  Aortic stenosis s/p bioprostheic AVR,CAD s/p CABG , CHFpef, afib on Eliquis, CKD state IIIb,  essential hypertension,Hyperlipidemia, presents to from [facility] R with sob x 24 hours despite use of home O2 at 2-3L associaed with chest pressure. Patient also note interim history of increasing DOE over the last month.  Patient currently on arrival to [facility] has no complaints note breathing is at her baseline and is much improved s/p diuresis over the lat 72 hours in ED at [facility]. She states she has not has return of chest pain since admission to ED. She notes no cough/fever/chills/ n/v/or diarrhea.   8ED Course:  Per notes pateint found initially to have have elevated bnp in 2056and cxr noting pulmonary edema. Patient ce was elevated at 301, EKG noting RBBB.  Patient was slated for transfer for further cardiac care.  While at [facility] ED over the last 72 hours patient was continued on home medications and diuresed. ON evaluation for discharge patient was noted to be euvolemic and placed back on her home diuretic regimen.  Due to concern for type II NSTEMI patient was transferred to [facility] for further risk stratification.  Na 135, k 4.1, cr2.44 AST33, ALT 20 Alk phos 46   Wbc 12, hgb 11.1, plt 223,   Review of Systems: As per HPI otherwise 10 point review of systems negative.   Past Medical History:  Diagnosis Date   Aortic stenosis    Arthritis    Atrial fibrillation ([place])    Coronary atherosclerosis of native coronary artery    Multivessel   Essential hypertension    Gout    Hypothyroidism    Mixed hyperlipidemia    Psoriasis     Past Surgical History:   Procedure Laterality Date   AORTIC VALVE REPLACEMENT  2009   21 mm Edwards Magna pericardial valve   CARDIOVERSION N/A 10/20/2020   Procedure: CARDIOVERSION;  Surgeon: Satira Sark, MD;  Location: [facility];  Service: Cardiovascular;  Laterality: N/A;   CHOLECYSTECTOMY     2011   CORONARY ARTERY BYPASS GRAFT  2009   LIMA to LAD, SVG to RCA   JOINT REPLACEMENT  2012   L TOTAL KNEE   TOTAL KNEE ARTHROPLASTY  09/16/2011   Procedure: TOTAL KNEE ARTHROPLASTY;  Surgeon: Gearlean Alf, MD;  Location: [facility];  Service: Orthopedics;  Laterality: Right;   TOTAL THYROIDECTOMY  1970     reports that she quit smoking about 33 years ago. Her smoking use included cigarettes. She has a 7.50 pack-year smoking history. She has never been exposed to tobacco smoke. She has never used smokeless tobacco. She reports that she does not drink alcohol and does not use drugs.  Allergies  Allergen Reactions   Statins     REACTION: MUSCLE ACHE   Penicillins Swelling and Rash    Family History  Problem Relation Age of Onset   Aneurysm Father        Abdominal   Multiple sclerosis Sister     Prior to Admission medications   Medication Sig Start Date End Date Taking? Authorizing Provider  acetaminophen (TYLENOL) 650 MG CR tablet  Take 1,300 mg by mouth daily.    [provider]  allopurinol (ZYLOPRIM) 300 MG tablet Take 300 mg by mouth daily. 10/10/16   [provider]  atorvastatin (LIPITOR) 20 MG tablet Take 20 mg by mouth daily. 11/13/21   [provider]  docusate sodium (COLACE) 100 MG capsule Take 100 mg by mouth at bedtime.    [provider]  ELIQUIS 2.5 MG TABS tablet TAKE 1 TABLET BY MOUTH TWICE DAILY 01/31/22   [name], MD  empagliflozin (JARDIANCE) 10 MG TABS tablet Take 1 tablet (10 mg total) by mouth daily before breakfast. 03/12/22   [name], MD  fenofibrate (TRICOR) 145 MG tablet Take 145 mg by mouth daily. 09/18/20   [provider]  ferrous sulfate 325 (65 FE) MG EC tablet Take 325 mg by mouth at bedtime.    [provider]  levothyroxine (SYNTHROID, LEVOTHROID) 125 MCG tablet Take 125 mcg by mouth daily before breakfast.    [provider]  losartan (COZAAR) 25 MG tablet Take 0.5 tablets (12.5 mg total) by mouth daily. 01/28/22   [name], MD  metolazone (ZAROXOLYN) 5 MG tablet TAKE 1 TABLET BY MOUTH ONCE WEEKLY ON WEDNESDAY (TAKE 1 EXTRA POTASSIUM TABLET WITH IT) 05/09/22   [name], MD  metoprolol succinate (TOPROL-XL) 25 MG 24 hr tablet Take 1 tablet (25 mg total) by mouth daily. 01/28/22   [name], MD  Multiple Vitamins-Minerals (CENTRUM SILVER PO) Take 1 tablet by mouth at bedtime.    [provider]  pantoprazole (PROTONIX) 40 MG tablet Take 40 mg by mouth daily. 10/21/16   [provider]  potassium chloride SA (KLOR-CON M) 20 MEQ tablet Take 1 tablet (20 mEq total) by mouth daily. Take 1 extra pill on Wednesday with metolazone 02/07/22   [name], MD  torsemide (DEMADEX) 20 MG tablet Take 3 tablets (60 mg total) by mouth 2 (two) times daily. 03/12/22   [name], MD  Vitamin D, Ergocalciferol, (DRISDOL) 50000 UNITS CAPS Take 50,000 Units by mouth every Sunday. 09/04/11   [provider]    Physical Exam: Vitals:   06/30/22 2255  BP: (!) 160/33  Resp: 16  Temp: 99.8 F (37.7 C)  TempSrc: Oral  SpO2: 94%  Weight: 113.7 kg  Height: 5\' 3"  (1.6 m)    Constitutional: NAD, calm, comfortable Vitals:   06/30/22 2255  BP: (!) 160/33  Resp: 16  Temp: 99.8 F (37.7 C)  TempSrc: Oral  SpO2: 94%  Weight: 113.7 kg  Height: 5\' 3"  (1.6 m)   Eyes: PERRL, lids and conjunctivae normal ENMT: Mucous membranes are moist. Posterior pharynx clear of any exudate or lesions.Normal dentition.  Neck: normal, supple, no masses, no thyromegaly Respiratory: clear to auscultation bilaterally, no wheezing, no crackles. Normal  respiratory effort. No accessory muscle use.  Cardiovascular: Regular rate and rhythm, no murmurs / rubs / gallops. trace extremity edema. 2+ pedal pulses.  Abdomen: no tenderness, no masses palpated. No hepatosplenomegaly. Bowel sounds positive.  Musculoskeletal: no clubbing / cyanosis. No joint deformity upper and lower extremities. Good ROM, no contractures. Normal muscle tone.  Skin: no rashes, lesions, ulcers. No induration Neurologic: CN 2-12 grossly intact. Sensation intact, Strength 5/5 in all 4.  Psychiatric: Normal judgment and insight. Alert and oriented x 3. Normal mood.    Labs on Admission: I have personally reviewed following labs and imaging studies  CBC: No results for input(s): "WBC", "NEUTROABS", "  HGB", "HCT", "MCV", "PLT" in the last 168 hours. Basic Metabolic Panel: No results for input(s): "NA", "K", "CL", "CO2", "GLUCOSE", "BUN", "CREATININE", "CALCIUM", "MG", "PHOS" in the last 168 hours. GFR: CrCl cannot be calculated (Patient's most recent lab result is older than the maximum 21 days allowed.). Liver Function Tests: No results for input(s): "AST", "ALT", "ALKPHOS", "BILITOT", "PROT", "ALBUMIN" in the last 168 hours. No results for input(s): "LIPASE", "AMYLASE" in the last 168 hours. No results for input(s): "AMMONIA" in the last 168 hours. Coagulation Profile: No results for input(s): "INR", "PROTIME" in the last 168 hours. Cardiac Enzymes: No results for input(s): "CKTOTAL", "CKMB", "CKMBINDEX", "TROPONINI" in the last 168 hours. BNP (last 3 results) No results for input(s): "PROBNP" in the last 8760 hours. HbA1C: No results for input(s): "HGBA1C" in the last 72 hours. CBG: No results for input(s): "GLUCAP" in the last 168 hours. Lipid Profile: No results for input(s): "CHOL", "HDL", "LDLCALC", "TRIG", "CHOLHDL", "LDLDIRECT" in the last 72 hours. Thyroid Function Tests: No results for input(s): "TSH", "T4TOTAL", "FREET4", "T3FREE", "THYROIDAB" in the last  72 hours. Anemia Panel: No results for input(s): "VITAMINB12", "FOLATE", "FERRITIN", "TIBC", "IRON", "RETICCTPCT" in the last 72 hours. Urine analysis:    Component Value Date/Time   COLORURINE STRAW (A) 10/24/2020 2211   APPEARANCEUR CLEAR 10/24/2020 2211   LABSPEC 1.006 10/24/2020 2211   PHURINE 5.0 10/24/2020 2211   GLUCOSEU NEGATIVE 10/24/2020 2211   HGBUR NEGATIVE 10/24/2020 2211   BILIRUBINUR NEGATIVE 10/24/2020 2211   KETONESUR NEGATIVE 10/24/2020 2211   PROTEINUR NEGATIVE 10/24/2020 2211   UROBILINOGEN 0.2 09/06/2011 1417   NITRITE NEGATIVE 10/24/2020 2211   LEUKOCYTESUR NEGATIVE 10/24/2020 2211    Radiological Exams on Admission: No results found.  EKG: Independently reviewed. Snr ,rbbb  Assessment/Plan   NSTEMI , r/o type I -ce 301 , repeat on admit  MC 56,54 -continue atorvastatin, metoprolol -on OAC for Afib   P. Afib -currently sinus prior DCCV 2022 -continue metoprolol 25mg  daily  -continue Eliquis   CKDIIIB  At baseline    Acute on Chronic Hfpef -s/p treatment x 72 hours at OSH with resolution and transition back to home oral regimen  -will continue torsemide 60 mg po daily  -strict I/o daily weights  -continue Jardiance   Hypertension  -resume home regimen   HLD -continue statin   Hypothyroid -continue synthroid   GERD -ppi   DVT prophylaxis: Eliquis Code Status:DNRl/ as discussed per patient wishes in event of cardiac arrest  Family Communication: none at bedside Disposition Plan: cardiology  Consults called:.Cardiology Admission status: cardiac tele   Dion Body MD [facility]  If 7PM-7AM, please contact night-coverage [email or web address] Password [facility]  06/30/2022, 11:41 PM

## 2022-06-30 NOTE — Progress Notes (Addendum)
Pt has arrived to unit from [facility]. Room still being cleaned - [facility] waiting with patient outside of room. Dr. Bridgett Larsson notified of pt arrival.   Dr. Bridgett Larsson is not admitting tonight. Dr. Marcello Moores paged for orders.

## 2022-06-30 NOTE — H&P (Incomplete)
History and Physical    [name] AYT:[id number] DOB: 04-25-1941 DOA: 06/30/2022  PCP: Ignatius Specking, MD  Patient coming from: ***  I have personally briefly reviewed patient's old medical records in [facility]  Chief Complaint: ***  HPI: April FOSKETT is a 82 y.o. female with medical history significant of    ED Course: ***  Review of Systems: As per HPI otherwise 10 point review of systems negative.   Past Medical History:  Diagnosis Date  . Aortic stenosis   . Arthritis   . Atrial fibrillation (HCC)   . Coronary atherosclerosis of native coronary artery    Multivessel  . Essential hypertension   . Gout   . Hypothyroidism   . Mixed hyperlipidemia   . Psoriasis     Past Surgical History:  Procedure Laterality Date  . AORTIC VALVE REPLACEMENT  2009   21 mm [facility] pericardial valve  . CARDIOVERSION N/A 10/20/2020   Procedure: CARDIOVERSION;  Surgeon: [name], MD;  Location: [facility];  Service: Cardiovascular;  Laterality: N/A;  . CHOLECYSTECTOMY     2011  . CORONARY ARTERY BYPASS GRAFT  2009   LIMA to LAD, SVG to RCA  . JOINT REPLACEMENT  2012   L TOTAL KNEE  . TOTAL KNEE ARTHROPLASTY  09/16/2011   Procedure: TOTAL KNEE ARTHROPLASTY;  Surgeon: Loanne Drilling, MD;  Location: [facility];  Service: Orthopedics;  Laterality: Right;  . TOTAL THYROIDECTOMY  1970     reports that she quit smoking about 33 years ago. Her smoking use included cigarettes. She has a 7.50 pack-year smoking history. She has never been exposed to tobacco smoke. She has never used smokeless tobacco. She reports that she does not drink alcohol and does not use drugs.  Allergies  Allergen Reactions  . Statins     REACTION: MUSCLE ACHE  . Penicillins Swelling and Rash    Family History  Problem Relation Age of Onset  . Aneurysm Father        Abdominal  . Multiple sclerosis Sister    *** Prior to Admission medications   Medication Sig Start Date End Date Taking?  Authorizing Provider  acetaminophen (TYLENOL) 650 MG CR tablet Take 1,300 mg by mouth daily.    [provider]  allopurinol (ZYLOPRIM) 300 MG tablet Take 300 mg by mouth daily. 10/10/16   [provider]  atorvastatin (LIPITOR) 20 MG tablet Take 20 mg by mouth daily. 11/13/21   [provider]  docusate sodium (COLACE) 100 MG capsule Take 100 mg by mouth at bedtime.    [provider]  ELIQUIS 2.5 MG TABS tablet TAKE 1 TABLET BY MOUTH TWICE DAILY 01/31/22   [name], MD  empagliflozin (JARDIANCE) 10 MG TABS tablet Take 1 tablet (10 mg total) by mouth daily before breakfast. 03/12/22   [name], MD  fenofibrate (TRICOR) 145 MG tablet Take 145 mg by mouth daily. 09/18/20   [provider]  ferrous sulfate 325 (65 FE) MG EC tablet Take 325 mg by mouth at bedtime.    [provider]  levothyroxine (SYNTHROID, LEVOTHROID) 125 MCG tablet Take 125 mcg by mouth daily before breakfast.    [provider]  losartan (COZAAR) 25 MG tablet Take 0.5 tablets (12.5 mg total) by mouth daily. 01/28/22   [name], MD  metolazone (ZAROXOLYN) 5 MG tablet TAKE 1 TABLET BY MOUTH ONCE WEEKLY ON WEDNESDAY (TAKE 1 EXTRA POTASSIUM TABLET WITH  IT) 05/09/22   Satira Sark, MD  metoprolol succinate (TOPROL-XL) 25 MG 24 hr tablet Take 1 tablet (25 mg total) by mouth daily. 01/28/22   Satira Sark, MD  Multiple Vitamins-Minerals (CENTRUM SILVER PO) Take 1 tablet by mouth at bedtime.    [provider]  pantoprazole (PROTONIX) 40 MG tablet Take 40 mg by mouth daily. 10/21/16   [provider]  potassium chloride SA (KLOR-CON M) 20 MEQ tablet Take 1 tablet (20 mEq total) by mouth daily. Take 1 extra pill on Wednesday with metolazone 02/07/22   Satira Sark, MD  torsemide (DEMADEX) 20 MG tablet Take 3 tablets (60 mg total) by mouth 2 (two) times daily. 03/12/22   Satira Sark, MD  Vitamin D, Ergocalciferol,  (DRISDOL) 50000 UNITS CAPS Take 50,000 Units by mouth every Sunday. 09/04/11   [provider]    Physical Exam: Vitals:   06/30/22 2255  BP: (!) 160/33  Resp: 16  Temp: 99.8 F (37.7 C)  TempSrc: Oral  SpO2: 94%  Weight: 113.7 kg  Height: 5\' 3"  (1.6 m)    Constitutional: NAD, calm, comfortable Vitals:   06/30/22 2255  BP: (!) 160/33  Resp: 16  Temp: 99.8 F (37.7 C)  TempSrc: Oral  SpO2: 94%  Weight: 113.7 kg  Height: 5\' 3"  (1.6 m)   Eyes: PERRL, lids and conjunctivae normal ENMT: Mucous membranes are moist. Posterior pharynx clear of any exudate or lesions.Normal dentition.  Neck: normal, supple, no masses, no thyromegaly Respiratory: clear to auscultation bilaterally, no wheezing, no crackles. Normal respiratory effort. No accessory muscle use.  Cardiovascular: Regular rate and rhythm, no murmurs / rubs / gallops. No extremity edema. 2+ pedal pulses. No carotid bruits.  Abdomen: no tenderness, no masses palpated. No hepatosplenomegaly. Bowel sounds positive.  Musculoskeletal: no clubbing / cyanosis. No joint deformity upper and lower extremities. Good ROM, no contractures. Normal muscle tone.  Skin: no rashes, lesions, ulcers. No induration Neurologic: CN 2-12 grossly intact. Sensation intact, DTR normal. Strength 5/5 in all 4.  Psychiatric: Normal judgment and insight. Alert and oriented x 3. Normal mood.    Labs on Admission: I have personally reviewed following labs and imaging studies  CBC: No results for input(s): "WBC", "NEUTROABS", "HGB", "HCT", "MCV", "PLT" in the last 168 hours. Basic Metabolic Panel: No results for input(s): "NA", "K", "CL", "CO2", "GLUCOSE", "BUN", "CREATININE", "CALCIUM", "MG", "PHOS" in the last 168 hours. GFR: CrCl cannot be calculated (Patient's most recent lab result is older than the maximum 21 days allowed.). Liver Function Tests: No results for input(s): "AST", "ALT", "ALKPHOS", "BILITOT", "PROT", "ALBUMIN" in the last  168 hours. No results for input(s): "LIPASE", "AMYLASE" in the last 168 hours. No results for input(s): "AMMONIA" in the last 168 hours. Coagulation Profile: No results for input(s): "INR", "PROTIME" in the last 168 hours. Cardiac Enzymes: No results for input(s): "CKTOTAL", "CKMB", "CKMBINDEX", "TROPONINI" in the last 168 hours. BNP (last 3 results) No results for input(s): "PROBNP" in the last 8760 hours. HbA1C: No results for input(s): "HGBA1C" in the last 72 hours. CBG: No results for input(s): "GLUCAP" in the last 168 hours. Lipid Profile: No results for input(s): "CHOL", "HDL", "LDLCALC", "TRIG", "CHOLHDL", "LDLDIRECT" in the last 72 hours. Thyroid Function Tests: No results for input(s): "TSH", "T4TOTAL", "FREET4", "T3FREE", "THYROIDAB" in the last 72 hours. Anemia Panel: No results for input(s): "VITAMINB12", "FOLATE", "FERRITIN", "TIBC", "IRON", "RETICCTPCT" in the last 72 hours. Urine analysis:    Component Value Date/Time  COLORURINE STRAW (A) 10/24/2020 2211   APPEARANCEUR CLEAR 10/24/2020 2211   LABSPEC 1.006 10/24/2020 2211   PHURINE 5.0 10/24/2020 2211   GLUCOSEU NEGATIVE 10/24/2020 2211   HGBUR NEGATIVE 10/24/2020 2211   BILIRUBINUR NEGATIVE 10/24/2020 2211   KETONESUR NEGATIVE 10/24/2020 2211   PROTEINUR NEGATIVE 10/24/2020 2211   UROBILINOGEN 0.2 09/06/2011 1417   NITRITE NEGATIVE 10/24/2020 2211   LEUKOCYTESUR NEGATIVE 10/24/2020 2211    Radiological Exams on Admission: No results found.  EKG: Independently reviewed. ***  Assessment/Plan Active Problems:   * No active hospital problems. *   ***  DVT prophylaxis: *** (Lovenox/Heparin/SCD's/anticoagulated/None (if comfort care) Code Status: *** (Full/Partial (specify details) Family Communication: *** (Specify name, relationship. Do not write "discussed with patient". Specify tel # if discussed over the phone) Disposition Plan: *** (specify when and where you expect patient to be  discharged) Consults called: *** (with names) Admission status: *** (inpatient / obs / tele / medical floor / SDU)   Lurline Del MD [facility] Pager [phone]  If 7PM-7AM, please contact night-coverage [email or web address] Password [facility]  06/30/2022, 11:41 PM

## 2022-07-01 ENCOUNTER — Other Ambulatory Visit (HOSPITAL_COMMUNITY): Payer: Medicare PPO

## 2022-07-01 ENCOUNTER — Encounter (HOSPITAL_COMMUNITY): Payer: Self-pay | Admitting: Internal Medicine

## 2022-07-01 DIAGNOSIS — J9611 Chronic respiratory failure with hypoxia: Secondary | ICD-10-CM

## 2022-07-01 DIAGNOSIS — I4819 Other persistent atrial fibrillation: Secondary | ICD-10-CM

## 2022-07-01 DIAGNOSIS — I10 Essential (primary) hypertension: Secondary | ICD-10-CM

## 2022-07-01 DIAGNOSIS — I5033 Acute on chronic diastolic (congestive) heart failure: Secondary | ICD-10-CM | POA: Diagnosis not present

## 2022-07-01 DIAGNOSIS — R0789 Other chest pain: Secondary | ICD-10-CM | POA: Diagnosis not present

## 2022-07-01 DIAGNOSIS — E785 Hyperlipidemia, unspecified: Secondary | ICD-10-CM

## 2022-07-01 DIAGNOSIS — I2510 Atherosclerotic heart disease of native coronary artery without angina pectoris: Secondary | ICD-10-CM | POA: Diagnosis not present

## 2022-07-01 DIAGNOSIS — R079 Chest pain, unspecified: Principal | ICD-10-CM | POA: Diagnosis present

## 2022-07-01 LAB — BRAIN NATRIURETIC PEPTIDE: B Natriuretic Peptide: 458.2 pg/mL — ABNORMAL HIGH (ref 0.0–100.0)

## 2022-07-01 LAB — CBC
HCT: 31.9 % — ABNORMAL LOW (ref 36.0–46.0)
Hemoglobin: 10 g/dL — ABNORMAL LOW (ref 12.0–15.0)
MCH: 33.7 pg (ref 26.0–34.0)
MCHC: 31.3 g/dL (ref 30.0–36.0)
MCV: 107.4 fL — ABNORMAL HIGH (ref 80.0–100.0)
Platelets: 163 10*3/uL (ref 150–400)
RBC: 2.97 MIL/uL — ABNORMAL LOW (ref 3.87–5.11)
RDW: 14 % (ref 11.5–15.5)
WBC: 8.5 10*3/uL (ref 4.0–10.5)
nRBC: 0 % (ref 0.0–0.2)

## 2022-07-01 LAB — COMPREHENSIVE METABOLIC PANEL
ALT: 10 U/L (ref 0–44)
AST: 24 U/L (ref 15–41)
Albumin: 3.1 g/dL — ABNORMAL LOW (ref 3.5–5.0)
Alkaline Phosphatase: 40 U/L (ref 38–126)
Anion gap: 13 (ref 5–15)
BUN: 61 mg/dL — ABNORMAL HIGH (ref 8–23)
CO2: 39 mmol/L — ABNORMAL HIGH (ref 22–32)
Calcium: 8.8 mg/dL — ABNORMAL LOW (ref 8.9–10.3)
Chloride: 87 mmol/L — ABNORMAL LOW (ref 98–111)
Creatinine, Ser: 2.4 mg/dL — ABNORMAL HIGH (ref 0.44–1.00)
GFR, Estimated: 20 mL/min — ABNORMAL LOW (ref >60)
Glucose, Bld: 100 mg/dL — ABNORMAL HIGH (ref 70–99)
Potassium: 3.4 mmol/L — ABNORMAL LOW (ref 3.5–5.1)
Sodium: 139 mmol/L (ref 135–145)
Total Bilirubin: 0.5 mg/dL (ref 0.3–1.2)
Total Protein: 6.1 g/dL — ABNORMAL LOW (ref 6.5–8.1)

## 2022-07-01 LAB — D-DIMER, QUANTITATIVE: D-Dimer, Quant: 0.49 ug/mL-FEU (ref 0.00–0.50)

## 2022-07-01 LAB — TROPONIN I (HIGH SENSITIVITY)
Troponin I (High Sensitivity): 56 ng/L — ABNORMAL HIGH (ref <18)
Troponin I (High Sensitivity): 54 ng/L — ABNORMAL HIGH (ref <18)

## 2022-07-01 MED ORDER — FENOFIBRATE 145 MG PO TABS
145.0000 mg | ORAL_TABLET | Freq: Every day | ORAL | Status: DC
  Filled 2022-07-01: qty 1

## 2022-07-01 MED ORDER — SENNOSIDES-DOCUSATE SODIUM 8.6-50 MG PO TABS
1.0000 | ORAL_TABLET | Freq: Two times a day (BID) | ORAL | Status: AC
  Administered 2022-07-01 – 2022-07-02 (×4): 1 via ORAL
  Filled 2022-07-01 (×4): qty 1

## 2022-07-01 MED ORDER — PANTOPRAZOLE SODIUM 40 MG PO TBEC
40.0000 mg | DELAYED_RELEASE_TABLET | Freq: Every day | ORAL | Status: DC
  Administered 2022-07-01 – 2022-07-08 (×8): 40 mg via ORAL
  Filled 2022-07-01 (×8): qty 1

## 2022-07-01 MED ORDER — ALLOPURINOL 300 MG PO TABS
300.0000 mg | ORAL_TABLET | Freq: Every day | ORAL | Status: DC
  Administered 2022-07-01 – 2022-07-08 (×8): 300 mg via ORAL
  Filled 2022-07-01 (×8): qty 1

## 2022-07-01 MED ORDER — ATORVASTATIN CALCIUM 10 MG PO TABS
20.0000 mg | ORAL_TABLET | Freq: Every day | ORAL | Status: DC
  Administered 2022-07-01 – 2022-07-08 (×8): 20 mg via ORAL
  Filled 2022-07-01 (×9): qty 2

## 2022-07-01 MED ORDER — LOSARTAN POTASSIUM 25 MG PO TABS: 12.5000 mg | ORAL_TABLET | Freq: Every day | ORAL | Status: DC

## 2022-07-01 MED ORDER — ONDANSETRON HCL 4 MG/2ML IJ SOLN: 4.0000 mg | Freq: Four times a day (QID) | INTRAMUSCULAR | Status: DC | PRN

## 2022-07-01 MED ORDER — ACETAMINOPHEN 325 MG PO TABS
650.0000 mg | ORAL_TABLET | ORAL | Status: DC | PRN
  Administered 2022-07-01 – 2022-07-03 (×4): 650 mg via ORAL
  Filled 2022-07-01 (×5): qty 2

## 2022-07-01 MED ORDER — LEVOTHYROXINE SODIUM 25 MCG PO TABS
125.0000 ug | ORAL_TABLET | Freq: Every day | ORAL | Status: DC
  Administered 2022-07-01 – 2022-07-08 (×8): 125 ug via ORAL
  Filled 2022-07-01 (×8): qty 1

## 2022-07-01 MED ORDER — BISACODYL 10 MG RE SUPP: 10.0000 mg | RECTAL | Status: DC | PRN

## 2022-07-01 MED ORDER — FENOFIBRATE 160 MG PO TABS
160.0000 mg | ORAL_TABLET | Freq: Every day | ORAL | Status: DC
  Administered 2022-07-01 – 2022-07-08 (×8): 160 mg via ORAL
  Filled 2022-07-01 (×8): qty 1

## 2022-07-01 MED ORDER — METOPROLOL TARTRATE 25 MG/10 ML ORAL SUSPENSION
25.0000 mg | Freq: Two times a day (BID) | ORAL | Status: DC
  Filled 2022-07-01: qty 10

## 2022-07-01 MED ORDER — EMPAGLIFLOZIN 10 MG PO TABS
10.0000 mg | ORAL_TABLET | Freq: Every day | ORAL | Status: DC
  Administered 2022-07-01 – 2022-07-08 (×8): 10 mg via ORAL
  Filled 2022-07-01 (×8): qty 1

## 2022-07-01 MED ORDER — MELATONIN 5 MG PO TABS
5.0000 mg | ORAL_TABLET | Freq: Every evening | ORAL | Status: DC | PRN
  Administered 2022-07-01 – 2022-07-05 (×5): 5 mg via ORAL
  Filled 2022-07-01 (×5): qty 1

## 2022-07-01 MED ORDER — TORSEMIDE 20 MG PO TABS
60.0000 mg | ORAL_TABLET | Freq: Every day | ORAL | Status: DC
  Administered 2022-07-01: 60 mg via ORAL
  Filled 2022-07-01: qty 3

## 2022-07-01 MED ORDER — APIXABAN 2.5 MG PO TABS
2.5000 mg | ORAL_TABLET | Freq: Two times a day (BID) | ORAL | Status: DC
  Administered 2022-07-01 – 2022-07-08 (×15): 2.5 mg via ORAL
  Filled 2022-07-01 (×15): qty 1

## 2022-07-01 MED ORDER — FUROSEMIDE 10 MG/ML IJ SOLN
80.0000 mg | Freq: Two times a day (BID) | INTRAMUSCULAR | Status: DC
  Administered 2022-07-01 – 2022-07-04 (×6): 80 mg via INTRAVENOUS
  Filled 2022-07-01 (×6): qty 8

## 2022-07-01 MED ORDER — METOPROLOL SUCCINATE ER 25 MG PO TB24
25.0000 mg | ORAL_TABLET | Freq: Every day | ORAL | Status: DC
  Administered 2022-07-01 – 2022-07-03 (×3): 25 mg via ORAL
  Filled 2022-07-01 (×3): qty 1

## 2022-07-01 MED ORDER — ORAL CARE MOUTH RINSE: 15.0000 mL | OROMUCOSAL | Status: DC | PRN

## 2022-07-01 MED ORDER — DOCUSATE SODIUM 100 MG PO CAPS
100.0000 mg | ORAL_CAPSULE | Freq: Two times a day (BID) | ORAL | Status: DC
  Administered 2022-07-01 – 2022-07-02 (×2): 100 mg via ORAL
  Filled 2022-07-01 (×2): qty 1

## 2022-07-01 MED ORDER — POTASSIUM CHLORIDE CRYS ER 20 MEQ PO TBCR
40.0000 meq | EXTENDED_RELEASE_TABLET | Freq: Once | ORAL | Status: AC
  Administered 2022-07-01: 40 meq via ORAL

## 2022-07-01 MED ORDER — BISACODYL 5 MG PO TBEC
10.0000 mg | DELAYED_RELEASE_TABLET | Freq: Every day | ORAL | Status: DC | PRN
  Administered 2022-07-01 – 2022-07-08 (×5): 10 mg via ORAL
  Filled 2022-07-01 (×5): qty 2

## 2022-07-01 NOTE — Evaluation (Signed)
Physical Therapy Evaluation Patient Details Name: [name] MRN: [id number] DOB: 27-Dec-1940 Today's Date: 07/01/2022  History of Present Illness  Pt is a 82 y.o. F who presents to [facility] on 1/3 with chest pain, SOB, and noted to have elevated troponin and BNP of 2056. Chest x-ray with pulmonary vascular congestion. Concern for NSTEMI at [facility] and pt transferred to [facility] service for further evaluation. She was diuresed, had an echocardiogram which noted EF of 65% with grade 2 diastolic dysfunction, normal RV. Significant PMH: CAD/CABG, chronic diastolic CHF, COPD/chronic respiratory failure on 2 L home O2 persistent atrial flutter/fibrillation.  Clinical Impression  PTA, pt lives alone, is a household ambulator using a RW, and independent with ADL's. Pt friend is available to assist upon d/c. Pt presents with decreased cardiopulmonary endurance, impaired standing balance, generalized weakness, increased oxygen requirement. Pt ambulating 30 ft with a walker and min guard assist. SpO2 90% on 4L O2, HR 60-80 bpm. Pt would benefit from HHPT at d/c to address deficits.     Recommendations for follow up therapy are one component of a multi-disciplinary discharge planning process, led by the attending physician.  Recommendations may be updated based on patient status, additional functional criteria and insurance authorization.  Follow Up Recommendations Home health PT      Assistance Recommended at Discharge PRN  Patient can return home with the following  A little help with bathing/dressing/bathroom;Assistance with cooking/housework;Assist for transportation;Help with stairs or ramp for entrance    Equipment Recommendations None recommended by PT  Recommendations for Other Services       Functional Status Assessment Patient has had a recent decline in their functional status and demonstrates the ability to make significant improvements in function in a reasonable and predictable  amount of time.     Precautions / Restrictions Precautions Precautions: Fall;Other (comment) Precaution Comments: on 2L O2 at baseline Restrictions Weight Bearing Restrictions: No      Mobility  Bed Mobility Overal bed mobility: Needs Assistance Bed Mobility: Supine to Sit     Supine to sit: Supervision          Transfers Overall transfer level: Needs assistance Equipment used: Rolling walker (2 wheels) Transfers: Sit to/from Stand Sit to Stand: Min guard, Min assist           General transfer comment: Min guard from edge of bed, minA from lower toilet    Ambulation/Gait Ambulation/Gait assistance: Min guard Gait Distance (Feet): 30 Feet Assistive device: Rolling walker (2 wheels) Gait Pattern/deviations: Step-through pattern, Decreased stride length Gait velocity: decreased     General Gait Details: Min guard for safety, fatigues easily  Stairs            Wheelchair Mobility    Modified Rankin (Stroke Patients Only)       Balance Overall balance assessment: Needs assistance Sitting-balance support: Feet supported Sitting balance-Leahy Scale: Good     Standing balance support: Bilateral upper extremity supported Standing balance-Leahy Scale: Poor                               Pertinent Vitals/Pain Pain Assessment Pain Assessment: Faces Faces Pain Scale: Hurts little more Pain Location: R low back Pain Descriptors / Indicators: Discomfort, Grimacing, Guarding, Tender Pain Intervention(s): Limited activity within patient's tolerance, Monitored during session    Home Living Family/patient expects to be discharged to:: Private residence Living Arrangements: Alone Available Help at Discharge: Friend(s) Type of  Home: House Home Access: Stairs to enter Entrance Stairs-Rails: Doctor, general practice of Steps: 6   Home Layout: One level Home Equipment: Agricultural consultant (2 wheels);Shower seat      Prior Function  Prior Level of Function : Independent/Modified Independent             Mobility Comments: hx of 1 fall within last 3 months. household ambulator, uses walker. sleeps in recliner. ADLs Comments: indep ADL's. pt reports she mainly does "sandwiches," for meals     Hand Dominance        Extremity/Trunk Assessment   Upper Extremity Assessment Upper Extremity Assessment: Defer to OT evaluation    Lower Extremity Assessment Lower Extremity Assessment: Generalized weakness       Communication   Communication: No difficulties  Cognition Arousal/Alertness: Awake/alert Behavior During Therapy: WFL for tasks assessed/performed Overall Cognitive Status: Within Functional Limits for tasks assessed                                          General Comments      Exercises     Assessment/Plan    PT Assessment Patient needs continued PT services  PT Problem List Decreased strength;Decreased activity tolerance;Decreased mobility;Decreased balance;Cardiopulmonary status limiting activity       PT Treatment Interventions DME instruction;Gait training;Stair training;Functional mobility training;Therapeutic activities;Therapeutic exercise;Balance training;Patient/family education    PT Goals (Current goals can be found in the Care Plan section)  Acute Rehab PT Goals Patient Stated Goal: back to baseline PT Goal Formulation: With patient Time For Goal Achievement: 07/15/22 Potential to Achieve Goals: Good    Frequency Min 3X/week     Co-evaluation               AM-PAC PT "6 Clicks" Mobility  Outcome Measure Help needed turning from your back to your side while in a flat bed without using bedrails?: A Little Help needed moving from lying on your back to sitting on the side of a flat bed without using bedrails?: A Little Help needed moving to and from a bed to a chair (including a wheelchair)?: A Little Help needed standing up from a chair using your  arms (e.g., wheelchair or bedside chair)?: A Little Help needed to walk in hospital room?: A Little Help needed climbing 3-5 steps with a railing? : A Lot 6 Click Score: 17    End of Session Equipment Utilized During Treatment: Gait belt;Oxygen Activity Tolerance: Patient tolerated treatment well Patient left: in chair;with call bell/phone within reach;with chair alarm set;with family/visitor present Nurse Communication: Mobility status PT Visit Diagnosis: Unsteadiness on feet (R26.81);Muscle weakness (generalized) (M62.81)    Time: 4469-5072 PT Time Calculation (min) (ACUTE ONLY): 32 min   Charges:   PT Evaluation $PT Eval Moderate Complexity: 1 Mod PT Treatments $Therapeutic Activity: 8-22 mins        Lillia Pauls, PT, DPT Acute Rehabilitation Services Office [phone]   Norval Morton 07/01/2022, 2:38 PM

## 2022-07-01 NOTE — Progress Notes (Signed)
   Heart Failure Stewardship Pharmacist Progress Note   PCP: Glenda Chroman, MD PCP-Cardiologist: Rozann Lesches, MD    HPI:  82 yo F with PMH of CAD s/p CABG and AVR in 2009, CHF, CKD IV, COPD, and afib.  She presented to [facility] on 1/3 with chest pain and shortness of breath. Found to have elevated troponin and BNP. CXR with pulmonary vascular congestion. Recived IV diuretics. ECHO showed LVEF 65% with G2DD and normal RV. Transferred to [facility] on 1/7 for further evaluation. No plan for LHC with renal dysfunction.   Current HF Medications: Diuretic: furosemide 80 mg IV BID Beta Blocker: metoprolol XL 25 mg daily SGLT2i: Jardiance 10 mg daily  Prior to admission HF Medications: Diuretic: torsemide 60 mg daily + metolazone 5 mg once weekly Beta blocker: metoprolol XL 25 mg daily ACE/ARB/ARNI: losartan 12.5 mg daily SGLT2i: Jardiance 10 mg daily  Pertinent Lab Values: Serum creatinine 2.40, BUN 61, Potassium 3.4, Sodium 139, BNP 2302>>458.2  Vital Signs: Weight: 250 lbs  Blood pressure: 130/50s Heart rate: 60-80s  I/O: -0.3L yesterday; net since arrival to [facility] -2.9L  Medication Assistance / Insurance Benefits Check: Does the patient have prescription insurance?  Yes Type of insurance plan: [employer]  Outpatient Pharmacy:  Prior to admission outpatient pharmacy: [facility] Is the patient willing to use [facility] at discharge? Yes Is the patient willing to transition their outpatient pharmacy to utilize a [facility] outpatient pharmacy?   Pending   Assessment: 1. Acute on chronic diastolic CHF (LVEF 24%). NYHA class III symptoms. - Continue furosemide 80 mg IV BID. Strict I/Os and daily weights. Keep K>4. Check magnesium in AM. - Continue metoprolol XL 25 mg daily - Consider resuming losartan prior to discharge once creatinine stabilizes.  - Continue Jardiance 10 mg daily. eGFR 20.   Plan: 1) Medication changes recommended at this time: - None  2)  Patient assistance: - None pending  3)  Education  - To be completed prior to discharge  Kerby Nora, PharmD, BCPS Heart Failure Stewardship Pharmacist Phone [phone]

## 2022-07-01 NOTE — Consult Note (Signed)
Cardiology Consultation   Patient ID: [name] MRN: [id number]; DOB: 11/22/1940  Admit date: 06/30/2022 Date of Consult: 07/01/2022  PCP:  Ignatius Specking, MD    [facility]Care Providers Cardiologist:  [name], MD     Patient Profile:   [name] is a 82 y.o. female with a history of CAD s/p CABG x2 (LIMA to LAD and SVG to RCA) in 2009, chronic HFpEF, persistent atrial fibrillation/ flutter on Eliquis, aortic stenosis s/p bioprosthetic AVR in 2009 at time of CABG, hypertension, hyperlipidemia, CKD stage IV, and hypothyroidism following thyroidectomy in 1970, who is being seen for the evaluation of chest pain at the request of Dr. Jomarie Longs.  History of Present Illness:   [name] is a 82 year old female with the above history who is followed by Dr. Diona Browner. She has a remote history of CABG x2 with LIMA to LAD and SVG to RCA as well as AVR in 2009. Last ischemic evaluation was a Myoview in 12/2017 which was was low risk with no significant ischemia. Last Echo in 11/2021 showed LVEF of 70-75% with a dynamic LVOT gradient created by hyperdynamic LV function and moderate symmetrical LVH. Also showed stable aortic valve with normal function and mild mitral stenosis/ regurgitation. She also has a history of diastolic CHF and persistent atrial fibrillation/ flutter. Patient was last seen by Dr. Diona Browner in 03/2022 at which time she was stable from a cardiac standpoint. She had NYHA Class II dyspnea with lowe level activity and reported improvement in lower extremity edema. Her weight was also down a few pounds.   Patient presented to [facility] on 06/26/2022 for further evaluation of acute onset of shortness of breath. EKG showed no acute ischemic changes. High-sensitivity troponin elevated at 301 >> 330 >> 361 >> 319 >> 283 >> 274. Pro BNP elevated at 2,056 >> 2,302 >> 1,132. Chest x-ray showed stable cardiomegaly with mild pulmonary vascular congestion and mild bibasilar and mid right  lung atelectasis. WBC 12, Hgb 11.1, Plts 223. Na 143, K 4.1, Glucose 155, BUN 63, Cr 2.44.  LFTs normal. Plan was to transfer her to the [facility] ED but there were no beds available for over 72 hours. She was diuresed while in the ED. Echo there showed LVEF of 65-70% with grade 2 diastolic dysfunction and stable aortic valve.  Upon arrival to [facility], patient is resting comfortably in no acute distress. Patient is not very active at baseline and states the most activity she gets is walking to and from the bathroom. She has chronic dyspnea on exertion at baseline and is wear 2 L of O2 at home. However, she states that she had gradual worsening shortness of breath for about the 4 days prior to admission. She was evening having shortness of breath at rest. She has slept in a recliner for years but states she was not sleeping well like this. She has chronic lower extremity edema but this has been well controlled on her current dose of Torsemide. There was mention of some mild chest discomfort from notes at [facility] but patient denies this to me. She states she has not had any chest pain. She does report that her heart has been beating irregularly but could not tell me how long this has been going on for. No lightheadedness, dizziness, or syncope. She denies any recent fevers or illness. She has some occasional sneezing and irriation from her nasal cannula but no other nasal congestion or cough. No  abdominal, nausea, or vomiting. She does states she has not had a bowel movement since arriving at [facility] (6 days now). No abnormal bleeding in urine or stool.  She feel like her breathing is back to baseline now.  Past Medical History:  Diagnosis Date   Aortic stenosis    Arthritis    Atrial fibrillation (HCC)    Coronary atherosclerosis of native coronary artery    Multivessel   Essential hypertension    Gout    Hypothyroidism    Mixed hyperlipidemia    Psoriasis     Past Surgical History:   Procedure Laterality Date   AORTIC VALVE REPLACEMENT  2009   21 mm Edwards Magna pericardial valve   CARDIOVERSION N/A 10/20/2020   Procedure: CARDIOVERSION;  Surgeon: [name], MD;  Location: [facility];  Service: Cardiovascular;  Laterality: N/A;   CHOLECYSTECTOMY     2011   CORONARY ARTERY BYPASS GRAFT  2009   LIMA to LAD, SVG to RCA   JOINT REPLACEMENT  2012   L TOTAL KNEE   TOTAL KNEE ARTHROPLASTY  09/16/2011   Procedure: TOTAL KNEE ARTHROPLASTY;  Surgeon: Loanne Drilling, MD;  Location: [facility];  Service: Orthopedics;  Laterality: Right;   TOTAL THYROIDECTOMY  1970     Home Medications:  Prior to Admission medications   Medication Sig Start Date End Date Taking? Authorizing Provider  acetaminophen (TYLENOL) 650 MG CR tablet Take 650 mg by mouth every 8 (eight) hours as needed for pain.   Yes [provider]  atorvastatin (LIPITOR) 20 MG tablet Take 20 mg by mouth daily. 11/13/21  Yes [provider]  docusate sodium (COLACE) 100 MG capsule Take 100 mg by mouth at bedtime.   Yes [provider]  ELIQUIS 2.5 MG TABS tablet TAKE 1 TABLET BY MOUTH TWICE DAILY 01/31/22  Yes [name], MD  empagliflozin (JARDIANCE) 10 MG TABS tablet Take 1 tablet (10 mg total) by mouth daily before breakfast. 03/12/22  Yes [name], MD  fenofibrate (TRICOR) 145 MG tablet Take 145 mg by mouth daily. 09/18/20  Yes [provider]  ferrous sulfate 325 (65 FE) MG EC tablet Take 325 mg by mouth at bedtime.   Yes [provider]  levothyroxine (SYNTHROID, LEVOTHROID) 125 MCG tablet Take 125 mcg by mouth daily before breakfast.   Yes [provider]  losartan (COZAAR) 25 MG tablet Take 0.5 tablets (12.5 mg total) by mouth daily. 01/28/22  Yes [name], MD  metolazone (ZAROXOLYN) 5 MG tablet TAKE 1 TABLET BY MOUTH ONCE WEEKLY ON WEDNESDAY (TAKE 1 EXTRA POTASSIUM TABLET WITH IT) 05/09/22  Yes [name], MD  metoprolol  succinate (TOPROL-XL) 25 MG 24 hr tablet Take 1 tablet (25 mg total) by mouth daily. 01/28/22  Yes [name], MD  pantoprazole (PROTONIX) 40 MG tablet Take 40 mg by mouth daily. 10/21/16  Yes [provider]  potassium chloride SA (KLOR-CON M) 20 MEQ tablet Take 1 tablet (20 mEq total) by mouth daily. Take 1 extra pill on Wednesday with metolazone 02/07/22  Yes [name], MD  torsemide (DEMADEX) 20 MG tablet Take 3 tablets (60 mg total) by mouth 2 (two) times daily. 03/12/22  Yes [name], MD  Vitamin D, Ergocalciferol, (DRISDOL) 50000 UNITS CAPS Take 50,000 Units by mouth every Sunday. 09/04/11  Yes [provider]  allopurinol (ZYLOPRIM) 300 MG tablet Take 300 mg by mouth daily. 10/10/16   [provider]  Multiple Vitamins-Minerals (CENTRUM SILVER PO) Take 1 tablet by mouth at bedtime. Patient not taking: Reported on 07/01/2022    [provider]    Inpatient Medications: Scheduled Meds:  allopurinol  300 mg Oral Daily   apixaban  2.5 mg Oral BID   atorvastatin  20 mg Oral Daily   docusate sodium  100 mg Oral BID   empagliflozin  10 mg Oral Daily   fenofibrate  160 mg Oral Daily   levothyroxine  125 mcg Oral Q0600   metoprolol succinate  25 mg Oral Daily   pantoprazole  40 mg Oral Daily   potassium chloride  40 mEq Oral Once   senna-docusate  1 tablet Oral BID   torsemide  60 mg Oral Daily   Continuous Infusions:  PRN Meds: acetaminophen, bisacodyl, ondansetron (ZOFRAN) IV  Allergies:    Allergies  Allergen Reactions   Statins     REACTION: MUSCLE ACHE   Penicillins Swelling and Rash    Social History:   Social History   Socioeconomic History   Marital status: Widowed    Spouse name: Not on file   Number of children: Not on file   Years of education: Not on file   Highest education level: Not on file  Occupational History   Occupation: Retired  Tobacco Use   Smoking status: Former    Packs/day: 0.50     Years: 15.00    Total pack years: 7.50    Types: Cigarettes    Quit date: 06/27/1989    Years since quitting: 33.0    Passive exposure: Never   Smokeless tobacco: Never  Vaping Use   Vaping Use: Never used  Substance and Sexual Activity   Alcohol use: No    Alcohol/week: 0.0 standard drinks of alcohol   Drug use: No   Sexual activity: Not on file  Other Topics Concern   Not on file  Social History Narrative   Not on file   Social Determinants of Health   Financial Resource Strain: Not on file  Food Insecurity: No Food Insecurity (06/30/2022)   Hunger Vital Sign    Worried About Running Out of Food in the Last Year: Never true    Ran Out of Food in the Last Year: Never true  Transportation Needs: No Transportation Needs (06/30/2022)   PRAPARE - Administrator, Civil Service (Medical): No    Lack of Transportation (Non-Medical): No  Physical Activity: Not on file  Stress: Not on file  Social Connections: Not on file  Intimate Partner Violence: Not At Risk (06/30/2022)   Humiliation, Afraid, Rape, and Kick questionnaire    Fear of Current or Ex-Partner: No    Emotionally Abused: No    Physically Abused: No    Sexually Abused: No    Family History:   Family History  Problem Relation Age of Onset   Aneurysm Father        Abdominal   Multiple sclerosis Sister      ROS:  Please see the history of present illness.  Review of Systems  Constitutional:  Negative for fever.  HENT:  Negative for congestion.   Respiratory:  Positive for shortness of breath. Negative for cough and hemoptysis.   Cardiovascular:  Positive for palpitations, orthopnea (chronic) and leg swelling (chronic). Negative for chest pain.  Gastrointestinal:  Positive for constipation. Negative for abdominal pain, blood in stool, melena, nausea and vomiting.  Genitourinary:  Negative for hematuria.  Musculoskeletal:  Negative  for myalgias.  Neurological:  Negative for dizziness and loss of  consciousness.  Endo/Heme/Allergies:  Does not bruise/bleed easily.  Psychiatric/Behavioral:  Substance abuse: remote tobacco use.     All other ROS reviewed and negative.     Physical Exam/Data:   Vitals:   06/30/22 2300 07/01/22 0046 07/01/22 0048 07/01/22 0519  BP:   (!) 139/35 (!) 122/48  Pulse: (!) 46   72  Resp:  14  14  Temp:  98.5 F (36.9 C)  98.2 F (36.8 C)  TempSrc:  Oral  Oral  SpO2:  97%  99%  Weight:      Height:        Intake/Output Summary (Last 24 hours) at 07/01/2022 1111 Last data filed at 07/01/2022 1047 Gross per 24 hour  Intake 0 ml  Output 2850 ml  Net -2850 ml      06/30/2022   10:55 PM 04/18/2022    2:51 PM 03/12/2022    2:50 PM  Last 3 Weights  Weight (lbs) 250 lb 10.6 oz 240 lb 3.2 oz 242 lb 9.6 oz  Weight (kg) 113.7 kg 108.954 kg 110.043 kg     Body mass index is 44.4 kg/m.  General: 82 y.o. morbidly obese Caucasian female resting comfortably in no acute distress. On 4L of O2 via nasal cannula. HEENT: Normocephalic and atraumatic. Sclera clear.  Neck: Supple. JVD difficult to assess due to body habitus. Heart: Irregular rhythm with normal rate. Distinct S1 and S2. No murmurs, gallops, or rubs. Radial pulses 2+ and equal bilaterally. Lungs: No increased work of breathing. Faint crackles noted in bilateral bases.  Abdomen: Soft, non-distended, and non-tender to palpation. Bowel sounds present. Extremities: No to trace lower extremity edema bilaterally.  Neuro: Alert and oriented x3. No focal deficits. Psych: Normal affect. Responds appropriately.   EKG:  The EKG was personally reviewed and demonstrates:  Sinus bradycardia, rate 50 bpm, with known RBBB and PVCs.  Telemetry:  Telemetry was personally reviewed and demonstrates:  Sinus rhythm with rates ranging from the mid 40s to 70s. Currently in the 70s. Frequent PACs/ PVCs.  Relevant CV Studies:  Myoview 12/23/2017: There was no ST segment deviation noted during stress. Sinus rhythm with  right bundle branch block and frequent PACs seen throughout study. Defect 1: There is a defect present in the mid anterior and apical anterior location. Images appear to be improved with stress consistent with reversed redistribution. There are no significant ischemic territories. This is a low risk study. Nuclear stress EF: 48%. _______________  Echocardiogram 06/28/2022: Summary: 1. The left ventricle is normal in size with mildly to moderately increased  wall thickness.  2. The left ventricular systolic function is normal, LVEF is visually  estimated at 65-70%.  3. There is grade II diastolic dysfunction (elevated filling pressure).  4. The mitral valve leaflets are mildly thickened with normal leaflet  mobility.  5. Mitral annular calcification is present.  6. Aortic valve replacement (21 mm bioprosthetic, implantation date: 2009).  7. Aortic valve Doppler indices are consistent with normal prosthetic valve  function.  8. The left atrium is moderately dilated in size.  9. The right ventricle is mildly dilated in size, with normal systolic  function.  10. There is mild pulmonary hypertension.  11. The right atrium is mildly dilated in size.    Laboratory Data:  High Sensitivity Troponin:   Recent Labs  Lab 07/01/22 0028 07/01/22 0206  TROPONINIHS 56* 14*     Chemistry Recent Labs  Lab 07/01/22 0945  NA 139  K 3.4*  CL 87*  CO2 39*  GLUCOSE 100*  BUN 61*  CREATININE 2.40*  CALCIUM 8.8*  GFRNONAA 20*  ANIONGAP 13    Recent Labs  Lab 07/01/22 0945  PROT 6.1*  ALBUMIN 3.1*  AST 24  ALT 10  ALKPHOS 40  BILITOT 0.5   Lipids No results for input(s): "CHOL", "TRIG", "HDL", "LABVLDL", "LDLCALC", "CHOLHDL" in the last 168 hours.  Hematology Recent Labs  Lab 07/01/22 0945  WBC 8.5  RBC 2.97*  HGB 10.0*  HCT 31.9*  MCV 107.4*  MCH 33.7  MCHC 31.3  RDW 14.0  PLT 163   Thyroid No results for input(s): "TSH", "FREET4" in the last 168 hours.  BNP Recent  Labs  Lab 07/01/22 0206  BNP 458.2*    DDimer  Recent Labs  Lab 07/01/22 0206  DDIMER 0.49     Radiology/Studies:  No results found.   Assessment and Plan:   Acute on Chronic HFpEF Chronic Hypoxic Respiratory Failure Patient presented with acute shortness of breath as described above. Pro BNP elevated at 2,056 >> 2,302 >> 1,132 at [place]. Chest x-ray showed stable cardiomegaly with mild pulmonary vascular congestion and mild bibasilar and mid right lung atelectasis. Echo there showed LVEF of 65-70% with grade 2 diastolic dysfunction and stable aortic valve. She was diuresed with IV Lasix at [facility]. BNP on arrival to [facility] 458. - She has faint crackles noted in bilateral bases (possible atelectasis) but otherwise does not appear grossly volume overloaded. She is currently on 4 L of O2 (on 2 L all the time at home). - She had another Echo ordered but I will cancel this since she had one at [facility]. - Creatinine peaked at 3.4 yesterday (baseline around 1.5 to 1.9) but trending back down - 2.44 today. OK to continue Torsemide 60mg  daily for now (on 60mg  twice daily at home) but will need to monitor renal function closely. - OK to continue Jardiance 10mg  daily given she was on this at home (discussed this with Pharmacy). - Agree with holding home Losartan given renal function. - Continue to monitor daily weights, strict I/Os, and renal function.  CAD s/p CABG Elevated Troponin. History of CAD s/p CABG x2 in 2009. Myoview in 12/2017 was low risk with no significant ischemia. High-sensitivity mildly elevated but flat. Peaked at 360 at [facility] and then down-trended. Repeat high-sensitivity troponin here at [facility] 56 >> 54.  Echo normal LV function. - Patient denies any chest pain during or prior to admission. - Not on aspirin due to DOAC. - Continue beta-blocker and statin. - Troponin trend is not consistent with ACS. Consistent with demand ischemia likely  secondary to acute on chronic CHF. Given renal function and the fact that she is chest pain free, I would not recommend cardiac catheterization. Could consider repeat Myoview but may be better to do this as an outpatient.  Persistent Atrial Fibrillation/ Flutter Maintaining sinus rhythm at this time with frequent PACs/ PVCs. - Continue Toprol-XL 25mg  daily. - Continue chronic anticoagulation with Eliquis 2.5mg  twice daily (reduced dose due to age and renal function).  Aortic Stenosis s/p AVR S/p bioprosthetic AVR in 2009 at time of CABG. Echo at [facility] showed stable valve with mean gradient of 155 mmHg. - Can continue to follow as an outpatient.  Hypertension BP elevated at time but most recently well controlled. - Continue Toprol-XL 25mg  daily.  Hyperlipidemia - Continue home Lipitor 20mg   daily for now. - Will recheck fasting lipid panel tomorrow.  Acute on CKD Stage IV Creatinine 2.44 on admission. Baseline 1.5 to 1.9. Creatinine initially improved with diuresis back to 1.9 range. However, then peaked at 3.25.  Otherwise, per primary team: - Hypothyroidism - GERD - Macrocytic anemia  Risk Assessment/Risk Scores:    New York Heart Association (NYHA) Functional Class NYHA Class III  CHA2DS2-VASc Score = 6  This indicates a 9.7% annual risk of stroke. The patient's score is based upon: CHF History: 1 HTN History: 1 Diabetes History: 0 Stroke History: 0 Vascular Disease History: 1 Age Score: 2 Gender Score: 1    For questions or updates, please contact [facility] Please consult [email or web address] for contact info under    Signed, Corrin Parker, PA-C  07/01/2022 11:11 AM

## 2022-07-01 NOTE — Progress Notes (Signed)
PROGRESS NOTE    [name]  ZOX:[id number] DOB: 11/08/1940 DOA: 06/30/2022 PCP: Glenda Chroman, MD  81/F with history of CAD/CABG, chronic diastolic CHF, COPD/chronic respiratory failure on 2 L home O2 persistent atrial flutter/fibrillation presented to [facility] [place] on 1/3 with chest pain, shortness of breath, she was noted to have an elevated troponin which peaked at 361, BNP of 2056, chest x-ray with pulmonary vascular congestion, was treated with diuretics, there was a concern for NSTEMI at [facility], cardiology was consulted and she was transferred to [facility] service for further evaluation. Unfortunately she ended up staying in the ER for 4 days until she got a bed and got transferred last night.  She was diuresed, had an echocardiogram which noted EF of 65% with grade 2 diastolic dysfunction, normal RV.  Subjective: -This morning denies any chest pain or shortness of breath  Assessment and Plan:  Acute on chronic diastolic CHF -Diuresed with IV Lasix at [facility] ER, appears close to euvolemic -Echo 1/5 with EF of 81%, grade 2 diastolic dysfunction, normal RV -Continue torsemide, Jardiance -Discontinue Foley catheter Increase activity, PT eval  Chest pain, elevated troponins CAD/CABG -Transferred for evaluation of this, suspect this is likely demand ischemia and not ACS -Await cards input -Continue metoprolol, statin, on Eliquis which is continued  Chronic respiratory failure?  COPD -On 2 L home O2 at baseline  Persistent atrial flutter/fibrillation -Continue metoprolol and Eliquis  CKD 3b -Labs pending today -Baseline creatinine around 1.6  Obesity  DVT prophylaxis: Eliquis Code Status: Full code Family Communication: None present Disposition Plan: Home in 1 to 2 days  Consultants:    Procedures:   Antimicrobials:    Objective: Vitals:   06/30/22 2300 07/01/22 0046 07/01/22 0048 07/01/22 0519  BP:   (!) 139/35 (!) 122/48  Pulse: (!) 46   72  Resp:  14  14   Temp:  98.5 F (36.9 C)  98.2 F (36.8 C)  TempSrc:  Oral  Oral  SpO2:  97%  99%  Weight:      Height:        Intake/Output Summary (Last 24 hours) at 07/01/2022 1914 Last data filed at 07/01/2022 0500 Gross per 24 hour  Intake --  Output 1250 ml  Net -1250 ml   Filed Weights   06/30/22 2255  Weight: 113.7 kg    Examination:  General exam: Obese pleasant female sitting up in bed, AAOx3, no distress HEENT: Neck obese unable to assess JVD CVS: S1-S2, regular rhythm Lungs: Decreased breath sounds at bases otherwise clear Abdomen: Soft, nontender, bowel sounds present Extremities: No edema  Skin: No rashes on exposed skin Psychiatry:  Mood & affect appropriate.     Data Reviewed:   CBC: No results for input(s): "WBC", "NEUTROABS", "HGB", "HCT", "MCV", "PLT" in the last 168 hours. Basic Metabolic Panel: No results for input(s): "NA", "K", "CL", "CO2", "GLUCOSE", "BUN", "CREATININE", "CALCIUM", "MG", "PHOS" in the last 168 hours. GFR: CrCl cannot be calculated (Patient's most recent lab result is older than the maximum 21 days allowed.). Liver Function Tests: No results for input(s): "AST", "ALT", "ALKPHOS", "BILITOT", "PROT", "ALBUMIN" in the last 168 hours. No results for input(s): "LIPASE", "AMYLASE" in the last 168 hours. No results for input(s): "AMMONIA" in the last 168 hours. Coagulation Profile: No results for input(s): "INR", "PROTIME" in the last 168 hours. Cardiac Enzymes: No results for input(s): "CKTOTAL", "CKMB", "CKMBINDEX", "TROPONINI" in the last 168 hours. BNP (last 3 results) No results for input(s): "  PROBNP" in the last 8760 hours. HbA1C: No results for input(s): "HGBA1C" in the last 72 hours. CBG: No results for input(s): "GLUCAP" in the last 168 hours. Lipid Profile: No results for input(s): "CHOL", "HDL", "LDLCALC", "TRIG", "CHOLHDL", "LDLDIRECT" in the last 72 hours. Thyroid Function Tests: No results for input(s): "TSH", "T4TOTAL", "FREET4",  "T3FREE", "THYROIDAB" in the last 72 hours. Anemia Panel: No results for input(s): "VITAMINB12", "FOLATE", "FERRITIN", "TIBC", "IRON", "RETICCTPCT" in the last 72 hours. Urine analysis:    Component Value Date/Time   COLORURINE STRAW (A) 10/24/2020 2211   APPEARANCEUR CLEAR 10/24/2020 2211   LABSPEC 1.006 10/24/2020 2211   PHURINE 5.0 10/24/2020 2211   GLUCOSEU NEGATIVE 10/24/2020 2211   HGBUR NEGATIVE 10/24/2020 2211   BILIRUBINUR NEGATIVE 10/24/2020 2211   KETONESUR NEGATIVE 10/24/2020 2211   PROTEINUR NEGATIVE 10/24/2020 2211   UROBILINOGEN 0.2 09/06/2011 1417   NITRITE NEGATIVE 10/24/2020 2211   LEUKOCYTESUR NEGATIVE 10/24/2020 2211   Sepsis Labs: @LABRCNTIP (procalcitonin:4,lacticidven:4)  )No results found for this or any previous visit (from the past 240 hour(s)).   Radiology Studies: No results found.   Scheduled Meds:  allopurinol  300 mg Oral Daily   apixaban  2.5 mg Oral BID   atorvastatin  20 mg Oral Daily   docusate sodium  100 mg Oral BID   empagliflozin  10 mg Oral Daily   fenofibrate  160 mg Oral Daily   levothyroxine  125 mcg Oral Q0600   metoprolol succinate  25 mg Oral Daily   pantoprazole  40 mg Oral Daily   senna-docusate  1 tablet Oral BID   torsemide  60 mg Oral Daily   Continuous Infusions:   LOS: 1 day    Time spent:    , MD [facility]   07/01/2022, 9:59 AM

## 2022-07-01 NOTE — TOC Progression Note (Addendum)
Transition of Care [facility]) - Progression Note    Patient Details  Name: [name] MRN: [id number] Date of Birth: 1940-11-23  Transition of Care [facility]) CM/SW Contact  Zenon Mayo, RN Phone Number: 07/01/2022, 7:48 PM  Clinical Narrative:    From [facility] at [facility], concern for NSTEMI,CHF, Cards consulted, NPO, conts on IV lasix. TOC following.        Expected Discharge Plan and Services                                               Social Determinants of Health (SDOH) Interventions SDOH Screenings   Food Insecurity: No Food Insecurity (06/30/2022)  Housing: Low Risk  (06/30/2022)  Transportation Needs: No Transportation Needs (06/30/2022)  Utilities: Not At Risk (06/30/2022)  Alcohol Screen: Low Risk  (07/01/2022)  Financial Resource Strain: Low Risk  (07/01/2022)  Tobacco Use: Medium Risk (07/01/2022)    Readmission Risk Interventions     No data to display

## 2022-07-01 NOTE — Progress Notes (Signed)
Heart Failure Nurse Navigator Progress Note  PCP: Ignatius Specking, MD PCP-Cardiologist: Diona Browner Admission Diagnosis: None Admitted from: [facility] via EMS  Presentation:   [name] presented with shortness of breath and chest pain, wears home O2 at 2-3 L, Troponin 301, BNP 2056, EKG showed RBBB, BP 160/33, CXR with pulmonary vascular congestion, IV lasix given,   Patient educated on the sign and symptoms of heart failure, daily weights, diet/ fluid restrictions, taking all medications as prescribed and attending all medical appointments. Patient verbalized her understanding, a hospital HF TOC appointment was scheduled for 07/15/2022.   ECHO/ LVEF: 65%  Clinical Course:  Past Medical History:  Diagnosis Date   Aortic stenosis    Arthritis    Atrial fibrillation (HCC)    Coronary atherosclerosis of native coronary artery    Multivessel   Essential hypertension    Gout    Hypothyroidism    Mixed hyperlipidemia    Psoriasis      Social History   Socioeconomic History   Marital status: Widowed    Spouse name: Not on file   Number of children: Not on file   Years of education: Not on file   Highest education level: High school graduate  Occupational History   Occupation: Retired  Tobacco Use   Smoking status: Former    Packs/day: 0.50    Years: 15.00    Total pack years: 7.50    Types: Cigarettes    Quit date: 06/27/1989    Years since quitting: 33.0    Passive exposure: Never   Smokeless tobacco: Never  Vaping Use   Vaping Use: Never used  Substance and Sexual Activity   Alcohol use: No    Alcohol/week: 0.0 standard drinks of alcohol   Drug use: No   Sexual activity: Not on file  Other Topics Concern   Not on file  Social History Narrative   Not on file   Social Determinants of Health   Financial Resource Strain: Low Risk  (07/01/2022)   Overall Financial Resource Strain (CARDIA)    Difficulty of Paying Living Expenses: Not very hard  Food  Insecurity: No Food Insecurity (06/30/2022)   Hunger Vital Sign    Worried About Running Out of Food in the Last Year: Never true    Ran Out of Food in the Last Year: Never true  Transportation Needs: No Transportation Needs (06/30/2022)   PRAPARE - Administrator, Civil Service (Medical): No    Lack of Transportation (Non-Medical): No  Physical Activity: Not on file  Stress: Not on file  Social Connections: Not on file   Education Assessment and Provision:  Detailed education and instructions provided on heart failure disease management including the following:  Signs and symptoms of Heart Failure When to call the physician Importance of daily weights Low sodium diet Fluid restriction Medication management Anticipated future follow-up appointments  Patient education given on each of the above topics.  Patient acknowledges understanding via teach back method and acceptance of all instructions.  Education Materials:  "Living Better With Heart Failure" Booklet, HF zone tool, & Daily Weight Tracker Tool.  Patient has scale at home: yes Patient has pill box at home: yes    High Risk Criteria for Readmission and/or Poor Patient Outcomes: Heart failure hospital admissions (last 6 months): 0  No Show rate: 0 Difficult social situation: No Demonstrates medication adherence: Yes Primary Language: English Literacy level: reading, writing, and comprehension  Barriers of Care:  Continued HF education Diet/ fluids/ daily weights  Considerations/Referrals:   Referral made to Heart Failure Pharmacist Stewardship: yes Referral made to Heart Failure CSW/NCM TOC: No Referral made to Heart & Vascular TOC clinic: yes, 07/15/2022 ( per Dr. Broadus John)  Items for Follow-up on DC/TOC: Continued HF Education Diet/ fluids/daily weights   Earnestine Leys, BSN, RN Heart Failure Leisure centre manager Chat Only

## 2022-07-02 DIAGNOSIS — J9611 Chronic respiratory failure with hypoxia: Secondary | ICD-10-CM | POA: Diagnosis not present

## 2022-07-02 DIAGNOSIS — I2510 Atherosclerotic heart disease of native coronary artery without angina pectoris: Secondary | ICD-10-CM | POA: Diagnosis not present

## 2022-07-02 DIAGNOSIS — I5033 Acute on chronic diastolic (congestive) heart failure: Secondary | ICD-10-CM | POA: Diagnosis not present

## 2022-07-02 DIAGNOSIS — R0789 Other chest pain: Secondary | ICD-10-CM | POA: Diagnosis not present

## 2022-07-02 DIAGNOSIS — I4819 Other persistent atrial fibrillation: Secondary | ICD-10-CM | POA: Diagnosis not present

## 2022-07-02 LAB — CBC
HCT: 30.7 % — ABNORMAL LOW (ref 36.0–46.0)
Hemoglobin: 10.1 g/dL — ABNORMAL LOW (ref 12.0–15.0)
MCH: 34.6 pg — ABNORMAL HIGH (ref 26.0–34.0)
MCHC: 32.9 g/dL (ref 30.0–36.0)
MCV: 105.1 fL — ABNORMAL HIGH (ref 80.0–100.0)
Platelets: 177 10*3/uL (ref 150–400)
RBC: 2.92 MIL/uL — ABNORMAL LOW (ref 3.87–5.11)
RDW: 14 % (ref 11.5–15.5)
WBC: 8.1 10*3/uL (ref 4.0–10.5)
nRBC: 0 % (ref 0.0–0.2)

## 2022-07-02 LAB — BASIC METABOLIC PANEL
Anion gap: 11 (ref 5–15)
BUN: 56 mg/dL — ABNORMAL HIGH (ref 8–23)
CO2: 42 mmol/L — ABNORMAL HIGH (ref 22–32)
Calcium: 8.8 mg/dL — ABNORMAL LOW (ref 8.9–10.3)
Chloride: 86 mmol/L — ABNORMAL LOW (ref 98–111)
Creatinine, Ser: 1.81 mg/dL — ABNORMAL HIGH (ref 0.44–1.00)
GFR, Estimated: 28 mL/min — ABNORMAL LOW (ref >60)
Glucose, Bld: 104 mg/dL — ABNORMAL HIGH (ref 70–99)
Potassium: 3.7 mmol/L (ref 3.5–5.1)
Sodium: 139 mmol/L (ref 135–145)

## 2022-07-02 LAB — LIPID PANEL
Cholesterol: 164 mg/dL (ref 0–200)
HDL: 33 mg/dL — ABNORMAL LOW (ref >40)
LDL Cholesterol: 85 mg/dL (ref 0–99)
Total CHOL/HDL Ratio: 5 RATIO
Triglycerides: 232 mg/dL — ABNORMAL HIGH (ref <150)
VLDL: 46 mg/dL — ABNORMAL HIGH (ref 0–40)

## 2022-07-02 MED ORDER — POTASSIUM CHLORIDE CRYS ER 20 MEQ PO TBCR
40.0000 meq | EXTENDED_RELEASE_TABLET | Freq: Once | ORAL | Status: AC
  Administered 2022-07-02: 40 meq via ORAL
  Filled 2022-07-02: qty 2

## 2022-07-02 MED ORDER — LIDOCAINE 5 % EX PTCH
2.0000 | MEDICATED_PATCH | CUTANEOUS | Status: DC
  Administered 2022-07-02 – 2022-07-06 (×4): 2 via TRANSDERMAL
  Filled 2022-07-02 (×4): qty 2

## 2022-07-02 MED ORDER — POLYETHYLENE GLYCOL 3350 17 G PO PACK
17.0000 g | PACK | Freq: Every day | ORAL | Status: DC
  Administered 2022-07-03 – 2022-07-06 (×4): 17 g via ORAL
  Filled 2022-07-02 (×3): qty 1

## 2022-07-02 MED ORDER — POTASSIUM CHLORIDE CRYS ER 20 MEQ PO TBCR
20.0000 meq | EXTENDED_RELEASE_TABLET | Freq: Once | ORAL | Status: AC
  Administered 2022-07-02: 20 meq via ORAL
  Filled 2022-07-02: qty 1

## 2022-07-02 NOTE — Progress Notes (Signed)
   Heart Failure Stewardship Pharmacist Progress Note   PCP: Glenda Chroman, MD PCP-Cardiologist: Rozann Lesches, MD    HPI:  82 yo F with PMH of CAD s/p CABG and AVR in 2009, CHF, CKD IV, COPD, and afib.  She presented to [facility] on 1/3 with chest pain and shortness of breath. Found to have elevated troponin and BNP. CXR with pulmonary vascular congestion. Recived IV diuretics. ECHO showed LVEF 65% with G2DD and normal RV. Transferred to [facility] on 1/7 for further evaluation. No plan for LHC with renal dysfunction.   Current HF Medications: Diuretic: furosemide 80 mg IV BID Beta Blocker: metoprolol XL 25 mg daily SGLT2i: Jardiance 10 mg daily  Prior to admission HF Medications: Diuretic: torsemide 60 mg daily + metolazone 5 mg once weekly Beta blocker: metoprolol XL 25 mg daily ACE/ARB/ARNI: losartan 12.5 mg daily SGLT2i: Jardiance 10 mg daily  Pertinent Lab Values: Serum creatinine 1.81, BUN 56, Potassium 3.7, Sodium 139, BNP 2302>>458.2  Vital Signs: Weight: 235 lbs  Blood pressure: 160/50s Heart rate: 60-70s  I/O: -3.3L yesterday; net since arrival to [facility] -3.7L  Medication Assistance / Insurance Benefits Check: Does the patient have prescription insurance?  Yes Type of insurance plan: [place] [employer]  Outpatient Pharmacy:  Prior to admission outpatient pharmacy: [facility] Is the patient willing to use [facility] at discharge? Yes Is the patient willing to transition their outpatient pharmacy to utilize a [facility] outpatient pharmacy?   Pending   Assessment: 1. Acute on chronic diastolic CHF (LVEF 07%). NYHA class III symptoms. - Continue furosemide 80 mg IV BID. Strict I/Os and daily weights. Keep K>4, replacing with 60 mEq total today. Check magnesium in AM. - Continue metoprolol XL 25 mg daily - Consider resuming losartan prior to discharge once creatinine stabilizes.  - Continue Jardiance 10 mg daily. eGFR 28.   Plan: 1) Medication changes  recommended at this time: - Check magnesium in AM  2) Patient assistance: - None pending  3)  Education  - To be completed prior to discharge  Kerby Nora, PharmD, BCPS Heart Failure Stewardship Pharmacist Phone [phone]

## 2022-07-02 NOTE — Progress Notes (Signed)
Rounding Note    Patient Name: [name] Date of Encounter: 07/02/2022  [facility] Cardiologist: [name], MD   Subjective   She slept at a low incline last night well.   On home O2.  Net negative 2.9L Total 4.1L Wt close to baseline Renal fxn at baseline   Inpatient Medications    Scheduled Meds:  allopurinol  300 mg Oral Daily   apixaban  2.5 mg Oral BID   atorvastatin  20 mg Oral Daily   empagliflozin  10 mg Oral Daily   fenofibrate  160 mg Oral Daily   furosemide  80 mg Intravenous BID   levothyroxine  125 mcg Oral Q0600   metoprolol succinate  25 mg Oral Daily   pantoprazole  40 mg Oral Daily   polyethylene glycol  17 g Oral Daily   potassium chloride  20 mEq Oral Once   potassium chloride  40 mEq Oral Once   senna-docusate  1 tablet Oral BID   Continuous Infusions:  PRN Meds: acetaminophen, bisacodyl, bisacodyl, melatonin, ondansetron (ZOFRAN) IV, mouth rinse   Vital Signs    Vitals:   07/01/22 0519 07/01/22 1940 07/02/22 0345 07/02/22 0500  BP: (!) 122/48 (!) 145/60 (!) 162/53   Pulse: 72 71 63   Resp: 14 15 20    Temp: 98.2 F (36.8 C) 97.7 F (36.5 C) 97.6 F (36.4 C)   TempSrc: Oral Oral Oral   SpO2: 99% 100% 94%   Weight:    107 kg  Height:        Intake/Output Summary (Last 24 hours) at 07/02/2022 0940 Last data filed at 07/02/2022 0809 Gross per 24 hour  Intake 660 ml  Output 3150 ml  Net -2490 ml      07/02/2022    5:00 AM 06/30/2022   10:55 PM 04/18/2022    2:51 PM  Last 3 Weights  Weight (lbs) 235 lb 12.8 oz 250 lb 10.6 oz 240 lb 3.2 oz  Weight (kg) 106.958 kg 113.7 kg 108.954 kg      Telemetry    Sinus, PACs - Personally Reviewed  ECG    NSR, RBBB - Personally Reviewed  Physical Exam   Vitals:   07/01/22 1940 07/02/22 0345  BP: (!) 145/60 (!) 162/53  Pulse: 71 63  Resp: 15 20  Temp: 97.7 F (36.5 C) 97.6 F (36.4 C)  SpO2: 100% 94%    GEN: No acute distress.   Neck: No sig JVD Cardiac:  RRR, no murmurs, rubs, or gallops.  Respiratory: nl wob, decreased BS with some rales GI: Soft, nontender, non-distended  MS: No edema; No deformity. Neuro:  Nonfocal  Psych: Normal affect   Labs    High Sensitivity Troponin:   Recent Labs  Lab 07/01/22 0028 07/01/22 0206  TROPONINIHS 56* 54*     Chemistry Recent Labs  Lab 07/01/22 0945 07/02/22 0058  NA 139 139  K 3.4* 3.7  CL 87* 86*  CO2 39* 42*  GLUCOSE 100* 104*  BUN 61* 56*  CREATININE 2.40* 1.81*  CALCIUM 8.8* 8.8*  PROT 6.1*  --   ALBUMIN 3.1*  --   AST 24  --   ALT 10  --   ALKPHOS 40  --   BILITOT 0.5  --   GFRNONAA 20* 28*  ANIONGAP 13 11    Lipids  Recent Labs  Lab 07/02/22 0058  CHOL 164  TRIG 232*  HDL 33*  LDLCALC 85  CHOLHDL 5.0  Hematology Recent Labs  Lab 07/01/22 0945 07/02/22 0058  WBC 8.5 8.1  RBC 2.97* 2.92*  HGB 10.0* 10.1*  HCT 31.9* 30.7*  MCV 107.4* 105.1*  MCH 33.7 34.6*  MCHC 31.3 32.9  RDW 14.0 14.0  PLT 163 177   Thyroid No results for input(s): "TSH", "FREET4" in the last 168 hours.  BNP Recent Labs  Lab 07/01/22 0206  BNP 458.2*    DDimer  Recent Labs  Lab 07/01/22 0206  DDIMER 0.49     Radiology    No results found.  Cardiac Studies   TTE 11/27/2021   1. There is a dynamic LVOT gradient created by hyperdynamic LV function  and moderate symmetrical LVH. Resting peak gradient 46 mmHg. Valsalva was  not performed. . Left ventricular ejection fraction, by estimation, is 70  to 75%. The left ventricle has  hyperdynamic function. Left ventricular endocardial border not optimally  defined to evaluate regional wall motion. There is moderate left  ventricular hypertrophy. Left ventricular diastolic parameters are  indeterminate. Elevated left atrial pressure.   2. Right ventricular systolic function is normal. The right ventricular  size is normal. Tricuspid regurgitation signal is inadequate for assessing  PA pressure.   3. Left atrial size was  moderately dilated.   4. The mitral valve is abnormal. Mild mitral valve regurgitation. Mild  mitral stenosis.   5. 21 mm Edwards Magna pericardial valve is in the AV position with  normal function. The aortic valve has been repaired/replaced. There is  mild calcification of the aortic valve. There is mild thickening of the  aortic valve. Aortic valve regurgitation  is not visualized. No aortic stenosis is present.   6. The inferior vena cava is normal in size with greater than 50%  respiratory variability, suggesting right atrial pressure of 3 mmHg.   Patient Profile     KLEIGH HOELZER is a 82 y.o. female with a history of CAD s/p CABG x2 (LIMA to LAD and SVG to RCA) in 2009, chronic HFpEF, persistent atrial fibrillation/ flutter on Eliquis, aortic stenosis s/p bioprosthetic AVR in 2009 at time of CABG, hypertension, hyperlipidemia, CKD stage IV, and hypothyroidism following thyroidectomy in 1970, who is being seen for the evaluation of chest pain at the request of Dr. Jomarie Longs.     Assessment & Plan    HfpEF: EF normal - hypervolemic - weight 113kg->107 kg (109 kg in August) - Her renal function has improved (3.4->2.4-> 1.8)(baseline around 1.5 to 1.9)  - on home O2 2L -continue 80 mg IV lasix BID at least one more day, then transition back to torsemide soon continue potassium supplementation with ectopy. Goal K>4, Mg>2  Strict I/O, daily weights -continue jardiance 10 mg daily - OOB to chair, continue PT with ambulation  DOE - no signs of ACS -no plans for ischemic eval  AVR Her AVR gradients are stable from June 13 mmHg to 15 mmHg. Normal DI. Normal prosthesis. Placed in 2009 and only has 21 mm valve, at risk for PPM considering her weight, BMI 44.   HTN - cont metop XL 25 mg daily -resume losartan upon DC once crt stablizes  CAD: CABG x2 with LIMA to LAD and SVG to RCA  -continue atorvastatin 20 mg daily  HLD -continue fenofibrate 160 mg daily  Thyroid dx -on synthroid  125 mcg daily  PAF/aflutter -continue eliquis    At least one more day of IV lasix, then back to orals. Once on oral, cardiology will sign off  For questions or updates, please contact [facility] Please consult [email or web address] for contact info under        Signed, Janina Mayo, MD  07/02/2022, 9:40 AM

## 2022-07-02 NOTE — TOC Progression Note (Signed)
Transition of Care [facility]) - Progression Note    Patient Details  Name: [name] MRN: [id number] Date of Birth: 1940/07/04  Transition of Care [facility]) CM/SW Contact  Zenon Mayo, RN Phone Number: 07/02/2022, 12:26 PM  Clinical Narrative:    NCM offered choice to patient for [place], [place], with Medicare . Gov list , she is ok with [place].  Her friend that lives with her is at the bedside also.  Patient is on 2 liters oxygen at home with [facility].  NCM made referral to [facility] with Alvis Lemmings, he is able to take referral.  Soc will begin 24 to 48 hrs post dc.     Expected Discharge Plan: [place] Barriers to Discharge: Continued Medical Work up  Expected Discharge Plan and Services     Post Acute Care Choice: [place] arrangements for the past 2 months: Single Family Home                 DME Arranged: N/A         HH Arranged: PT, OT HH Agency: [place] Date [place]: 07/02/22 Time [place]: 1224 Representative spoke with at [place]: [place] Determinants of Health ([place]) Interventions SDOH Screenings   Food Insecurity: No Food Insecurity (06/30/2022)  Housing: Low Risk  (06/30/2022)  Transportation Needs: No Transportation Needs (06/30/2022)  Utilities: Not At Risk (06/30/2022)  Alcohol Screen: Low Risk  (07/01/2022)  Financial Resource Strain: Low Risk  (07/01/2022)  Tobacco Use: Medium Risk (07/01/2022)    Readmission Risk Interventions     No data to display

## 2022-07-02 NOTE — Progress Notes (Addendum)
PROGRESS NOTE    [name]  KDT:[id number] DOB: 03-04-1941 DOA: 06/30/2022 PCP: Ignatius Specking, MD  81/F with history of CAD/CABG, chronic diastolic CHF, COPD/chronic respiratory failure on 2 L home O2 persistent atrial flutter/fibrillation presented to [facility] on 1/3 with chest pain, shortness of breath, she was noted to have an elevated troponin which peaked at 361, BNP of 2056, chest x-ray with pulmonary vascular congestion, was treated with diuretics, there was a concern for NSTEMI at [facility], cardiology was consulted and she was transferred to [facility] service for further evaluation. Unfortunately she ended up staying in the ER for 4 days until she got a bed and got transferred last night.  She was diuresed, had an echocardiogram which noted EF of 65% with grade 2 diastolic dysfunction, normal RV.  Subjective: -Feels good, breathing improving, denies any chest pain  Assessment and Plan:  Acute on chronic diastolic CHF -Echo 1/5 with EF of 65%, grade 2 diastolic dysfunction, normal RV -Diuresed at outside hospital prior to transfer, volume status improving, continue IV Lasix 1 more day, continue Jardiance, she is 4L neg in 24h -Increase activity, PT eval  Chest pain, elevated troponins CAD/CABG -Transferred for evaluation of this, suspect this is likely demand ischemia and not ACS -Appreciate cards input -Continue metoprolol, statin, on Eliquis which is continued  Chronic respiratory failure?  COPD -On 2 L home O2 at baseline  Persistent atrial flutter/fibrillation -Continue metoprolol and Eliquis  AKI on CKD 3b -Creatinine on admission was 3.4, this is improving, down to 1.8 today, likely cardiorenal syndrome -Baseline creatinine around 1.6  Obesity  DVT prophylaxis: Eliquis Code Status: Full code Family Communication: None present Disposition Plan: Home in 1 to 2 days  Consultants: Cardiology   Procedures:   Antimicrobials:    Objective: Vitals:    07/01/22 0519 07/01/22 1940 07/02/22 0345 07/02/22 0500  BP: (!) 122/48 (!) 145/60 (!) 162/53   Pulse: 72 71 63   Resp: 14 15 20    Temp: 98.2 F (36.8 C) 97.7 F (36.5 C) 97.6 F (36.4 C)   TempSrc: Oral Oral Oral   SpO2: 99% 100% 94%   Weight:    107 kg  Height:        Intake/Output Summary (Last 24 hours) at 07/02/2022 1142 Last data filed at 07/02/2022 0809 Gross per 24 hour  Intake 660 ml  Output 1550 ml  Net -890 ml   Filed Weights   06/30/22 2255 07/02/22 0500  Weight: 113.7 kg 107 kg    Examination:  General exam: Obese pleasant chronically ill female sitting up in recliner, AAOx3 HEENT: Neck obese unable to assess JVD CVS: S1-S2, regular rhythm Lungs: Decreased breath sounds at the bases Abdomen: Soft, nontender, bowel sounds present Extremities: No edema Skin: No rashes on exposed skin Psychiatry:  Mood & affect appropriate.     Data Reviewed:   CBC: Recent Labs  Lab 07/01/22 0945 07/02/22 0058  WBC 8.5 8.1  HGB 10.0* 10.1*  HCT 31.9* 30.7*  MCV 107.4* 105.1*  PLT 163 177   Basic Metabolic Panel: Recent Labs  Lab 07/01/22 0945 07/02/22 0058  NA 139 139  K 3.4* 3.7  CL 87* 86*  CO2 39* 42*  GLUCOSE 100* 104*  BUN 61* 56*  CREATININE 2.40* 1.81*  CALCIUM 8.8* 8.8*   GFR: Estimated Creatinine Clearance: 28.6 mL/min (A) (by C-G formula based on SCr of 1.81 mg/dL (H)). Liver Function Tests: Recent Labs  Lab 07/01/22 0945  AST 24  ALT 10  ALKPHOS 40  BILITOT 0.5  PROT 6.1*  ALBUMIN 3.1*   No results for input(s): "LIPASE", "AMYLASE" in the last 168 hours. No results for input(s): "AMMONIA" in the last 168 hours. Coagulation Profile: No results for input(s): "INR", "PROTIME" in the last 168 hours. Cardiac Enzymes: No results for input(s): "CKTOTAL", "CKMB", "CKMBINDEX", "TROPONINI" in the last 168 hours. BNP (last 3 results) No results for input(s): "PROBNP" in the last 8760 hours. HbA1C: No results for input(s): "HGBA1C" in the  last 72 hours. CBG: No results for input(s): "GLUCAP" in the last 168 hours. Lipid Profile: Recent Labs    07/02/22 0058  CHOL 164  HDL 33*  LDLCALC 85  TRIG 232*  CHOLHDL 5.0   Thyroid Function Tests: No results for input(s): "TSH", "T4TOTAL", "FREET4", "T3FREE", "THYROIDAB" in the last 72 hours. Anemia Panel: No results for input(s): "VITAMINB12", "FOLATE", "FERRITIN", "TIBC", "IRON", "RETICCTPCT" in the last 72 hours. Urine analysis:    Component Value Date/Time   COLORURINE STRAW (A) 10/24/2020 2211   APPEARANCEUR CLEAR 10/24/2020 2211   LABSPEC 1.006 10/24/2020 2211   PHURINE 5.0 10/24/2020 2211   GLUCOSEU NEGATIVE 10/24/2020 2211   HGBUR NEGATIVE 10/24/2020 2211   BILIRUBINUR NEGATIVE 10/24/2020 2211   KETONESUR NEGATIVE 10/24/2020 2211   PROTEINUR NEGATIVE 10/24/2020 2211   UROBILINOGEN 0.2 09/06/2011 1417   NITRITE NEGATIVE 10/24/2020 2211   LEUKOCYTESUR NEGATIVE 10/24/2020 2211   Sepsis Labs: @LABRCNTIP (procalcitonin:4,lacticidven:4)  )No results found for this or any previous visit (from the past 240 hour(s)).   Radiology Studies: No results found.   Scheduled Meds:  allopurinol  300 mg Oral Daily   apixaban  2.5 mg Oral BID   atorvastatin  20 mg Oral Daily   empagliflozin  10 mg Oral Daily   fenofibrate  160 mg Oral Daily   furosemide  80 mg Intravenous BID   levothyroxine  125 mcg Oral Q0600   metoprolol succinate  25 mg Oral Daily   pantoprazole  40 mg Oral Daily   polyethylene glycol  17 g Oral Daily   potassium chloride  20 mEq Oral Once   senna-docusate  1 tablet Oral BID   Continuous Infusions:   LOS: 2 days    Time spent: 15min    Domenic Polite, MD [facility]   07/02/2022, 11:42 AM

## 2022-07-02 NOTE — Evaluation (Signed)
Occupational Therapy Evaluation Patient Details Name: [name] MRN: [id number] DOB: [birth year] Today's Date: 07/02/2022   History of Present Illness Pt is a 82 y.o. F who presents to [facility] on 1/3 with chest pain, SOB, and noted to have elevated troponin and BNP of 2056. Chest x-ray with pulmonary vascular congestion. Concern for NSTEMI at [facility] and pt transferred to [facility] service for further evaluation. She was diuresed, had an echocardiogram which noted EF of 65% with grade 2 diastolic dysfunction, normal RV. Significant PMH: CAD/CABG, chronic diastolic CHF, COPD/chronic respiratory failure on 2 L home O2 persistent atrial flutter/fibrillation.   Clinical Impression   Pt reports independence at baseline with ADLs, uses RW for mobility, lives alone and is on 2LO2 at baseline. Pt currently needing min-max A for ADLs and min guard for transfers with RW. Pt VSS on 2L O2 throughout session. Pt presenting with impairments listed below, will follow acutely. Recommend HHOT at d/c.      Recommendations for follow up therapy are one component of a multi-disciplinary discharge planning process, led by the attending physician.  Recommendations may be updated based on patient status, additional functional criteria and insurance authorization.   Follow Up Recommendations  Home health OT     Assistance Recommended at Discharge Set up Supervision/Assistance  Patient can return home with the following A little help with bathing/dressing/bathroom;Assistance with cooking/housework;Assist for transportation;Help with stairs or ramp for entrance    Functional Status Assessment  Patient has had a recent decline in their functional status and demonstrates the ability to make significant improvements in function in a reasonable and predictable amount of time.  Equipment Recommendations  None recommended by OT (pt has all needed DME)    Recommendations for Other Services PT consult      Precautions / Restrictions Precautions Precautions: Fall;Other (comment) Precaution Comments: on 2L O2 at baseline Restrictions Weight Bearing Restrictions: No      Mobility Bed Mobility               General bed mobility comments: OOB in chair upon arrival and departure    Transfers Overall transfer level: Needs assistance Equipment used: Rolling walker (2 wheels) Transfers: Sit to/from Stand Sit to Stand: Min guard                  Balance Overall balance assessment: Needs assistance Sitting-balance support: Feet supported Sitting balance-Leahy Scale: Good     Standing balance support: Bilateral upper extremity supported Standing balance-Leahy Scale: Poor Standing balance comment: reliant on external support                           ADL either performed or assessed with clinical judgement   ADL Overall ADL's : Needs assistance/impaired Eating/Feeding: Supervision/ safety   Grooming: Supervision/safety;Wash/dry hands Grooming Details (indicate cue type and reason): washing hand standing at sink Upper Body Bathing: Minimal assistance   Lower Body Bathing: Moderate assistance   Upper Body Dressing : Minimal assistance   Lower Body Dressing: Moderate assistance   Toilet Transfer: Min guard;Ambulation;Regular Toilet;Rolling walker (2 wheels)   Toileting- Clothing Manipulation and Hygiene: Maximal assistance Toileting - Clothing Manipulation Details (indicate cue type and reason): pericare in standing     Functional mobility during ADLs: Min guard;Rolling walker (2 wheels)       Vision   Vision Assessment?: No apparent visual deficits     Perception Perception Perception Tested?: No   Praxis Praxis Praxis  tested?: Not tested    Pertinent Vitals/Pain Pain Assessment Pain Assessment: Faces Pain Score: 2  Faces Pain Scale: Hurts a little bit Pain Location: bottom from sitting Pain Descriptors / Indicators: Discomfort,  Grimacing, Guarding, Tender Pain Intervention(s): Limited activity within patient's tolerance, Monitored during session, Repositioned     Hand Dominance     Extremity/Trunk Assessment Upper Extremity Assessment Upper Extremity Assessment: Generalized weakness   Lower Extremity Assessment Lower Extremity Assessment: Defer to PT evaluation   Cervical / Trunk Assessment Cervical / Trunk Assessment: Kyphotic   Communication Communication Communication: No difficulties   Cognition Arousal/Alertness: Awake/alert Behavior During Therapy: WFL for tasks assessed/performed Overall Cognitive Status: Within Functional Limits for tasks assessed                                       General Comments  VSS on 2L O2    Exercises     Shoulder Instructions      Home Living Family/patient expects to be discharged to:: Private residence Living Arrangements: Alone Available Help at Discharge: Friend(s);Available PRN/intermittently Type of Home: House Home Access: Stairs to enter [employer] of Steps: 6 Entrance Stairs-Rails: Right;Left Home Layout: One level     Bathroom Shower/Tub: Producer, television/film/video: Handicapped height     Home Equipment: Agricultural consultant (2 wheels);Shower seat   Additional Comments: wears 2L O2 baseline      Prior Functioning/Environment Prior Level of Function : Independent/Modified Independent             Mobility Comments: hx of 1 fall within last 3 months. household ambulator, uses walker. sleeps in recliner. ADLs Comments: indep ADL's. pt reports she mainly does "sandwiches," for meals. unable to perform self-pericare, reports typically bathing after having BMs        OT Problem List: Decreased strength;Decreased range of motion;Impaired balance (sitting and/or standing);Decreased activity tolerance;Decreased safety awareness      OT Treatment/Interventions: Self-care/ADL training;Therapeutic  exercise;Energy conservation;DME and/or AE instruction;Therapeutic activities;Patient/family education;Balance training    OT Goals(Current goals can be found in the care plan section) Acute Rehab OT Goals Patient Stated Goal: none stated OT Goal Formulation: With patient Time For Goal Achievement: 07/16/22 Potential to Achieve Goals: Good ADL Goals Pt Will Perform Lower Body Bathing: with modified independence;sit to/from stand;sitting/lateral leans;with adaptive equipment Pt Will Perform Lower Body Dressing: with modified independence;sitting/lateral leans;sit to/from stand;with adaptive equipment Pt Will Transfer to Toilet: with modified independence;ambulating;regular height toilet Pt Will Perform Tub/Shower Transfer: Tub transfer;Shower transfer;with modified independence;ambulating;shower seat  OT Frequency: Min 2X/week    Co-evaluation              AM-PAC OT "6 Clicks" Daily Activity     Outcome Measure Help from another person eating meals?: None Help from another person taking care of personal grooming?: A Little Help from another person toileting, which includes using toliet, bedpan, or urinal?: A Little Help from another person bathing (including washing, rinsing, drying)?: A Lot Help from another person to put on and taking off regular upper body clothing?: A Little Help from another person to put on and taking off regular lower body clothing?: A Lot 6 Click Score: 17   End of Session Equipment Utilized During Treatment: Gait belt;Rolling walker (2 wheels) Nurse Communication: Mobility status  Activity Tolerance: Patient tolerated treatment well Patient left: with call bell/phone within reach;in chair;with chair alarm set  OT Visit  Diagnosis: Other abnormalities of gait and mobility (R26.89);Unsteadiness on feet (R26.81);Muscle weakness (generalized) (M62.81);History of falling (Z91.81)                Time: [id number] OT Time Calculation (min): 22 min Charges:  OT  General Charges $OT Visit: 1 Visit OT Evaluation $OT Eval Moderate Complexity: 1 Mod  Jye Fariss K, OTD, OTR/L SecureChat Preferred Acute Rehab [phone]   Desmund Elman K Koonce 07/02/2022, 10:02 AM

## 2022-07-03 DIAGNOSIS — I25119 Atherosclerotic heart disease of native coronary artery with unspecified angina pectoris: Secondary | ICD-10-CM | POA: Diagnosis not present

## 2022-07-03 DIAGNOSIS — I4819 Other persistent atrial fibrillation: Secondary | ICD-10-CM | POA: Diagnosis not present

## 2022-07-03 DIAGNOSIS — J9611 Chronic respiratory failure with hypoxia: Secondary | ICD-10-CM | POA: Diagnosis not present

## 2022-07-03 DIAGNOSIS — I2510 Atherosclerotic heart disease of native coronary artery without angina pectoris: Secondary | ICD-10-CM | POA: Diagnosis not present

## 2022-07-03 DIAGNOSIS — I5033 Acute on chronic diastolic (congestive) heart failure: Secondary | ICD-10-CM | POA: Diagnosis not present

## 2022-07-03 LAB — BASIC METABOLIC PANEL
Anion gap: 13 (ref 5–15)
BUN: 55 mg/dL — ABNORMAL HIGH (ref 8–23)
CO2: 41 mmol/L — ABNORMAL HIGH (ref 22–32)
Calcium: 9.2 mg/dL (ref 8.9–10.3)
Chloride: 84 mmol/L — ABNORMAL LOW (ref 98–111)
Creatinine, Ser: 1.7 mg/dL — ABNORMAL HIGH (ref 0.44–1.00)
GFR, Estimated: 30 mL/min — ABNORMAL LOW (ref >60)
Glucose, Bld: 114 mg/dL — ABNORMAL HIGH (ref 70–99)
Potassium: 3.7 mmol/L (ref 3.5–5.1)
Sodium: 138 mmol/L (ref 135–145)

## 2022-07-03 LAB — MAGNESIUM: Magnesium: 1.9 mg/dL (ref 1.7–2.4)

## 2022-07-03 MED ORDER — POTASSIUM CHLORIDE CRYS ER 20 MEQ PO TBCR
40.0000 meq | EXTENDED_RELEASE_TABLET | Freq: Four times a day (QID) | ORAL | Status: AC
  Administered 2022-07-03 (×2): 40 meq via ORAL
  Filled 2022-07-03 (×2): qty 2

## 2022-07-03 MED ORDER — MAGNESIUM SULFATE 2 GM/50ML IV SOLN
2.0000 g | Freq: Once | INTRAVENOUS | Status: AC
  Administered 2022-07-03: 2 g via INTRAVENOUS
  Filled 2022-07-03: qty 50

## 2022-07-03 MED ORDER — MAGNESIUM SULFATE 2 GM/50ML IV SOLN
2.0000 g | Freq: Once | INTRAVENOUS | Status: DC
  Filled 2022-07-03: qty 50

## 2022-07-03 MED ORDER — OXYCODONE HCL 5 MG PO TABS
5.0000 mg | ORAL_TABLET | Freq: Four times a day (QID) | ORAL | Status: DC | PRN
  Administered 2022-07-03 – 2022-07-06 (×3): 5 mg via ORAL
  Filled 2022-07-03 (×4): qty 1

## 2022-07-03 NOTE — Progress Notes (Addendum)
   Notified by RN that patient was in a junctional rhythm on telemetry. Reviewed telemetry and it does look like that around 14:51 patient was in sinus rhythm with rates around 60 bpm and then had a PAC followed by a junctional escape rhythm with rates in the high 30s. We got an EKG which showed sinus bradycardia, rate 38 bpm, with known RBBB. BP is stable at 138/44 and patient is asymptomatic. Magnesium and potassium OK this morning. Will stop Toprol-XL and continue to monitor on telemetry. Asked RN to monitor BP closely to ensure she does not become hypotensive. Please let us know if she becomes symptomatic with this.  Reviewed with Dr. Harl Bowie who agreed with above plan.  Darreld Mclean, PA-C 07/03/2022 3:30 PM

## 2022-07-03 NOTE — Hospital Course (Signed)
81/F with history of CAD/CABG, chronic diastolic CHF, COPD/chronic respiratory failure on 2 L home O2 persistent atrial flutter/fibrillation presented to [facility] on 1/3 with chest pain, shortness of breath, she was noted to have an elevated troponin which peaked at 361, BNP of 2056, chest x-ray with pulmonary vascular congestion, was treated with diuretics, there was a concern for NSTEMI at [facility], cardiology was consulted and she was transferred to [facility] service for further evaluation. Unfortunately she ended up staying in the ER for 4 days until she got a bed and got transferred last night. She was diuresed, had an echocardiogram which noted EF of 65% with grade 2 diastolic dysfunction, normal RV.

## 2022-07-03 NOTE — Progress Notes (Signed)
   Heart Failure Stewardship Pharmacist Progress Note   PCP: Glenda Chroman, MD PCP-Cardiologist: Rozann Lesches, MD    HPI:  82 yo F with PMH of CAD s/p CABG and AVR in 2009, CHF, CKD IV, COPD, and afib.  She presented to [facility] on 1/3 with chest pain and shortness of breath. Found to have elevated troponin and BNP. CXR with pulmonary vascular congestion. Recived IV diuretics. ECHO showed LVEF 65% with G2DD and normal RV. Transferred to [facility] on 1/7 for further evaluation. No plan for LHC with renal dysfunction.   Current HF Medications: Diuretic: furosemide 80 mg IV BID Beta Blocker: metoprolol XL 25 mg daily SGLT2i: Jardiance 10 mg daily  Prior to admission HF Medications: Diuretic: torsemide 60 mg daily + metolazone 5 mg once weekly Beta blocker: metoprolol XL 25 mg daily ACE/ARB/ARNI: losartan 12.5 mg daily SGLT2i: Jardiance 10 mg daily  Pertinent Lab Values: Serum creatinine 1.70, BUN 55, Potassium 3.7, Sodium 138, Magnesium 1.9, BNP 2302>>458.2  Vital Signs: Weight: 235 lbs  Blood pressure: 130-150/50s Heart rate: 60s  I/O: incomplete yesterday; net since arrival to [facility] -3.7L  Medication Assistance / Insurance Benefits Check: Does the patient have prescription insurance?  Yes Type of insurance plan: [employer]  Outpatient Pharmacy:  Prior to admission outpatient pharmacy: [facility] Is the patient willing to use [facility] at discharge? Yes Is the patient willing to transition their outpatient pharmacy to utilize a [facility] outpatient pharmacy?   Pending   Assessment: 1. Acute on chronic diastolic CHF (LVEF 72%). NYHA class III symptoms. - Continue furosemide 80 mg IV BID. Strict I/Os and daily weights. Keep K>4, replacing with 80 mEq total today. Keep Mag>2, replaced with 2g IV - Continue metoprolol XL 25 mg daily - Consider resuming losartan prior to discharge once creatinine stabilizes.  - Continue Jardiance 10 mg daily. eGFR 30.    Plan: 1) Medication changes recommended at this time: - Continue IV diuresis   2) Patient assistance: - None pending  3)  Education  - Patient has been educated on current HF medications and potential additions to HF medication regimen - Patient verbalizes understanding that over the next few months, these medication doses may change and more medications may be added to optimize HF regimen - Patient has been educated on basic disease state pathophysiology and goals of therapy   Kerby Nora, PharmD, BCPS Heart Failure Stewardship Pharmacist Phone [phone]

## 2022-07-03 NOTE — Progress Notes (Signed)
Rounding Note    Patient Name: [name] Date of Encounter: 07/03/2022  [facility] Cardiologist: [name], MD   Subjective   She's feeling better. She is using her incentive spirometer.   On home O2.  Urine not counted Wt close to baseline yesterday, no new wt. Renal fxn at baseline   Inpatient Medications    Scheduled Meds:  allopurinol  300 mg Oral Daily   apixaban  2.5 mg Oral BID   atorvastatin  20 mg Oral Daily   empagliflozin  10 mg Oral Daily   fenofibrate  160 mg Oral Daily   furosemide  80 mg Intravenous BID   levothyroxine  125 mcg Oral Q0600   lidocaine  2 patch Transdermal Q24H   metoprolol succinate  25 mg Oral Daily   pantoprazole  40 mg Oral Daily   polyethylene glycol  17 g Oral Daily   Continuous Infusions:  PRN Meds: acetaminophen, bisacodyl, bisacodyl, melatonin, ondansetron (ZOFRAN) IV, mouth rinse, oxyCODONE   Vital Signs    Vitals:   07/02/22 0345 07/02/22 0500 07/02/22 1941 07/03/22 0649  BP: (!) 162/53  (!) 148/56 (!) 154/53  Pulse: 63  65 65  Resp: 20  19 13   Temp: 97.6 F (36.4 C)  97.8 F (36.6 C) 97.8 F (36.6 C)  TempSrc: Oral  Oral Oral  SpO2: 94%  95% 98%  Weight:  107 kg    Height:       No intake or output data in the 24 hours ending 07/03/22 0905     07/02/2022    5:00 AM 06/30/2022   10:55 PM 04/18/2022    2:51 PM  Last 3 Weights  Weight (lbs) 235 lb 12.8 oz 250 lb 10.6 oz 240 lb 3.2 oz  Weight (kg) 106.958 kg 113.7 kg 108.954 kg      Telemetry    Sinus, PACs - Personally Reviewed  ECG    NSR, RBBB - Personally Reviewed  Physical Exam   Vitals:   07/02/22 1941 07/03/22 0649  BP: (!) 148/56 (!) 154/53  Pulse: 65 65  Resp: 19 13  Temp: 97.8 F (36.6 C) 97.8 F (36.6 C)  SpO2: 95% 98%    GEN: No acute distress.   Neck: No sig JVD Cardiac: RRR, no murmurs, rubs, or gallops.  Respiratory: nl wob, improved air movement GI: Soft, nontender, non-distended  MS: No edema; No  deformity. Neuro:  Nonfocal  Psych: Normal affect   Labs    High Sensitivity Troponin:   Recent Labs  Lab 07/01/22 0028 07/01/22 0206  TROPONINIHS 56* 54*     Chemistry Recent Labs  Lab 07/01/22 0945 07/02/22 0058 07/03/22 0022  NA 139 139 138  K 3.4* 3.7 3.7  CL 87* 86* 84*  CO2 39* 42* 41*  GLUCOSE 100* 104* 114*  BUN 61* 56* 55*  CREATININE 2.40* 1.81* 1.70*  CALCIUM 8.8* 8.8* 9.2  MG  --   --  1.9  PROT 6.1*  --   --   ALBUMIN 3.1*  --   --   AST 24  --   --   ALT 10  --   --   ALKPHOS 40  --   --   BILITOT 0.5  --   --   GFRNONAA 20* 28* 30*  ANIONGAP 13 11 13     Lipids  Recent Labs  Lab 07/02/22 0058  CHOL 164  TRIG 232*  HDL 33*  LDLCALC 85  CHOLHDL  5.0    Hematology Recent Labs  Lab 07/01/22 0945 07/02/22 0058  WBC 8.5 8.1  RBC 2.97* 2.92*  HGB 10.0* 10.1*  HCT 31.9* 30.7*  MCV 107.4* 105.1*  MCH 33.7 34.6*  MCHC 31.3 32.9  RDW 14.0 14.0  PLT 163 177   Thyroid No results for input(s): "TSH", "FREET4" in the last 168 hours.  BNP Recent Labs  Lab 07/01/22 0206  BNP 458.2*    DDimer  Recent Labs  Lab 07/01/22 0206  DDIMER 0.49     Radiology    No results found.  Cardiac Studies   TTE 11/27/2021   1. There is a dynamic LVOT gradient created by hyperdynamic LV function  and moderate symmetrical LVH. Resting peak gradient 46 mmHg. Valsalva was  not performed. . Left ventricular ejection fraction, by estimation, is 70  to 75%. The left ventricle has  hyperdynamic function. Left ventricular endocardial border not optimally  defined to evaluate regional wall motion. There is moderate left  ventricular hypertrophy. Left ventricular diastolic parameters are  indeterminate. Elevated left atrial pressure.   2. Right ventricular systolic function is normal. The right ventricular  size is normal. Tricuspid regurgitation signal is inadequate for assessing  PA pressure.   3. Left atrial size was moderately dilated.   4. The mitral  valve is abnormal. Mild mitral valve regurgitation. Mild  mitral stenosis.   5. 21 mm Edwards Magna pericardial valve is in the AV position with  normal function. The aortic valve has been repaired/replaced. There is  mild calcification of the aortic valve. There is mild thickening of the  aortic valve. Aortic valve regurgitation  is not visualized. No aortic stenosis is present.   6. The inferior vena cava is normal in size with greater than 50%  respiratory variability, suggesting right atrial pressure of 3 mmHg.   Patient Profile     [name] is a 82 y.o. female with a history of CAD s/p CABG x2 (LIMA to LAD and SVG to RCA) in 2009, chronic HFpEF, persistent atrial fibrillation/ flutter on Eliquis, aortic stenosis s/p bioprosthetic AVR in 2009 at time of CABG, hypertension, hyperlipidemia, CKD stage IV, and hypothyroidism following thyroidectomy in 1970, who is being seen for the evaluation of chest pain at the request of Dr. Broadus John.     Assessment & Plan    HFpEF: EF normal - hypervolemic - weight 113kg->107 kg (109 kg in August) - Her renal function has improved (3.4->2.4-> 1.8->1.7)(baseline around 1.5 to 1.9)  - on home O2 2L -continue 80 mg IV lasix BID  then transition back to torsemide 60 mg daily + metolazone 5 mg once weekly ;continue potassium supplementation with ectopy. Goal K>4, Mg>2  Strict I/O, daily weights -continue jardiance 10 mg daily - OOB to chair, continue PT with ambulation  DOE - no signs of ACS -no plans for ischemic eval  AVR Her AVR gradients are stable from June 13 mmHg to 15 mmHg. Normal DI. Normal prosthesis. Placed in 2009 and only has 21 mm valve, at risk for PPM considering her weight, BMI 44.   HTN - cont metop XL 25 mg daily -resume losartan upon DC once crt stablizes  CAD: CABG x2 with LIMA to LAD and SVG to RCA  -continue atorvastatin 20 mg daily  HLD -continue fenofibrate 160 mg daily  Thyroid dx -on synthroid 125 mcg  daily  PAF/aflutter -continue eliquis    She's getting closer to euvolemia, not quite. Will continue IV until  crt bump, her habitus makes physical exam challenging. Strongly encourage ambulation, monitoring her O2 as well.  For questions or updates, please contact [place] Please consult [email or web address] for contact info under        Signed, Janina Mayo, MD  07/03/2022, 9:05 AM

## 2022-07-03 NOTE — Progress Notes (Addendum)
Occupational Therapy Treatment Patient Details Name: [name] MRN: [id number] DOB: [birth year] Today's Date: 07/03/2022   History of present illness Pt is a 82 y.o. F who presents to [facility] on 1/3 with chest pain, SOB, and noted to have elevated troponin and BNP of 2056. Chest x-ray with pulmonary vascular congestion. Concern for NSTEMI at [facility] and pt transferred to [facility] service for further evaluation. She was diuresed, had an echocardiogram which noted EF of 65% with grade 2 diastolic dysfunction, normal RV. Significant PMH: CAD/CABG, chronic diastolic CHF, COPD/chronic respiratory failure on 2 L home O2 persistent atrial flutter/fibrillation.   OT comments  Pt progressing towards goals this session, had just finished bath with NT upon arrival, pt needing min A for UB dressing and min guard overall for transfers/mobility in hall. SpO2 90-96% on 2L O2 throughout session. Pt's friend present and supportive. Pt presenting with impairments listed below, will follow acutely. Continue to recommend HHOT at d/c.   Recommendations for follow up therapy are one component of a multi-disciplinary discharge planning process, led by the attending physician.  Recommendations may be updated based on patient status, additional functional criteria and insurance authorization.    Follow Up Recommendations  Home health OT     Assistance Recommended at Discharge Set up Supervision/Assistance  Patient can return home with the following  A little help with bathing/dressing/bathroom;Assistance with cooking/housework;Assist for transportation;Help with stairs or ramp for entrance   Equipment Recommendations  None recommended by OT (pt has all needed DME)    Recommendations for Other Services PT consult    Precautions / Restrictions Precautions Precautions: Fall;Other (comment) Precaution Comments: on 2L O2 at baseline Restrictions Weight Bearing Restrictions: No       Mobility Bed  Mobility               General bed mobility comments: OOB in chair upon arrival and departure    Transfers Overall transfer level: Needs assistance Equipment used: Rolling walker (2 wheels) Transfers: Sit to/from Stand Sit to Stand: Min guard                 Balance Overall balance assessment: Needs assistance Sitting-balance support: Feet supported Sitting balance-Leahy Scale: Good     Standing balance support: Bilateral upper extremity supported Standing balance-Leahy Scale: Poor Standing balance comment: reliant on external support                           ADL either performed or assessed with clinical judgement   ADL Overall ADL's : Needs assistance/impaired                 Upper Body Dressing : Minimal assistance;Sitting Upper Body Dressing Details (indicate cue type and reason): donning gown on backside     Toilet Transfer: Min guard;Regular Toilet;Rolling walker (2 wheels);Ambulation           Functional mobility during ADLs: Min guard;Rolling walker (2 wheels)      Extremity/Trunk Assessment Upper Extremity Assessment Upper Extremity Assessment: Generalized weakness   Lower Extremity Assessment Lower Extremity Assessment: Defer to PT evaluation        Vision   Vision Assessment?: No apparent visual deficits   Perception Perception Perception: Not tested   Praxis Praxis Praxis: Not tested    Cognition Arousal/Alertness: Awake/alert Behavior During Therapy: WFL for tasks assessed/performed Overall Cognitive Status: Within Functional Limits for tasks assessed  Exercises      Shoulder Instructions       General Comments SpO2 90-96% on 2L O2 during session    Pertinent Vitals/ Pain       Pain Assessment Pain Assessment: No/denies pain  Home Living Family/patient expects to be discharged to:: Private residence                                         Prior Functioning/Environment              Frequency  Min 2X/week        Progress Toward Goals  OT Goals(current goals can now be found in the care plan section)  Progress towards OT goals: Progressing toward goals  Acute Rehab OT Goals Patient Stated Goal: none stated OT Goal Formulation: With patient Time For Goal Achievement: 07/16/22 Potential to Achieve Goals: Good ADL Goals Pt Will Perform Lower Body Bathing: with modified independence;sit to/from stand;sitting/lateral leans;with adaptive equipment Pt Will Perform Lower Body Dressing: with modified independence;sitting/lateral leans;sit to/from stand;with adaptive equipment Pt Will Transfer to Toilet: with modified independence;ambulating;regular height toilet Pt Will Perform Tub/Shower Transfer: Tub transfer;Shower transfer;with modified independence;ambulating;shower seat  Plan Discharge plan remains appropriate;Frequency remains appropriate    Co-evaluation                 AM-PAC OT "6 Clicks" Daily Activity     Outcome Measure   Help from another person eating meals?: None Help from another person taking care of personal grooming?: A Little Help from another person toileting, which includes using toliet, bedpan, or urinal?: A Little Help from another person bathing (including washing, rinsing, drying)?: A Lot Help from another person to put on and taking off regular upper body clothing?: A Little Help from another person to put on and taking off regular lower body clothing?: A Lot 6 Click Score: 17    End of Session Equipment Utilized During Treatment: Gait belt;Rolling walker (2 wheels);Oxygen  OT Visit Diagnosis: Other abnormalities of gait and mobility (R26.89);Unsteadiness on feet (R26.81);Muscle weakness (generalized) (M62.81);History of falling (Z91.81)   Activity Tolerance Patient tolerated treatment well   Patient Left with call bell/phone within reach;in chair;with chair  alarm set;with family/visitor present   Nurse Communication Mobility status        Time: 1420-1440 OT Time Calculation (min): 20 min  Charges: OT General Charges $OT Visit: 1 Visit OT Treatments $Therapeutic Activity: 8-22 mins  Renaye Rakers, OTD, OTR/L SecureChat Preferred Acute Rehab [phone] [place] 07/03/2022, 3:18 PM

## 2022-07-03 NOTE — Progress Notes (Signed)
PT Cancellation Note  Patient Details Name: [name] MRN: [id number] DOB: 12-18-1940   Cancelled Treatment:    Reason Eval/Treat Not Completed: Other (comment).  Pt was attempted x 3 with conflicts of treatment with staff and OT, then finally was too fatigued after walking on the hall.  Follow up tomorrow as time and pt allow.   Ramond Dial 07/03/2022, 3:18 PM  Mee Hives, PT PhD Acute Rehab Dept. Number: [place]

## 2022-07-03 NOTE — Progress Notes (Signed)
  Progress Note   Patient: [name] EYC:[id number] DOB: April 13, 1941 DOA: 06/30/2022     3 DOS: the patient was seen and examined on 07/03/2022   Brief hospital course: 81/F with history of CAD/CABG, chronic diastolic CHF, COPD/chronic respiratory failure on 2 L home O2 persistent atrial flutter/fibrillation presented to [facility] on 1/3 with chest pain, shortness of breath, she was noted to have an elevated troponin which peaked at 361, BNP of 2056, chest x-ray with pulmonary vascular congestion, was treated with diuretics, there was a concern for NSTEMI at [facility], cardiology was consulted and she was transferred to [facility] service for further evaluation. Unfortunately she ended up staying in the ER for 4 days until she got a bed and got transferred last night. She was diuresed, had an echocardiogram which noted EF of 65% with grade 2 diastolic dysfunction, normal RV.   Assessment and Plan: Acute on chronic diastolic CHF -Echo 1/5 with EF of 14%, grade 2 diastolic dysfunction, normal RV -Diuresed at outside hospital prior to transfer, volume status improving, continue IV Lasix 1 more day per Cardiology, continue Jardiance -Increase activity, PT eval   Chest pain, elevated troponins CAD/CABG -Transferred for evaluation of this, suspect this is likely demand ischemia and not ACS -Appreciate cards input -Continue  statin, on Eliquis which is continued -Was on metoprolol, however, noted to be bradycardic to the 30's this afternoon, thus metoprolol stopped   Chronic respiratory failure?  COPD -On 2 L home O2 at baseline   Persistent atrial flutter/fibrillation -Continue Eliquis -metoprolol held secondary to sinus brady   AKI on CKD 3b -Creatinine on admission was 3.4, this is improving, down to 1.7 today, likely cardiorenal syndrome -Baseline creatinine around 1.6   Obesity      Subjective: Without complaints this AM  Physical Exam: Vitals:   07/02/22 1941 07/03/22 0649  07/03/22 1009 07/03/22 1500  BP: (!) 148/56 (!) 154/53 (!) 136/55 (!) 138/44  Pulse: 65 65 68 (!) 38  Resp: 19 13    Temp: 97.8 F (36.6 C) 97.8 F (36.6 C) 98.1 F (36.7 C)   TempSrc: Oral Oral Oral   SpO2: 95% 98% 96%   Weight:      Height:       General exam: Awake, laying in bed, in nad Respiratory system: Normal respiratory effort, no wheezing Cardiovascular system: regular rate, s1, s2 Gastrointestinal system: Soft, nondistended, positive BS Central nervous system: CN2-12 grossly intact, strength intact Extremities: Perfused, no clubbing Skin: Normal skin turgor, no notable skin lesions seen Psychiatry: Mood normal // no visual hallucinations   Data Reviewed:  Labs reviewed: Na 138, K 3.7, Cr 1.7  Family Communication: Pt in room family not at bedside  Disposition: Status is: Inpatient Remains inpatient appropriate because: Severity of illness  Planned Discharge Destination: Home    Author: Marylu Lund, MD 07/03/2022 4:01 PM  For on call review [email or web address].

## 2022-07-04 DIAGNOSIS — R079 Chest pain, unspecified: Secondary | ICD-10-CM | POA: Diagnosis not present

## 2022-07-04 DIAGNOSIS — I5030 Unspecified diastolic (congestive) heart failure: Secondary | ICD-10-CM | POA: Diagnosis not present

## 2022-07-04 DIAGNOSIS — I10 Essential (primary) hypertension: Secondary | ICD-10-CM | POA: Diagnosis not present

## 2022-07-04 LAB — COMPREHENSIVE METABOLIC PANEL
ALT: 9 U/L (ref 0–44)
AST: 37 U/L (ref 15–41)
Albumin: 3.4 g/dL — ABNORMAL LOW (ref 3.5–5.0)
Alkaline Phosphatase: 38 U/L (ref 38–126)
Anion gap: 14 (ref 5–15)
BUN: 52 mg/dL — ABNORMAL HIGH (ref 8–23)
CO2: 38 mmol/L — ABNORMAL HIGH (ref 22–32)
Calcium: 9.6 mg/dL (ref 8.9–10.3)
Chloride: 86 mmol/L — ABNORMAL LOW (ref 98–111)
Creatinine, Ser: 2.02 mg/dL — ABNORMAL HIGH (ref 0.44–1.00)
GFR, Estimated: 24 mL/min — ABNORMAL LOW (ref >60)
Glucose, Bld: 101 mg/dL — ABNORMAL HIGH (ref 70–99)
Potassium: 5.2 mmol/L — ABNORMAL HIGH (ref 3.5–5.1)
Sodium: 138 mmol/L (ref 135–145)
Total Bilirubin: 0.9 mg/dL (ref 0.3–1.2)
Total Protein: 6.8 g/dL (ref 6.5–8.1)

## 2022-07-04 LAB — CBC
HCT: 35.3 % — ABNORMAL LOW (ref 36.0–46.0)
Hemoglobin: 11.2 g/dL — ABNORMAL LOW (ref 12.0–15.0)
MCH: 33.6 pg (ref 26.0–34.0)
MCHC: 31.7 g/dL (ref 30.0–36.0)
MCV: 106 fL — ABNORMAL HIGH (ref 80.0–100.0)
Platelets: 240 10*3/uL (ref 150–400)
RBC: 3.33 MIL/uL — ABNORMAL LOW (ref 3.87–5.11)
RDW: 14 % (ref 11.5–15.5)
WBC: 10.9 10*3/uL — ABNORMAL HIGH (ref 4.0–10.5)
nRBC: 0 % (ref 0.0–0.2)

## 2022-07-04 LAB — BASIC METABOLIC PANEL
Anion gap: 13 (ref 5–15)
BUN: 53 mg/dL — ABNORMAL HIGH (ref 8–23)
CO2: 36 mmol/L — ABNORMAL HIGH (ref 22–32)
Calcium: 9.8 mg/dL (ref 8.9–10.3)
Chloride: 85 mmol/L — ABNORMAL LOW (ref 98–111)
Creatinine, Ser: 2.04 mg/dL — ABNORMAL HIGH (ref 0.44–1.00)
GFR, Estimated: 24 mL/min — ABNORMAL LOW (ref >60)
Glucose, Bld: 120 mg/dL — ABNORMAL HIGH (ref 70–99)
Potassium: 3.9 mmol/L (ref 3.5–5.1)
Sodium: 134 mmol/L — ABNORMAL LOW (ref 135–145)

## 2022-07-04 LAB — MAGNESIUM: Magnesium: 2.5 mg/dL — ABNORMAL HIGH (ref 1.7–2.4)

## 2022-07-04 MED ORDER — CALCIUM GLUCONATE-NACL 2-0.675 GM/100ML-% IV SOLN
2.0000 g | Freq: Once | INTRAVENOUS | Status: AC
  Administered 2022-07-04: 2000 mg via INTRAVENOUS
  Filled 2022-07-04: qty 100

## 2022-07-04 MED ORDER — SODIUM ZIRCONIUM CYCLOSILICATE 5 G PO PACK
5.0000 g | PACK | Freq: Once | ORAL | Status: AC
  Administered 2022-07-04: 5 g via ORAL
  Filled 2022-07-04: qty 1

## 2022-07-04 NOTE — Progress Notes (Signed)
   Heart Failure Stewardship Pharmacist Progress Note   PCP: Glenda Chroman, MD PCP-Cardiologist: Rozann Lesches, MD    HPI:  82 yo F with PMH of CAD s/p CABG and AVR in 2009, CHF, CKD IV, COPD, and afib.  She presented to [facility] on 1/3 with chest pain and shortness of breath. Found to have elevated troponin and BNP. CXR with pulmonary vascular congestion. Recived IV diuretics. ECHO showed LVEF 65% with G2DD and normal RV. Transferred to [facility] on 1/7 for further evaluation. No plan for LHC with renal dysfunction.   Current HF Medications: SGLT2i: Jardiance 10 mg daily  Prior to admission HF Medications: Diuretic: torsemide 60 mg daily + metolazone 5 mg once weekly Beta blocker: metoprolol XL 25 mg daily ACE/ARB/ARNI: losartan 12.5 mg daily SGLT2i: Jardiance 10 mg daily  Pertinent Lab Values: Serum creatinine 2.02, BUN 52, Potassium 5.2 (hemolysis), Sodium 138, Magnesium 1.9, BNP 2302>>458.2  Vital Signs: Weight: 233 lbs  Blood pressure: 130-150/40s Heart rate: 30-40s  I/O: incomplete yesterday; net since arrival to [facility] -3.7L  Medication Assistance / Insurance Benefits Check: Does the patient have prescription insurance?  Yes Type of insurance plan: [place] [employer]  Outpatient Pharmacy:  Prior to admission outpatient pharmacy: [facility] Is the patient willing to use [facility] at discharge? Yes Is the patient willing to transition their outpatient pharmacy to utilize a [facility] outpatient pharmacy?   Pending   Assessment: 1. Acute on chronic diastolic CHF (LVEF 15%). NYHA class III symptoms. - Off IV lasix. AKI. Strict I/Os and daily weights. Keep K>4, lab was hemolyzed today. Keep Mag>2, replacing with 2g IV x1 - Stopped metoprolol XL with bradycardia - Consider resuming losartan prior to discharge once creatinine stabilizes.  - Continue Jardiance 10 mg daily. eGFR 20-30.   Plan: 1) Medication changes recommended at this time: - Agree with  changes  2) Patient assistance: - None pending  3)  Education  - Patient has been educated on current HF medications and potential additions to HF medication regimen - Patient verbalizes understanding that over the next few months, these medication doses may change and more medications may be added to optimize HF regimen - Patient has been educated on basic disease state pathophysiology and goals of therapy   Kerby Nora, PharmD, BCPS Heart Failure Stewardship Pharmacist Phone [phone]

## 2022-07-04 NOTE — Progress Notes (Addendum)
Physical Therapy Treatment Patient Details Name: [name] MRN: [id number] DOB: 1940-06-29 Today's Date: 07/04/2022   History of Present Illness Pt is a 82 y.o. F who presents to [facility] on 1/3 with chest pain, SOB, and noted to have elevated troponin and BNP of 2056. Chest x-ray with pulmonary vascular congestion. Concern for NSTEMI at [facility] and pt transferred to [facility] service for further evaluation. She was diuresed, had an echocardiogram which noted EF of 65% with grade 2 diastolic dysfunction, normal RV. Significant PMH: CAD/CABG, chronic diastolic CHF, COPD/chronic respiratory failure on 2 L home O2 persistent atrial flutter/fibrillation.    PT Comments    Pt was seen for careful movement with slow paced gait and close watch of HR which were in high 50's with gait, 59 at end of walk.  Has been lower at times, on 2L O2 and sats were high 90's.  Pt is demonstrating good tolerance with careful progression, and requires supervised walk on the hall given her cardio function.  Pt is not feeling any symptoms with mobility today.  Follow acutely with nursing permission to move, and will progress as medically appropriate.   Recommendations for follow up therapy are one component of a multi-disciplinary discharge planning process, led by the attending physician.  Recommendations may be updated based on patient status, additional functional criteria and insurance authorization.  Follow Up Recommendations  Home health PT     Assistance Recommended at Discharge PRN  Patient can return home with the following A little help with bathing/dressing/bathroom;Assistance with cooking/housework;Assist for transportation;Help with stairs or ramp for entrance   Equipment Recommendations  None recommended by PT    Recommendations for Other Services       Precautions / Restrictions Precautions Precautions: Fall;Other (comment) Precaution Comments: on 2L O2 at baseline Restrictions Weight  Bearing Restrictions: No     Mobility  Bed Mobility               General bed mobility comments: up in chair when PT arrived    Transfers Overall transfer level: Needs assistance Equipment used: Rolling walker (2 wheels) Transfers: Sit to/from Stand Sit to Stand: Supervision                Ambulation/Gait Ambulation/Gait assistance: Min guard Gait Distance (Feet): 120 Feet Assistive device: Rolling walker (2 wheels) Gait Pattern/deviations: Step-through pattern, Decreased stride length Gait velocity: decreased Gait velocity interpretation: <1.31 ft/sec, indicative of household ambulator Pre-gait activities: standing balance ck General Gait Details: min guard for balance control but pt is reluctant for PT to have any contact, feels she can do it herself   Chief Strategy Officer    Modified Rankin (Stroke Patients Only)       Balance Overall balance assessment: Needs assistance Sitting-balance support: Feet supported Sitting balance-Leahy Scale: Good     Standing balance support: Bilateral upper extremity supported Standing balance-Leahy Scale: Fair Standing balance comment: less than fair dynamically                            Cognition Arousal/Alertness: Awake/alert Behavior During Therapy: WFL for tasks assessed/performed Overall Cognitive Status: Within Functional Limits for tasks assessed  Exercises      General Comments General comments (skin integrity, edema, etc.): pt is having  a moment of feeling very confident, note her O2 sats are controlled with 2L O2 at 97% and HR is low initially but up to 59 with gait.      Pertinent Vitals/Pain Pain Assessment Pain Assessment: No/denies pain    Home Living                          Prior Function            PT Goals (current goals can now be found in the care plan section) Acute Rehab PT  Goals Patient Stated Goal: back to baseline Progress towards PT goals: Progressing toward goals    Frequency    Min 3X/week      PT Plan Current plan remains appropriate    Co-evaluation              AM-PAC PT "6 Clicks" Mobility   Outcome Measure  Help needed turning from your back to your side while in a flat bed without using bedrails?: A Little Help needed moving from lying on your back to sitting on the side of a flat bed without using bedrails?: A Little Help needed moving to and from a bed to a chair (including a wheelchair)?: A Little Help needed standing up from a chair using your arms (e.g., wheelchair or bedside chair)?: A Little Help needed to walk in hospital room?: A Little Help needed climbing 3-5 steps with a railing? : A Lot 6 Click Score: 17    End of Session Equipment Utilized During Treatment: Gait belt;Oxygen Activity Tolerance: Patient tolerated treatment well Patient left: in chair;with call bell/phone within reach;with chair alarm set;with family/visitor present Nurse Communication: Mobility status PT Visit Diagnosis: Unsteadiness on feet (R26.81);Muscle weakness (generalized) (M62.81)     Time: 5277-8242 PT Time Calculation (min) (ACUTE ONLY): 25 min  Charges:  $Gait Training: 8-22 mins $Therapeutic Activity: 8-22 mins     Ramond Dial 07/04/2022, 4:11 PM  Mee Hives, PT PhD Acute Rehab Dept. Number: [place] and [place]

## 2022-07-04 NOTE — Progress Notes (Addendum)
Rounding Note    Patient Name: [name] Date of Encounter: 07/04/2022  [place] Cardiologist: April Lesches, MD   Subjective   Did not sleep well noted alerted about low HR. She denies LH. She is asymptomatic. She has junctional rhythm. Overnight in the 30s, was up then down to 40s.   On home O2.  Urine not counted Wt close to baseline Crt bumped   Inpatient Medications    Scheduled Meds:  allopurinol  300 mg Oral Daily   apixaban  2.5 mg Oral BID   atorvastatin  20 mg Oral Daily   empagliflozin  10 mg Oral Daily   fenofibrate  160 mg Oral Daily   levothyroxine  125 mcg Oral Q0600   lidocaine  2 patch Transdermal Q24H   pantoprazole  40 mg Oral Daily   polyethylene glycol  17 g Oral Daily   Continuous Infusions:  magnesium sulfate bolus IVPB     PRN Meds: acetaminophen, bisacodyl, bisacodyl, melatonin, ondansetron (ZOFRAN) IV, mouth rinse, oxyCODONE   Vital Signs    Vitals:   07/04/22 0028 07/04/22 0346 07/04/22 0500 07/04/22 0828  BP:  (!) 128/41  (!) 152/46  Pulse:  (!) 35  (!) 46  Resp:  16  15  Temp:  98.2 F (36.8 C)  98.3 F (36.8 C)  TempSrc:  Oral  Oral  SpO2:  98%  96%  Weight: 105.9 kg  105.9 kg   Height:        Intake/Output Summary (Last 24 hours) at 07/04/2022 0958 Last data filed at 07/04/2022 0900 Gross per 24 hour  Intake 240 ml  Output --  Net 240 ml       07/04/2022    5:00 AM 07/04/2022   12:28 AM 07/02/2022    5:00 AM  Last 3 Weights  Weight (lbs) 233 lb 6.4 oz 233 lb 6.4 oz 235 lb 12.8 oz  Weight (kg) 105.87 kg 105.87 kg 106.958 kg      Telemetry    Sinus, PACs - Personally Reviewed  ECG    NSR, RBBB - Personally Reviewed  Physical Exam   Vitals:   07/04/22 0346 07/04/22 0828  BP: (!) 128/41 (!) 152/46  Pulse: (!) 35 (!) 46  Resp: 16 15  Temp: 98.2 F (36.8 C) 98.3 F (36.8 C)  SpO2: 98% 96%    GEN: No acute distress.   Neck: No sig JVD Cardiac: RRR, no murmurs, rubs, or gallops.   Respiratory: nl wob, improved air movement GI: Soft, nontender, non-distended  MS: No edema; No deformity. Neuro:  Nonfocal  Psych: Normal affect   Labs    High Sensitivity Troponin:   Recent Labs  Lab 07/01/22 0028 07/01/22 0206  TROPONINIHS 56* 54*     Chemistry Recent Labs  Lab 07/01/22 0945 07/02/22 0058 07/03/22 0022 07/04/22 0030  NA 139 139 138 138  K 3.4* 3.7 3.7 5.2*  CL 87* 86* 84* 86*  CO2 39* 42* 41* 38*  GLUCOSE 100* 104* 114* 101*  BUN 61* 56* 55* 52*  CREATININE 2.40* 1.81* 1.70* 2.02*  CALCIUM 8.8* 8.8* 9.2 9.6  MG  --   --  1.9  --   PROT 6.1*  --   --  6.8  ALBUMIN 3.1*  --   --  3.4*  AST 24  --   --  37  ALT 10  --   --  9  ALKPHOS 40  --   --  38  BILITOT 0.5  --   --  0.9  GFRNONAA 20* 28* 30* 24*  ANIONGAP 13 11 13 14     Lipids  Recent Labs  Lab 07/02/22 0058  CHOL 164  TRIG 232*  HDL 33*  LDLCALC 85  CHOLHDL 5.0    Hematology Recent Labs  Lab 07/01/22 0945 07/02/22 0058 07/04/22 0030  WBC 8.5 8.1 10.9*  RBC 2.97* 2.92* 3.33*  HGB 10.0* 10.1* 11.2*  HCT 31.9* 30.7* 35.3*  MCV 107.4* 105.1* 106.0*  MCH 33.7 34.6* 33.6  MCHC 31.3 32.9 31.7  RDW 14.0 14.0 14.0  PLT 163 177 240   Thyroid No results for input(s): "TSH", "FREET4" in the last 168 hours.  BNP Recent Labs  Lab 07/01/22 0206  BNP 458.2*    DDimer  Recent Labs  Lab 07/01/22 0206  DDIMER 0.49     Radiology    No results found.  Cardiac Studies   TTE 11/27/2021   1. There is a dynamic LVOT gradient created by hyperdynamic LV function  and moderate symmetrical LVH. Resting peak gradient 46 mmHg. Valsalva was  not performed. . Left ventricular ejection fraction, by estimation, is 70  to 75%. The left ventricle has  hyperdynamic function. Left ventricular endocardial border not optimally  defined to evaluate regional wall motion. There is moderate left  ventricular hypertrophy. Left ventricular diastolic parameters are  indeterminate. Elevated left  atrial pressure.   2. Right ventricular systolic function is normal. The right ventricular  size is normal. Tricuspid regurgitation signal is inadequate for assessing  PA pressure.   3. Left atrial size was moderately dilated.   4. The mitral valve is abnormal. Mild mitral valve regurgitation. Mild  mitral stenosis.   5. 21 mm Edwards Magna pericardial valve is in the AV position with  normal function. The aortic valve has been repaired/replaced. There is  mild calcification of the aortic valve. There is mild thickening of the  aortic valve. Aortic valve regurgitation  is not visualized. No aortic stenosis is present.   6. The inferior vena cava is normal in size with greater than 50%  respiratory variability, suggesting right atrial pressure of 3 mmHg.   Patient Profile     [name] is a 82 y.o. female with a history of CAD s/p CABG x2 (LIMA to LAD and SVG to RCA) in 2009, chronic HFpEF, persistent atrial fibrillation/ flutter on Eliquis, aortic stenosis s/p bioprosthetic AVR in 2009 at time of CABG, hypertension, hyperlipidemia, CKD stage IV, and hypothyroidism following thyroidectomy in 1970, who is being seen for the evaluation of chest pain at the request of Dr. 1971.     Assessment & Plan     Junctional rhythm 30s-40s: can be in the setting of contraction alkalosis.  Bicarb also high with obesity hypoventilation most likely. Will hold diuresis for today. She is asymptomatic and this rhythm is stable. If QRS widens, let [name] know. K>5.2. Mg 1.9. Stop potassium supplementation. Correct hyperkalemia (e.g. insulin+dextrose/albuterol). I'll give CA gluconate  HFpEF: EF normal - hypervolemic - weight 113kg->107 kg (109 kg in August) - Her renal function has improved (3.4->2.4-> 1.8->1.7->2.2)(baseline around 1.5 to 1.9)  - on home O2 2L -stopped 80 mg IV lasix BID  - will plan to transition back to torsemide 60 mg daily + metolazone 5 mg when renal fxn gets back to baseline.  ;continue potassium supplementation with ectopy.Strict I/O, daily weights -continue jardiance 10 mg daily - OOB to chair, continue PT with ambulation  DOE - no signs of ACS -no plans for ischemic eval  AVR Her AVR gradients are stable from June 13 mmHg to 15 mmHg. Normal DI. Normal prosthesis. Placed in 2009 and only has 21 mm valve, at risk for PPM considering her weight, BMI 44.   HTN - hold metop XL 25 mg daily -resume losartan upon DC once crt stablizes  CAD: CABG x2 with LIMA to LAD and SVG to RCA  -continue atorvastatin 20 mg daily  HLD -continue fenofibrate 160 mg daily  Thyroid dx -on synthroid 125 mcg daily  PAF/aflutter -continue eliquis    Diuresis holiday today. Goal to stabilize rhythm back to sinus. Strongly encourage ambulation, monitoring her O2 as well.  For questions or updates, please contact [place] Please consult [email or web address] for contact info under        Signed, Janina Mayo, MD  07/04/2022, 9:58 AM

## 2022-07-04 NOTE — Progress Notes (Signed)
  Progress Note   Patient: [name] [id number]:[id number] DOB: September 09, 1940 DOA: 06/30/2022     4 DOS: the patient was seen and examined on 07/04/2022   Brief hospital course: 81/F with history of CAD/CABG, chronic diastolic CHF, COPD/chronic respiratory failure on 2 L home O2 persistent atrial flutter/fibrillation presented to [facility] on 1/3 with chest pain, shortness of breath, she was noted to have an elevated troponin which peaked at 361, BNP of 2056, chest x-ray with pulmonary vascular congestion, was treated with diuretics, there was a concern for NSTEMI at [facility], cardiology was consulted and she was transferred to [facility] service for further evaluation. Unfortunately she ended up staying in the ER for 4 days until she got a bed and got transferred last night. She was diuresed, had an echocardiogram which noted EF of 65% with grade 2 diastolic dysfunction, normal RV.   Assessment and Plan: Acute on chronic diastolic CHF -Echo 1/5 with EF of 81%, grade 2 diastolic dysfunction, normal RV -Diuresed at outside hospital prior to transfer, volume status improved. Holding off on diuresis today, discussed with Cardiology. Continued Jardiance -Increase activity, PT eval   Chest pain, elevated troponins CAD/CABG -Transferred for evaluation of this, suspect this is likely demand ischemia and not ACS -Appreciate cards input -Continue  statin, on Eliquis which is continued -Was on metoprolol, however, noted to be bradycardic to the 30's this afternoon, thus metoprolol stopped  Bradycardia -HR into the 30's -Metoprolol stopped -discussed with Cardiology, recs to optimize lytes. K noted to be 5.2 this AM -given one dose of lokelma -Recheck lytes in afternoon   Chronic respiratory failure?  COPD -On 2 L home O2 at baseline   Persistent atrial flutter/fibrillation -Continue Eliquis -metoprolol held secondary to bradycardia   AKI on CKD 3b -Creatinine on admission was 3.4 -Baseline  creatinine around 1.6 -Cr up to 2.02 today. Lasix held   Obesity      Subjective: Complaining of R leg pain, while sitting in reclining chair  Physical Exam: Vitals:   07/04/22 0346 07/04/22 0500 07/04/22 0828 07/04/22 1135  BP: (!) 128/41  (!) 152/46 (!) 139/56  Pulse: (!) 35  (!) 46 (!) 39  Resp: 16  15 14   Temp: 98.2 F (36.8 C)  98.3 F (36.8 C) 98.3 F (36.8 C)  TempSrc: Oral  Oral Oral  SpO2: 98%  96% 95%  Weight:  105.9 kg    Height:       General exam: Conversant, in no acute distress Respiratory system: normal chest rise, clear, no audible wheezing Cardiovascular system: regular rhythm, s1-s2 Gastrointestinal system: Nondistended, nontender, pos BS Central nervous system: No seizures, no tremors Extremities: No cyanosis, no joint deformities Skin: No rashes, no pallor Psychiatry: Affect normal // no auditory hallucinations   Data Reviewed:  Labs reviewed: Na 138, K 5.2, Cr 2.02  Family Communication: Pt in room family not at bedside  Disposition: Status is: Inpatient Remains inpatient appropriate because: Severity of illness  Planned Discharge Destination: Home    Author: Marylu Lund, MD 07/04/2022 5:17 PM  For on call review [email or web address].

## 2022-07-05 ENCOUNTER — Inpatient Hospital Stay (HOSPITAL_COMMUNITY): Payer: Medicare PPO

## 2022-07-05 ENCOUNTER — Encounter (HOSPITAL_COMMUNITY): Payer: Self-pay | Admitting: Internal Medicine

## 2022-07-05 DIAGNOSIS — I428 Other cardiomyopathies: Secondary | ICD-10-CM | POA: Diagnosis not present

## 2022-07-05 DIAGNOSIS — I5030 Unspecified diastolic (congestive) heart failure: Secondary | ICD-10-CM | POA: Diagnosis not present

## 2022-07-05 DIAGNOSIS — I10 Essential (primary) hypertension: Secondary | ICD-10-CM | POA: Diagnosis not present

## 2022-07-05 DIAGNOSIS — R079 Chest pain, unspecified: Secondary | ICD-10-CM | POA: Diagnosis not present

## 2022-07-05 LAB — ECHOCARDIOGRAM LIMITED
AR max vel: 1.73 cm2
AV Area VTI: 1.96 cm2
AV Area mean vel: 1.71 cm2
AV Mean grad: 14.3 mmHg
AV Peak grad: 26.1 mmHg
Ao pk vel: 2.56 m/s
Height: 63 in
S' Lateral: 3.6 cm
Weight: 3715.2 oz

## 2022-07-05 LAB — COMPREHENSIVE METABOLIC PANEL
ALT: 8 U/L (ref 0–44)
AST: 20 U/L (ref 15–41)
Albumin: 3.1 g/dL — ABNORMAL LOW (ref 3.5–5.0)
Alkaline Phosphatase: 37 U/L — ABNORMAL LOW (ref 38–126)
Anion gap: 13 (ref 5–15)
BUN: 57 mg/dL — ABNORMAL HIGH (ref 8–23)
CO2: 37 mmol/L — ABNORMAL HIGH (ref 22–32)
Calcium: 9.5 mg/dL (ref 8.9–10.3)
Chloride: 87 mmol/L — ABNORMAL LOW (ref 98–111)
Creatinine, Ser: 2.12 mg/dL — ABNORMAL HIGH (ref 0.44–1.00)
GFR, Estimated: 23 mL/min — ABNORMAL LOW (ref >60)
Glucose, Bld: 114 mg/dL — ABNORMAL HIGH (ref 70–99)
Potassium: 4 mmol/L (ref 3.5–5.1)
Sodium: 137 mmol/L (ref 135–145)
Total Bilirubin: 0.4 mg/dL (ref 0.3–1.2)
Total Protein: 6.2 g/dL — ABNORMAL LOW (ref 6.5–8.1)

## 2022-07-05 LAB — MAGNESIUM: Magnesium: 2.5 mg/dL — ABNORMAL HIGH (ref 1.7–2.4)

## 2022-07-05 MED ORDER — PERFLUTREN LIPID MICROSPHERE
1.0000 mL | INTRAVENOUS | Status: AC | PRN
  Administered 2022-07-05: 2 mL via INTRAVENOUS

## 2022-07-05 NOTE — Progress Notes (Signed)
  Progress Note   Patient: [name] VEL:[id number] DOB: 09/04/1940 DOA: 06/30/2022     5 DOS: the patient was seen and examined on 07/05/2022   Brief hospital course: 81/F with history of CAD/CABG, chronic diastolic CHF, COPD/chronic respiratory failure on 2 L home O2 persistent atrial flutter/fibrillation presented to [facility] on 1/3 with chest pain, shortness of breath, she was noted to have an elevated troponin which peaked at 361, BNP of 2056, chest x-ray with pulmonary vascular congestion, was treated with diuretics, there was a concern for NSTEMI at [facility], cardiology was consulted and she was transferred to [facility] service for further evaluation. Unfortunately she ended up staying in the ER for 4 days until she got a bed and got transferred last night. She was diuresed, had an echocardiogram which noted EF of 65% with grade 2 diastolic dysfunction, normal RV.   Assessment and Plan: Acute on chronic diastolic CHF -Echo 1/5 with EF of 25%, grade 2 diastolic dysfunction, normal RV -Diuresed at outside hospital prior to transfer, volume status improved. Seems dehydrated this AM, holding off on diuresis. Continued Jardiance -Per Cardiology, can consider RHC next week -Increase activity, PT eval   Chest pain, elevated troponins CAD/CABG -Transferred for evaluation of this, suspect this is likely demand ischemia and not ACS -Appreciate cards input -Continue  statin, on Eliquis which is continued -Was on metoprolol, however, noted to be bradycardic to the 30's recently, thus metoprolol stopped. HR now improved  Bradycardia -HR recently into the 30's -Metoprolol now stopped -was recently mildly hyperkalemic, now resolved   Chronic respiratory failure?  COPD -On 2 L home O2 at baseline   Persistent atrial flutter/fibrillation -Continue Eliquis -metoprolol held secondary to bradycardia   AKI on CKD 3b -Creatinine on admission was 3.4 -Baseline creatinine around 1.6 -Cr up  to 2.12 today. Cardiology recs to continue to hold diuresis. Encouraged pt to self-hydrate   Obesity      Subjective: Feeling thirsty today. Reports decreased urine output. States mouth feels dry  Physical Exam: Vitals:   07/05/22 0103 07/05/22 0435 07/05/22 0600 07/05/22 1135  BP:  (!) 140/36 (!) 137/33 (!) 153/61  Pulse:  (!) 38  60  Resp:  15 16 14   Temp:  (!) 97.5 F (36.4 C) 97.8 F (36.6 C) 98.1 F (36.7 C)  TempSrc:  Oral Oral Oral  SpO2:  93% 97%   Weight: 105.3 kg     Height:       General exam: Awake, laying in bed, in nad Respiratory system: Normal respiratory effort, no wheezing Cardiovascular system: regular rate, s1, s2 Gastrointestinal system: Soft, nondistended, positive BS Central nervous system: CN2-12 grossly intact, strength intact Extremities: Perfused, no clubbing Skin: Normal skin turgor, no notable skin lesions seen Psychiatry: Mood normal // no visual hallucinations   Data Reviewed:  Labs reviewed: Na 137, K 4.0, Cr 2.12  Family Communication: Pt in room family not at bedside  Disposition: Status is: Inpatient Remains inpatient appropriate because: Severity of illness  Planned Discharge Destination: Home    Author: Marylu Lund, MD 07/05/2022 2:45 PM  For on call review [email or web address].

## 2022-07-05 NOTE — Progress Notes (Incomplete)
Rounding Note    Patient Name: [name] Date of Encounter: 07/05/2022  [facility] Cardiologist: Rozann Lesches, MD ***  Subjective   ***  Inpatient Medications    Scheduled Meds:  allopurinol  300 mg Oral Daily   apixaban  2.5 mg Oral BID   atorvastatin  20 mg Oral Daily   empagliflozin  10 mg Oral Daily   fenofibrate  160 mg Oral Daily   levothyroxine  125 mcg Oral Q0600   lidocaine  2 patch Transdermal Q24H   pantoprazole  40 mg Oral Daily   polyethylene glycol  17 g Oral Daily   Continuous Infusions:  PRN Meds: acetaminophen, bisacodyl, bisacodyl, melatonin, ondansetron (ZOFRAN) IV, mouth rinse, oxyCODONE   Vital Signs    Vitals:   07/05/22 0103 07/05/22 0435 07/05/22 0600 07/05/22 1135  BP:  (!) 140/36 (!) 137/33 (!) 153/61  Pulse:  (!) 38  60  Resp:  15 16 14   Temp:  (!) 97.5 F (36.4 C) 97.8 F (36.6 C) 98.1 F (36.7 C)  TempSrc:  Oral Oral Oral  SpO2:  93% 97%   Weight: 105.3 kg     Height:        Intake/Output Summary (Last 24 hours) at 07/05/2022 1943 Last data filed at 07/05/2022 1234 Gross per 24 hour  Intake 600 ml  Output --  Net 600 ml      07/05/2022    1:03 AM 07/04/2022    5:00 AM 07/04/2022   12:28 AM  Last 3 Weights  Weight (lbs) 232 lb 3.2 oz 233 lb 6.4 oz 233 lb 6.4 oz  Weight (kg) 105.325 kg 105.87 kg 105.87 kg      Telemetry    *** - Personally Reviewed  ECG    *** - Personally Reviewed  Physical Exam  *** GEN: No acute distress.   Neck: No JVD Cardiac: RRR, no murmurs, rubs, or gallops.  Respiratory: Clear to auscultation bilaterally. GI: Soft, nontender, non-distended  MS: No edema; No deformity. Neuro:  Nonfocal  Psych: Normal affect   Labs    High Sensitivity Troponin:   Recent Labs  Lab 07/01/22 0028 07/01/22 0206  TROPONINIHS 56* 54*     Chemistry Recent Labs  Lab 07/01/22 0945 07/02/22 0058 07/03/22 0022 07/04/22 0030 07/04/22 1742 07/05/22 0030  NA 139   < > 138 138 134*  137  K 3.4*   < > 3.7 5.2* 3.9 4.0  CL 87*   < > 84* 86* 85* 87*  CO2 39*   < > 41* 38* 36* 37*  GLUCOSE 100*   < > 114* 101* 120* 114*  BUN 61*   < > 55* 52* 53* 57*  CREATININE 2.40*   < > 1.70* 2.02* 2.04* 2.12*  CALCIUM 8.8*   < > 9.2 9.6 9.8 9.5  MG  --   --  1.9  --  2.5* 2.5*  PROT 6.1*  --   --  6.8  --  6.2*  ALBUMIN 3.1*  --   --  3.4*  --  3.1*  AST 24  --   --  37  --  20  ALT 10  --   --  9  --  8  ALKPHOS 40  --   --  38  --  37*  BILITOT 0.5  --   --  0.9  --  0.4  GFRNONAA 20*   < > 30* 24* 24* 23*  ANIONGAP 13   < >  13 14 13 13    < > = values in this interval not displayed.    Lipids  Recent Labs  Lab 07/02/22 0058  CHOL 164  TRIG 232*  HDL 33*  LDLCALC 85  CHOLHDL 5.0    Hematology Recent Labs  Lab 07/01/22 0945 07/02/22 0058 07/04/22 0030  WBC 8.5 8.1 10.9*  RBC 2.97* 2.92* 3.33*  HGB 10.0* 10.1* 11.2*  HCT 31.9* 30.7* 35.3*  MCV 107.4* 105.1* 106.0*  MCH 33.7 34.6* 33.6  MCHC 31.3 32.9 31.7  RDW 14.0 14.0 14.0  PLT 163 177 240   Thyroid No results for input(s): "TSH", "FREET4" in the last 168 hours.  BNP Recent Labs  Lab 07/01/22 0206  BNP 458.2*    DDimer  Recent Labs  Lab 07/01/22 0206  DDIMER 0.49     Radiology    ECHOCARDIOGRAM LIMITED  Result Date: 07/05/2022    ECHOCARDIOGRAM LIMITED REPORT   Patient Name:   [name] Date of Exam: 07/05/2022 Medical Rec #:  09/03/2022   Height:       63.0 in Accession #:    [id number]  Weight:       232.2 lb Date of Birth:  [birth year]  BSA:          2.060 m Patient Age:    81 years    BP:           153/61 mmHg Patient Gender: F           HR:           68 bpm. Exam Location:  Inpatient Procedure: Limited Echo, Cardiac Doppler, Color Doppler and Intracardiac            Opacification Agent Indications:    Cardiomyopathy, unspecified  History:        Patient has prior history of Echocardiogram examinations, most                 recent 11/27/2021. CAD, Prior CABG, COPD, Aortic Valve Disease,                  Arrythmias:Atrial Fibrillation, Signs/Symptoms:Chest Pain; Risk                 Factors:Dyslipidemia and Hypertension. S/P aortic valve                 replacement with 21 mm Edwards Magna pericardial valve.                 Procedure date 2009. CKD. HFpEF.  Sonographer:    2010 RDCS (AE) Referring Phys: Ross Ludwig  Sonographer Comments: Technically difficult study due to poor echo windows and patient is obese. IMPRESSIONS  1. Left ventricular ejection fraction, by estimation, is 65 to 70%. The left ventricle has normal function. The left ventricle has no regional wall motion abnormalities. There is moderate concentric left ventricular hypertrophy. Indeterminate diastolic filling due to E-A fusion.  2. Right ventricular systolic function was not well visualized. The right ventricular size is not well visualized. There is normal pulmonary artery systolic pressure. The estimated right ventricular systolic pressure is 12.1 mmHg.  3. The mitral valve is degenerative. Trivial mitral valve regurgitation. No evidence of mitral stenosis.  4. 21 mm Magna pericardial bioprosthetic aortic valve. Vmax 2.5 m/s, MG 14 mmHg, EOA 1.96 cm2, DI 0.62. Trivial regurgitation noted. The aortic valve has been repaired/replaced. Aortic valve regurgitation is trivial. Procedure Date: 12/10/2007.  5. The inferior vena cava is normal  in size with greater than 50% respiratory variability, suggesting right atrial pressure of 3 mmHg. Comparison(s): No significant change from prior study. FINDINGS  Left Ventricle: Left ventricular ejection fraction, by estimation, is 65 to 70%. The left ventricle has normal function. The left ventricle has no regional wall motion abnormalities. Definity contrast agent was given IV to delineate the left ventricular  endocardial borders. The left ventricular internal cavity size was normal in size. There is moderate concentric left ventricular hypertrophy. Abnormal (paradoxical) septal motion  consistent with post-operative status. Indeterminate diastolic filling due  to E-A fusion. Right Ventricle: The right ventricular size is not well visualized. Right vetricular wall thickness was not well visualized. Right ventricular systolic function was not well visualized. There is normal pulmonary artery systolic pressure. The tricuspid regurgitant velocity is 1.51 m/s, and with an assumed right atrial pressure of 3 mmHg, the estimated right ventricular systolic pressure is 12.1 mmHg. Pericardium: There is no evidence of pericardial effusion. Presence of epicardial fat layer. Mitral Valve: The mitral valve is degenerative in appearance. Mild to moderate mitral annular calcification. Trivial mitral valve regurgitation. No evidence of mitral valve stenosis. Tricuspid Valve: The tricuspid valve is grossly normal. Tricuspid valve regurgitation is trivial. No evidence of tricuspid stenosis. Aortic Valve: 21 mm Magna pericardial bioprosthetic aortic valve. Vmax 2.5 m/s, MG 14 mmHg, EOA 1.96 cm2, DI 0.62. Trivial regurgitation noted. The aortic valve has been repaired/replaced. Aortic valve regurgitation is trivial. Aortic valve mean gradient  measures 14.3 mmHg. Aortic valve peak gradient measures 26.1 mmHg. Aortic valve area, by VTI measures 1.96 cm. There is a 21 mm bioprosthetic valve present in the aortic position. Pulmonic Valve: The pulmonic valve was grossly normal. Pulmonic valve regurgitation is trivial. No evidence of pulmonic stenosis. Venous: The inferior vena cava is normal in size with greater than 50% respiratory variability, suggesting right atrial pressure of 3 mmHg. LEFT VENTRICLE PLAX 2D LVIDd:         5.00 cm LVIDs:         3.60 cm LV PW:         1.40 cm LV IVS:        1.50 cm LVOT diam:     2.00 cm LV SV:         100 LV SV Index:   48 LVOT Area:     3.14 cm  IVC IVC diam: 1.80 cm LEFT ATRIUM         Index LA diam:    4.70 cm 2.28 cm/m  AORTIC VALVE AV Area (Vmax):    1.73 cm AV Area (Vmean):    1.71 cm AV Area (VTI):     1.96 cm AV Vmax:           255.67 cm/s AV Vmean:          173.000 cm/s AV VTI:            0.509 m AV Peak Grad:      26.1 mmHg AV Mean Grad:      14.3 mmHg LVOT Vmax:         141.00 cm/s LVOT Vmean:        94.200 cm/s LVOT VTI:          0.317 m LVOT/AV VTI ratio: 0.62  AORTA Ao Root diam: 3.10 cm TRICUSPID VALVE TR Peak grad:   9.1 mmHg TR Vmax:        151.00 cm/s  SHUNTS Systemic VTI:  0.32 m Systemic Diam: 2.00 cm Gerri Spore  O'Neal MD Electronically signed by Eleonore Chiquito MD Signature Date/Time: 07/05/2022/2:47:15 PM    Final     Cardiac Studies   TTE 07/05/22: IMPRESSIONS     1. Left ventricular ejection fraction, by estimation, is 65 to 70%. The  left ventricle has normal function. The left ventricle has no regional  wall motion abnormalities. There is moderate concentric left ventricular  hypertrophy. Indeterminate diastolic  filling due to E-A fusion.   2. Right ventricular systolic function was not well visualized. The right  ventricular size is not well visualized. There is normal pulmonary artery  systolic pressure. The estimated right ventricular systolic pressure is  47.0 mmHg.   3. The mitral valve is degenerative. Trivial mitral valve regurgitation.  No evidence of mitral stenosis.   4. 21 mm Magna pericardial bioprosthetic aortic valve. Vmax 2.5 m/s, MG  14 mmHg, EOA 1.96 cm2, DI 0.62. Trivial regurgitation noted. The aortic  valve has been repaired/replaced. Aortic valve regurgitation is trivial.  Procedure Date: 12/10/2007.   5. The inferior vena cava is normal in size with greater than 50%  respiratory variability, suggesting right atrial pressure of 3 mmHg.   Comparison(s): No significant change from prior study.   Patient Profile     82 y.o. female  with history of CAD s/p CABG x2 (LIMA to LAD and SVG to RCA) in 2009, chronic HFpEF, persistent atrial fibrillation/ flutter on Eliquis, aortic stenosis s/p bioprosthetic AVR in 2009 at time of  CABG, hypertension, hyperlipidemia, CKD stage IV who presented with acute on chronic HFpEF exacerbation for which Cardiology was consulted.    Assessment & Plan    #Acute on Chronic HFpEF Exacerbation: Patient presented with wrosening SOB found to have BNP in 2000s with pulmonary edema on CXR. TTE with LVEF 65-70%, moderate LVH, trivial MR, mean aortic valve graduebt 16mmHg, EOA 2cm2, DI 0.62. Diuresis currently being held due to rising Cr.   #Junctional Rhythm: Asymptomatic. Not on nodal agents. Currently monitoring  #Severe AS s/p AVR: Well functioning prosthesis on TTE. Mean gradient 21mmHg. EOA 2cm2, DI 0.62.  #CAD s/p CABG: History of CABG in 2009. Trop here mildly elevated and flat. No anginal symptoms. Will continue with medical management.  #Paroxysmal Afib: Maintaining NSR. -Off metop due to junctional rhythm -Continue apixaban 2.5mg  BID  #AKI on CKD: Baseline 1.5-1.9. Currently ***  #HTN:  #HLD: -Continue liptior 20mg  daily   {Are we signing off today?:[id number]}  For questions or updates, please contact [place] Please consult [email or web address] for contact info under        Signed, Freada Bergeron, MD  07/05/2022, 7:43 PM

## 2022-07-05 NOTE — Progress Notes (Signed)
   Heart Failure Stewardship Pharmacist Progress Note   PCP: Glenda Chroman, MD PCP-Cardiologist: Rozann Lesches, MD    HPI:  82 yo F with PMH of CAD s/p CABG and AVR in 2009, CHF, CKD IV, COPD, and afib.  She presented to [facility] on 1/3 with chest pain and shortness of breath. Found to have elevated troponin and BNP. CXR with pulmonary vascular congestion. Recived IV diuretics. ECHO showed LVEF 65% with G2DD and normal RV. Transferred to [facility] on 1/7 for further evaluation. No plan for LHC with renal dysfunction.   Current HF Medications: SGLT2i: Jardiance 10 mg daily  Prior to admission HF Medications: Diuretic: torsemide 60 mg daily + metolazone 5 mg once weekly Beta blocker: metoprolol XL 25 mg daily ACE/ARB/ARNI: losartan 12.5 mg daily SGLT2i: Jardiance 10 mg daily  Pertinent Lab Values: Serum creatinine 2.12, BUN 57, Potassium 4.0, Sodium 137, Magnesium 2.5, BNP 2302>>458.2  Vital Signs: Weight: 232 lbs  Blood pressure: 130-150/40s Heart rate: 60s  I/O: incomplete  Medication Assistance / Insurance Benefits Check: Does the patient have prescription insurance?  Yes Type of insurance plan: [employer]  Outpatient Pharmacy:  Prior to admission outpatient pharmacy: [facility] Is the patient willing to use [place] at discharge? Yes Is the patient willing to transition their outpatient pharmacy to utilize a [facility] outpatient pharmacy?   Pending   Assessment: 1. Acute on chronic diastolic CHF (LVEF 06%). NYHA class III symptoms. - Off IV lasix. AKI. Strict I/Os and daily weights. Keep K>4 and Mag>2. - Stopped metoprolol XL with bradycardia, rhythm improved today - Consider resuming losartan prior to discharge once creatinine stabilizes.  - Continue Jardiance 10 mg daily. eGFR 20-30.   Plan: 1) Medication changes recommended at this time: - Agree with changes  2) Patient assistance: - None pending  3)  Education  - Patient has been  educated on current HF medications and potential additions to HF medication regimen - Patient verbalizes understanding that over the next few months, these medication doses may change and more medications may be added to optimize HF regimen - Patient has been educated on basic disease state pathophysiology and goals of therapy   Kerby Nora, PharmD, BCPS Heart Failure Stewardship Pharmacist Phone [phone]

## 2022-07-05 NOTE — Progress Notes (Signed)
Rounding Note    Patient Name: [name] Date of Encounter: 07/05/2022  [place] Cardiologist: Rozann Lesches, MD   Subjective   1/11 Did not sleep well noted alerted about low HR. She denies LH. She is asymptomatic. She has junctional rhythm. Overnight in the 30s, was up then down to 40s.    1/12 Rhythm most stable today. NSR PACs, PVCs, Junctional overnight. Had a diuresis holiday yesterday. Crt stable, mildly increased. She clinically feels better. She was walking the halls and did not feel overly short of breath  On home O2.   Weight 233 (around baseline); 107kg bsl  Net negative 3.5 L total  1 year ago 1.6. 2.4->1.8->1.7->2.02->2.04->2.12   Inpatient Medications    Scheduled Meds:  allopurinol  300 mg Oral Daily   apixaban  2.5 mg Oral BID   atorvastatin  20 mg Oral Daily   empagliflozin  10 mg Oral Daily   fenofibrate  160 mg Oral Daily   levothyroxine  125 mcg Oral Q0600   lidocaine  2 patch Transdermal Q24H   pantoprazole  40 mg Oral Daily   polyethylene glycol  17 g Oral Daily   Continuous Infusions:  magnesium sulfate bolus IVPB     PRN Meds: acetaminophen, bisacodyl, bisacodyl, melatonin, ondansetron (ZOFRAN) IV, mouth rinse, oxyCODONE   Vital Signs    Vitals:   07/04/22 1940 07/05/22 0103 07/05/22 0435 07/05/22 0600  BP: (!) 150/42  (!) 140/36 (!) 137/33  Pulse: (!) 40  (!) 38   Resp: 20  15 16   Temp: 98 F (36.7 C)  (!) 97.5 F (36.4 C) 97.8 F (36.6 C)  TempSrc: Oral  Oral Oral  SpO2: 96%  93% 97%  Weight:  105.3 kg    Height:        Intake/Output Summary (Last 24 hours) at 07/05/2022 0821 Last data filed at 07/04/2022 0900 Gross per 24 hour  Intake 240 ml  Output --  Net 240 ml       07/05/2022    1:03 AM 07/04/2022    5:00 AM 07/04/2022   12:28 AM  Last 3 Weights  Weight (lbs) 232 lb 3.2 oz 233 lb 6.4 oz 233 lb 6.4 oz  Weight (kg) 105.325 kg 105.87 kg 105.87 kg      Telemetry    Sinus, PACs, PVC.  Junctional - Personally Reviewed  ECG    NSR, RBBB - Personally Reviewed  Physical Exam   Vitals:   07/05/22 0435 07/05/22 0600  BP: (!) 140/36 (!) 137/33  Pulse: (!) 38   Resp: 15 16  Temp: (!) 97.5 F (36.4 C) 97.8 F (36.6 C)  SpO2: 93% 97%    GEN: No acute distress.  Morbidly obese Neck: +JVD Cardiac: RRR, no murmurs, rubs, or gallops.  Respiratory: nl wob, improved air movement GI: Soft, nontender, non-distended  MS: No edema; No deformity. Neuro:  Nonfocal  Psych: Normal affect   Labs    High Sensitivity Troponin:   Recent Labs  Lab 07/01/22 0028 07/01/22 0206  TROPONINIHS 56* 54*     Chemistry Recent Labs  Lab 07/01/22 0945 07/02/22 0058 07/03/22 0022 07/04/22 0030 07/04/22 1742 07/05/22 0030  NA 139   < > 138 138 134* 137  K 3.4*   < > 3.7 5.2* 3.9 4.0  CL 87*   < > 84* 86* 85* 87*  CO2 39*   < > 41* 38* 36* 37*  GLUCOSE 100*   < > 114*  101* 120* 114*  BUN 61*   < > 55* 52* 53* 57*  CREATININE 2.40*   < > 1.70* 2.02* 2.04* 2.12*  CALCIUM 8.8*   < > 9.2 9.6 9.8 9.5  MG  --   --  1.9  --  2.5* 2.5*  PROT 6.1*  --   --  6.8  --  6.2*  ALBUMIN 3.1*  --   --  3.4*  --  3.1*  AST 24  --   --  37  --  20  ALT 10  --   --  9  --  8  ALKPHOS 40  --   --  38  --  37*  BILITOT 0.5  --   --  0.9  --  0.4  GFRNONAA 20*   < > 30* 24* 24* 23*  ANIONGAP 13   < > 13 14 13 13    < > = values in this interval not displayed.    Lipids  Recent Labs  Lab 07/02/22 0058  CHOL 164  TRIG 232*  HDL 33*  LDLCALC 85  CHOLHDL 5.0    Hematology Recent Labs  Lab 07/01/22 0945 07/02/22 0058 07/04/22 0030  WBC 8.5 8.1 10.9*  RBC 2.97* 2.92* 3.33*  HGB 10.0* 10.1* 11.2*  HCT 31.9* 30.7* 35.3*  MCV 107.4* 105.1* 106.0*  MCH 33.7 34.6* 33.6  MCHC 31.3 32.9 31.7  RDW 14.0 14.0 14.0  PLT 163 177 240   Thyroid No results for input(s): "TSH", "FREET4" in the last 168 hours.  BNP Recent Labs  Lab 07/01/22 0206  BNP 458.2*    DDimer  Recent Labs  Lab  07/01/22 0206  DDIMER 0.49     Radiology    No results found.  Cardiac Studies   TTE 11/27/2021   1. There is a dynamic LVOT gradient created by hyperdynamic LV function  and moderate symmetrical LVH. Resting peak gradient 46 mmHg. Valsalva was  not performed. . Left ventricular ejection fraction, by estimation, is 70  to 75%. The left ventricle has  hyperdynamic function. Left ventricular endocardial border not optimally  defined to evaluate regional wall motion. There is moderate left  ventricular hypertrophy. Left ventricular diastolic parameters are  indeterminate. Elevated left atrial pressure.   2. Right ventricular systolic function is normal. The right ventricular  size is normal. Tricuspid regurgitation signal is inadequate for assessing  PA pressure.   3. Left atrial size was moderately dilated.   4. The mitral valve is abnormal. Mild mitral valve regurgitation. Mild  mitral stenosis.   5. 21 mm Edwards Magna pericardial valve is in the AV position with  normal function. The aortic valve has been repaired/replaced. There is  mild calcification of the aortic valve. There is mild thickening of the  aortic valve. Aortic valve regurgitation  is not visualized. No aortic stenosis is present.   6. The inferior vena cava is normal in size with greater than 50%  respiratory variability, suggesting right atrial pressure of 3 mmHg.   Patient Profile     [name] is a 82 y.o. female with a history of CAD s/p CABG x2 (LIMA to LAD and SVG to RCA) in 2009, chronic HFpEF, persistent atrial fibrillation/ flutter on Eliquis, aortic stenosis s/p bioprosthetic AVR in 2009 at time of CABG, hypertension, hyperlipidemia, CKD stage IV, and hypothyroidism following thyroidectomy in 1970, who is being seen for the evaluation of chest pain at the request of Dr. 1971.  Assessment & Plan     Junctional rhythm 30s-40s: can be in the setting of contraction alkalosis.  Bicarb also high  with obesity hypoventilation most likely. Will hold diuresis for today. She is asymptomatic and this rhythm is stable. If QRS widens, let us know. K>5.2. Mg 1.9. Stop potassium supplementation. S/p hyperkalemia correction. I'll gave CA gluconate 1/11. Rhythm more stable today  HFpEF: EF normal - hypervolemic - weight 113kg->107 kg (109 kg in August) - Her renal function has improved (3.4->2.4-> 1.8->1.7->2.02->2.04->2.12)(baseline around 1.5 to 1.9)  - on home O2 2L -stopped 80 mg IV lasix BID ; crt up still, K has normalized. Would give another holiday, if crt still high over the weekend consider nephrology consult for urine studies, can consider RHC next week as well. Clinically feels better - can plan to transition back to torsemide 60 mg daily + metolazone 5 mg when renal fxn gets back to baseline. Strict I/O, daily weights -continue jardiance 10 mg daily - OOB to chair, continue PT with ambulation  DOE -no signs of ACS -no plans for ischemic eval  AVR Her AVR gradients are stable from June 13 mmHg to 15 mmHg. Normal DI. Normal prosthesis. Placed in 2009 and only has 21 mm valve, at risk for PPM considering her weight, BMI 44.   HTN - hold metop XL 25 mg daily -resume losartan upon DC once crt stablizes  CAD: CABG x2 with LIMA to LAD and SVG to RCA  -continue atorvastatin 20 mg daily  HLD -continue fenofibrate 160 mg daily  Thyroid dx -on synthroid 125 mcg daily  PAF/aflutter -continue eliquis    Diuresis holiday again today. Goal for crt to trend down prior to dispo. Strongly encourage ambulation, monitoring her O2 as well.  For questions or updates, please contact [place] Please consult [email or web address] for contact info under        Signed, Janina Mayo, MD  07/05/2022, 8:21 AM

## 2022-07-05 NOTE — Progress Notes (Signed)
  Echocardiogram 2D Echocardiogram has been performed.  [name] 07/05/2022, 1:56 PM

## 2022-07-05 NOTE — Care Management Important Message (Signed)
Important Message  Patient Details  Name: [name] MRN: [id number] Date of Birth: 03/03/1941   Medicare Important Message Given:  Yes     Shelda Altes 07/05/2022, 8:32 AM

## 2022-07-05 NOTE — Progress Notes (Signed)
   07/05/22 0435  Assess: MEWS Score  Temp (!) 97.5 F (36.4 C)  BP (!) 140/36  MAP (mmHg) 65  Pulse Rate (!) 38  ECG Heart Rate (!) 39  Resp 15  SpO2 93 %  O2 Device Nasal Cannula  O2 Flow Rate (L/min) 2 L/min  Assess: MEWS Score  MEWS Temp 0  MEWS Systolic 0  MEWS Pulse 2  MEWS RR 0  MEWS LOC 0  MEWS Score 2  MEWS Score Color Yellow  Assess: if the MEWS score is Yellow or Red  Were vital signs taken at a resting state? Yes  Focused Assessment No change from prior assessment  Does the patient meet 2 or more of the SIRS criteria? No  Does the patient have a confirmed or suspected source of infection? No  Provider and Rapid Response Notified? No  MEWS guidelines implemented *See Row Information* Yes  Treat  Pain Scale 0-10  Pain Score 0  Take Vital Signs  Increase Vital Sign Frequency  Yellow: Q 2hr X 2 then Q 4hr X 2, if remains yellow, continue Q 4hrs  Escalate  MEWS: Escalate Yellow: discuss with charge nurse/RN and consider discussing with provider and RRT  Notify: Charge Nurse/RN  Name of Charge Nurse/RN Notified Santiago Glad, RN  Date Charge Nurse/RN Notified 07/05/22  Time Charge Nurse/RN Notified 0550  Document  Patient Outcome Stabilized after interventions  Progress note created (see row info) Yes  Assess: SIRS CRITERIA  SIRS Temperature  0  SIRS Pulse 0  SIRS Respirations  0  SIRS WBC 0  SIRS Score Sum  0

## 2022-07-05 NOTE — Progress Notes (Signed)
   07/05/22 1600  Mobility  Activity Ambulated with assistance to bathroom  Level of Assistance Standby assist, set-up cues, supervision of patient - no hands on  Assistive Device Front wheel walker  Distance Ambulated (ft) 20 ft  Activity Response Tolerated well  Mobility Referral Yes  $Mobility charge 1 Mobility   Mobility Specialist Progress Note  Pt was in chair requesting to use BR. Assisted w/ pericare. Returned to chair w/ all needs met and call bell in reach   [place] Specialist  Please contact via Solicitor or Rehab office at [phone]

## 2022-07-06 ENCOUNTER — Inpatient Hospital Stay (HOSPITAL_COMMUNITY): Payer: Medicare PPO

## 2022-07-06 DIAGNOSIS — R079 Chest pain, unspecified: Secondary | ICD-10-CM | POA: Diagnosis not present

## 2022-07-06 DIAGNOSIS — I480 Paroxysmal atrial fibrillation: Secondary | ICD-10-CM | POA: Diagnosis not present

## 2022-07-06 DIAGNOSIS — I350 Nonrheumatic aortic (valve) stenosis: Secondary | ICD-10-CM | POA: Diagnosis not present

## 2022-07-06 DIAGNOSIS — I5033 Acute on chronic diastolic (congestive) heart failure: Secondary | ICD-10-CM | POA: Diagnosis not present

## 2022-07-06 DIAGNOSIS — D6869 Other thrombophilia: Secondary | ICD-10-CM | POA: Diagnosis not present

## 2022-07-06 DIAGNOSIS — N1832 Chronic kidney disease, stage 3b: Secondary | ICD-10-CM

## 2022-07-06 LAB — COMPREHENSIVE METABOLIC PANEL
ALT: 9 U/L (ref 0–44)
AST: 21 U/L (ref 15–41)
Albumin: 3.3 g/dL — ABNORMAL LOW (ref 3.5–5.0)
Alkaline Phosphatase: 34 U/L — ABNORMAL LOW (ref 38–126)
Anion gap: 13 (ref 5–15)
BUN: 43 mg/dL — ABNORMAL HIGH (ref 8–23)
CO2: 35 mmol/L — ABNORMAL HIGH (ref 22–32)
Calcium: 9.4 mg/dL (ref 8.9–10.3)
Chloride: 90 mmol/L — ABNORMAL LOW (ref 98–111)
Creatinine, Ser: 1.72 mg/dL — ABNORMAL HIGH (ref 0.44–1.00)
GFR, Estimated: 30 mL/min — ABNORMAL LOW (ref >60)
Glucose, Bld: 106 mg/dL — ABNORMAL HIGH (ref 70–99)
Potassium: 3.5 mmol/L (ref 3.5–5.1)
Sodium: 138 mmol/L (ref 135–145)
Total Bilirubin: 0.5 mg/dL (ref 0.3–1.2)
Total Protein: 6.5 g/dL (ref 6.5–8.1)

## 2022-07-06 LAB — MAGNESIUM: Magnesium: 2.5 mg/dL — ABNORMAL HIGH (ref 1.7–2.4)

## 2022-07-06 LAB — CBC
HCT: 32.8 % — ABNORMAL LOW (ref 36.0–46.0)
Hemoglobin: 10.4 g/dL — ABNORMAL LOW (ref 12.0–15.0)
MCH: 33.7 pg (ref 26.0–34.0)
MCHC: 31.7 g/dL (ref 30.0–36.0)
MCV: 106.1 fL — ABNORMAL HIGH (ref 80.0–100.0)
Platelets: 178 10*3/uL (ref 150–400)
RBC: 3.09 MIL/uL — ABNORMAL LOW (ref 3.87–5.11)
RDW: 14 % (ref 11.5–15.5)
WBC: 7.9 10*3/uL (ref 4.0–10.5)
nRBC: 0 % (ref 0.0–0.2)

## 2022-07-06 LAB — BRAIN NATRIURETIC PEPTIDE: B Natriuretic Peptide: 212.2 pg/mL — ABNORMAL HIGH (ref 0.0–100.0)

## 2022-07-06 MED ORDER — POLYETHYLENE GLYCOL 3350 17 G PO PACK
17.0000 g | PACK | Freq: Two times a day (BID) | ORAL | Status: DC
  Administered 2022-07-06 – 2022-07-07 (×2): 17 g via ORAL
  Filled 2022-07-06 (×3): qty 1

## 2022-07-06 MED ORDER — TORSEMIDE 20 MG PO TABS
60.0000 mg | ORAL_TABLET | Freq: Two times a day (BID) | ORAL | Status: DC
  Administered 2022-07-06 – 2022-07-08 (×4): 60 mg via ORAL
  Filled 2022-07-06 (×4): qty 3

## 2022-07-06 MED ORDER — ZOLPIDEM TARTRATE 5 MG PO TABS
5.0000 mg | ORAL_TABLET | Freq: Every evening | ORAL | Status: DC | PRN
  Administered 2022-07-06 – 2022-07-07 (×2): 5 mg via ORAL
  Filled 2022-07-06 (×2): qty 1

## 2022-07-06 MED ORDER — AMLODIPINE BESYLATE 5 MG PO TABS
5.0000 mg | ORAL_TABLET | Freq: Every day | ORAL | Status: DC
  Administered 2022-07-06 – 2022-07-08 (×3): 5 mg via ORAL
  Filled 2022-07-06 (×3): qty 1

## 2022-07-06 NOTE — Progress Notes (Signed)
Rounding Note    Patient Name: [name] Date of Encounter: 07/07/2022  [place] Cardiologist: Rozann Lesches, MD   Subjective   Feels well today. Ambulated the hallways. Hoping to go home soon.  Cr improved 1.72>1.55 BNP 400>200s Transitioned back to home torsemide 60mg  BID  Inpatient Medications    Scheduled Meds:  allopurinol  300 mg Oral Daily   amLODipine  5 mg Oral Daily   apixaban  2.5 mg Oral BID   atorvastatin  20 mg Oral Daily   empagliflozin  10 mg Oral Daily   fenofibrate  160 mg Oral Daily   levothyroxine  125 mcg Oral Q0600   lidocaine  2 patch Transdermal Q24H   pantoprazole  40 mg Oral Daily   polyethylene glycol  17 g Oral BID   torsemide  60 mg Oral BID   Continuous Infusions:  PRN Meds: acetaminophen, bisacodyl, bisacodyl, ondansetron (ZOFRAN) IV, mouth rinse, oxyCODONE, zolpidem   Vital Signs    Vitals:   07/06/22 1710 07/06/22 2000 07/07/22 0449 07/07/22 1100  BP: (!) 150/68 (!) 127/53 (!) 121/49 (!) 160/62  Pulse: 67 78  77  Resp: 20 20 17 17   Temp: 98.2 F (36.8 C) 98.5 F (36.9 C) 97.8 F (36.6 C)   TempSrc: Oral Oral Oral   SpO2: 100% 99% 100% 98%  Weight:   106.4 kg   Height:        Intake/Output Summary (Last 24 hours) at 07/07/2022 1111 Last data filed at 07/07/2022 0700 Gross per 24 hour  Intake 960 ml  Output 2300 ml  Net -1340 ml      07/07/2022    4:49 AM 07/06/2022   12:26 AM 07/05/2022    1:03 AM  Last 3 Weights  Weight (lbs) 234 lb 8 oz 236 lb 232 lb 3.2 oz  Weight (kg) 106.369 kg 107.049 kg 105.325 kg      Telemetry    NSR with PACs - Personally Reviewed  ECG    No new tracing - Personally Reviewed  Physical Exam   GEN: No acute distress.  Sitting in a chair Neck: JVD difficult to assess due to body habitus Cardiac: Irregular, 2/6 systolic murmur Respiratory: Bibasilar crackles (unchanged) GI: Soft, nontender, non-distended  MS: Trace edema, warm Neuro:  Nonfocal  Psych: Normal  affect   Labs    High Sensitivity Troponin:   Recent Labs  Lab 07/01/22 0028 07/01/22 0206  TROPONINIHS 56* 54*     Chemistry Recent Labs  Lab 07/05/22 0030 07/06/22 0038 07/07/22 0104  NA 137 138 138  K 4.0 3.5 3.7  CL 87* 90* 92*  CO2 37* 35* 34*  GLUCOSE 114* 106* 105*  BUN 57* 43* 32*  CREATININE 2.12* 1.72* 1.55*  CALCIUM 9.5 9.4 9.0  MG 2.5* 2.5* 2.3  PROT 6.2* 6.5 6.2*  ALBUMIN 3.1* 3.3* 3.2*  AST 20 21 26   ALT 8 9 8   ALKPHOS 37* 34* 43  BILITOT 0.4 0.5 0.5  GFRNONAA 23* 30* 33*  ANIONGAP 13 13 12     Lipids  Recent Labs  Lab 07/02/22 0058  CHOL 164  TRIG 232*  HDL 33*  LDLCALC 85  CHOLHDL 5.0    Hematology Recent Labs  Lab 07/02/22 0058 07/04/22 0030 07/06/22 0038  WBC 8.1 10.9* 7.9  RBC 2.92* 3.33* 3.09*  HGB 10.1* 11.2* 10.4*  HCT 30.7* 35.3* 32.8*  MCV 105.1* 106.0* 106.1*  MCH 34.6* 33.6 33.7  MCHC 32.9 31.7 31.7  RDW 14.0 14.0 14.0  PLT 177 240 178   Thyroid No results for input(s): "TSH", "FREET4" in the last 168 hours.  BNP Recent Labs  Lab 07/01/22 0206 07/06/22 0037  BNP 458.2* 212.2*    DDimer  Recent Labs  Lab 07/01/22 0206  DDIMER 0.49     Radiology    DG CHEST PORT 1 VIEW  Result Date: 07/06/2022 CLINICAL DATA:  141880 SOB (shortness of breath) 141880 EXAM: PORTABLE CHEST 1 VIEW COMPARISON:  June 26, 2022 FINDINGS: The cardiomediastinal silhouette is unchanged in contour.Status post median sternotomy and CABG. Atherosclerotic calcifications. No pleural effusion. No pneumothorax. No acute pleuroparenchymal abnormality. Mildly improved aeration of bilateral bases in comparison to most recent prior. IMPRESSION: No acute cardiopulmonary abnormality. Electronically Signed   By: Meda Klinefelter M.D.   On: 07/06/2022 12:00   ECHOCARDIOGRAM LIMITED  Result Date: 07/05/2022    ECHOCARDIOGRAM LIMITED REPORT   Patient Name:   [name] Date of Exam: 07/05/2022 Medical Rec #:  [id number]   Height:       63.0 in Accession  #:    [id number]  Weight:       232.2 lb Date of Birth:  Aug 16, 1940  BSA:          2.060 m Patient Age:    81 years    BP:           153/61 mmHg Patient Gender: F           HR:           68 bpm. Exam Location:  Inpatient Procedure: Limited Echo, Cardiac Doppler, Color Doppler and Intracardiac            Opacification Agent Indications:    Cardiomyopathy, unspecified  History:        Patient has prior history of Echocardiogram examinations, most                 recent 11/27/2021. CAD, Prior CABG, COPD, Aortic Valve Disease,                 Arrythmias:Atrial Fibrillation, Signs/Symptoms:Chest Pain; Risk                 Factors:Dyslipidemia and Hypertension. S/P aortic valve                 replacement with 21 mm Edwards Magna pericardial valve.                 Procedure date 2009. CKD. HFpEF.  Sonographer:    Ross Ludwig RDCS (AE) Referring Phys: Maisie Fus  Sonographer Comments: Technically difficult study due to poor echo windows and patient is obese. IMPRESSIONS  1. Left ventricular ejection fraction, by estimation, is 65 to 70%. The left ventricle has normal function. The left ventricle has no regional wall motion abnormalities. There is moderate concentric left ventricular hypertrophy. Indeterminate diastolic filling due to E-A fusion.  2. Right ventricular systolic function was not well visualized. The right ventricular size is not well visualized. There is normal pulmonary artery systolic pressure. The estimated right ventricular systolic pressure is 12.1 mmHg.  3. The mitral valve is degenerative. Trivial mitral valve regurgitation. No evidence of mitral stenosis.  4. 21 mm Magna pericardial bioprosthetic aortic valve. Vmax 2.5 m/s, MG 14 mmHg, EOA 1.96 cm2, DI 0.62. Trivial regurgitation noted. The aortic valve has been repaired/replaced. Aortic valve regurgitation is trivial. Procedure Date: 12/10/2007.  5. The inferior vena cava is normal in size with  greater than 50% respiratory variability, suggesting  right atrial pressure of 3 mmHg. Comparison(s): No significant change from prior study. FINDINGS  Left Ventricle: Left ventricular ejection fraction, by estimation, is 65 to 70%. The left ventricle has normal function. The left ventricle has no regional wall motion abnormalities. Definity contrast agent was given IV to delineate the left ventricular  endocardial borders. The left ventricular internal cavity size was normal in size. There is moderate concentric left ventricular hypertrophy. Abnormal (paradoxical) septal motion consistent with post-operative status. Indeterminate diastolic filling due  to E-A fusion. Right Ventricle: The right ventricular size is not well visualized. Right vetricular wall thickness was not well visualized. Right ventricular systolic function was not well visualized. There is normal pulmonary artery systolic pressure. The tricuspid regurgitant velocity is 1.51 m/s, and with an assumed right atrial pressure of 3 mmHg, the estimated right ventricular systolic pressure is 23.5 mmHg. Pericardium: There is no evidence of pericardial effusion. Presence of epicardial fat layer. Mitral Valve: The mitral valve is degenerative in appearance. Mild to moderate mitral annular calcification. Trivial mitral valve regurgitation. No evidence of mitral valve stenosis. Tricuspid Valve: The tricuspid valve is grossly normal. Tricuspid valve regurgitation is trivial. No evidence of tricuspid stenosis. Aortic Valve: 21 mm Magna pericardial bioprosthetic aortic valve. Vmax 2.5 m/s, MG 14 mmHg, EOA 1.96 cm2, DI 0.62. Trivial regurgitation noted. The aortic valve has been repaired/replaced. Aortic valve regurgitation is trivial. Aortic valve mean gradient  measures 14.3 mmHg. Aortic valve peak gradient measures 26.1 mmHg. Aortic valve area, by VTI measures 1.96 cm. There is a 21 mm bioprosthetic valve present in the aortic position. Pulmonic Valve: The pulmonic valve was grossly normal. Pulmonic valve  regurgitation is trivial. No evidence of pulmonic stenosis. Venous: The inferior vena cava is normal in size with greater than 50% respiratory variability, suggesting right atrial pressure of 3 mmHg. LEFT VENTRICLE PLAX 2D LVIDd:         5.00 cm LVIDs:         3.60 cm LV PW:         1.40 cm LV IVS:        1.50 cm LVOT diam:     2.00 cm LV SV:         100 LV SV Index:   48 LVOT Area:     3.14 cm  IVC IVC diam: 1.80 cm LEFT ATRIUM         Index LA diam:    4.70 cm 2.28 cm/m  AORTIC VALVE AV Area (Vmax):    1.73 cm AV Area (Vmean):   1.71 cm AV Area (VTI):     1.96 cm AV Vmax:           255.67 cm/s AV Vmean:          173.000 cm/s AV VTI:            0.509 m AV Peak Grad:      26.1 mmHg AV Mean Grad:      14.3 mmHg LVOT Vmax:         141.00 cm/s LVOT Vmean:        94.200 cm/s LVOT VTI:          0.317 m LVOT/AV VTI ratio: 0.62  AORTA Ao Root diam: 3.10 cm TRICUSPID VALVE TR Peak grad:   9.1 mmHg TR Vmax:        151.00 cm/s  SHUNTS Systemic VTI:  0.32 m Systemic Diam: 2.00 cm Eleonore Chiquito MD Electronically  signed by Lennie Odor MD Signature Date/Time: 07/05/2022/2:47:15 PM    Final     Cardiac Studies   TTE 07/05/22: IMPRESSIONS     1. Left ventricular ejection fraction, by estimation, is 65 to 70%. The  left ventricle has normal function. The left ventricle has no regional  wall motion abnormalities. There is moderate concentric left ventricular  hypertrophy. Indeterminate diastolic  filling due to E-A fusion.   2. Right ventricular systolic function was not well visualized. The right  ventricular size is not well visualized. There is normal pulmonary artery  systolic pressure. The estimated right ventricular systolic pressure is  12.1 mmHg.   3. The mitral valve is degenerative. Trivial mitral valve regurgitation.  No evidence of mitral stenosis.   4. 21 mm Magna pericardial bioprosthetic aortic valve. Vmax 2.5 m/s, MG  14 mmHg, EOA 1.96 cm2, DI 0.62. Trivial regurgitation noted. The aortic   valve has been repaired/replaced. Aortic valve regurgitation is trivial.  Procedure Date: 12/10/2007.   5. The inferior vena cava is normal in size with greater than 50%  respiratory variability, suggesting right atrial pressure of 3 mmHg.   Comparison(s): No significant change from prior study.   Patient Profile     82 y.o. female  with history of CAD s/p CABG x2 (LIMA to LAD and SVG to RCA) in 2009, chronic HFpEF, persistent atrial fibrillation/ flutter on Eliquis, aortic stenosis s/p bioprosthetic AVR in 2009 at time of CABG, hypertension, hyperlipidemia, CKD stage IV who presented with acute on chronic HFpEF exacerbation for which Cardiology was consulted.    Assessment & Plan    #Acute on Chronic HFpEF Exacerbation: Patient presented with wrosening SOB found to have BNP in 2000s with pulmonary edema on CXR. TTE with LVEF 65-70%, moderate LVH, trivial MR, mean aortic valve gradientt , EOA 2cm2, DI 0.62. Responded well to IV diuresis now transitioned to PO. -Continue torsemide 60mg  BID -Continue jardiance 10mg  daily -Monitor I/Os and daily weights  #Junctional Rhythm: Asymptomatic and in NSR currently with frequent PACs. Metoprolol held. Will monitor.  #Severe AS s/p AVR: Well functioning prosthesis on TTE. Mean gradient . EOA 2cm2, DI 0.62.  #CAD s/p CABG: History of CABG in 2009. Trop here mildly elevated and flat. No anginal symptoms. Will continue with medical management.  #Paroxysmal Afib: In NSR with PACs today. -Off metop due to junctional rhythm -Continue apixaban 2.5mg  BID  #AKI on CKD: Baseline 1.5-1.9. Improved today to 1.5. -Monitor with diuresis  #HTN: Elevated. Losartan held due to fluctuating renal function. Started amlodipine for now and resume losartan as able. -Started amlodipine 5mg  daily -Resume home losartan once Cr stabilizes  #HLD: -Continue liptior 20mg  daily   Cardiology will sign-off. Will arrange for outpatient follow-up.      For questions or updates, please contact [facility] Please consult [email or web address] for contact info under        Signed, , MD  07/07/2022, 11:11 AM

## 2022-07-06 NOTE — Progress Notes (Signed)
  Progress Note   Patient: [name] HBZ:[id number] DOB: [birth year] DOA: 06/30/2022     6 DOS: the patient was seen and examined on 07/06/2022   Brief hospital course: 81/F with history of CAD/CABG, chronic diastolic CHF, COPD/chronic respiratory failure on 2 L home O2 persistent atrial flutter/fibrillation presented to [facility] on 1/3 with chest pain, shortness of breath, she was noted to have an elevated troponin which peaked at 361, BNP of 2056, chest x-ray with pulmonary vascular congestion, was treated with diuretics, there was a concern for NSTEMI at [facility], cardiology was consulted and she was transferred to [facility] service for further evaluation. Unfortunately she ended up staying in the ER for 4 days until she got a bed and got transferred last night. She was diuresed, had an echocardiogram which noted EF of 65% with grade 2 diastolic dysfunction, normal RV.   Assessment and Plan: Acute on chronic diastolic CHF -Echo 1/5 with EF of 10%, grade 2 diastolic dysfunction, normal RV -Diuresed at outside hospital prior to transfer, volume status improved.  -Per Cardiology, volume status better but wt up 4lbs, likely start PO diuretics pending BNP -Continued Jardiance -Increase activity, PT eval   Chest pain, elevated troponins CAD/CABG -Transferred for evaluation of this, suspect this is likely demand ischemia and not ACS -Appreciate cards input -Continue  statin, on Eliquis which is continued -Was on metoprolol, however, noted to be bradycardic to the 30's recently, thus metoprolol stopped. HR now improved  Bradycardia -HR recently into the 30's -Metoprolol now stopped -was recently mildly hyperkalemic, now resolved   Chronic respiratory failure?  COPD -On 2 L home O2 at baseline   Persistent atrial flutter/fibrillation -Continue Eliquis -metoprolol held secondary to bradycardia   AKI on CKD 3b -Creatinine on admission was 3.4 -Baseline creatinine around 1.6 -Cr down  to 1.72. Cardiology considering resuming PO diuretic   Obesity      Subjective:Without complaints, eager to go home soon  Physical Exam: Vitals:   07/05/22 1900 07/06/22 0026 07/06/22 0306 07/06/22 1200  BP: 132/67  (!) 156/58 (!) 156/59  Pulse: 66  74 66  Resp: 16  18 18   Temp: 98 F (36.7 C)  98 F (36.7 C) 97.9 F (36.6 C)  TempSrc: Oral  Oral Oral  SpO2: 98%  96% 98%  Weight:  107 kg    Height:       General exam: Conversant, in no acute distress Respiratory system: normal chest rise, clear, no audible wheezing Cardiovascular system: regular rhythm, s1-s2 Gastrointestinal system: Nondistended, nontender, pos BS Central nervous system: No seizures, no tremors Extremities: No cyanosis, no joint deformities Skin: No rashes, no pallor Psychiatry: Affect normal // no auditory hallucinations   Data Reviewed:  Labs reviewed: Na 138, K 3.5, Cr 1.72  Family Communication: Pt in room family not at bedside  Disposition: Status is: Inpatient Remains inpatient appropriate because: Severity of illness  Planned Discharge Destination: Home    Author: Marylu Lund, MD 07/06/2022 3:40 PM  For on call review [email or web address].

## 2022-07-07 DIAGNOSIS — I480 Paroxysmal atrial fibrillation: Secondary | ICD-10-CM | POA: Diagnosis not present

## 2022-07-07 DIAGNOSIS — Z952 Presence of prosthetic heart valve: Secondary | ICD-10-CM

## 2022-07-07 DIAGNOSIS — R079 Chest pain, unspecified: Secondary | ICD-10-CM | POA: Diagnosis not present

## 2022-07-07 DIAGNOSIS — I350 Nonrheumatic aortic (valve) stenosis: Secondary | ICD-10-CM | POA: Diagnosis not present

## 2022-07-07 DIAGNOSIS — I5033 Acute on chronic diastolic (congestive) heart failure: Secondary | ICD-10-CM | POA: Diagnosis not present

## 2022-07-07 LAB — MAGNESIUM: Magnesium: 2.3 mg/dL (ref 1.7–2.4)

## 2022-07-07 LAB — COMPREHENSIVE METABOLIC PANEL
ALT: 8 U/L (ref 0–44)
AST: 26 U/L (ref 15–41)
Albumin: 3.2 g/dL — ABNORMAL LOW (ref 3.5–5.0)
Alkaline Phosphatase: 43 U/L (ref 38–126)
Anion gap: 12 (ref 5–15)
BUN: 32 mg/dL — ABNORMAL HIGH (ref 8–23)
CO2: 34 mmol/L — ABNORMAL HIGH (ref 22–32)
Calcium: 9 mg/dL (ref 8.9–10.3)
Chloride: 92 mmol/L — ABNORMAL LOW (ref 98–111)
Creatinine, Ser: 1.55 mg/dL — ABNORMAL HIGH (ref 0.44–1.00)
GFR, Estimated: 33 mL/min — ABNORMAL LOW (ref >60)
Glucose, Bld: 105 mg/dL — ABNORMAL HIGH (ref 70–99)
Potassium: 3.7 mmol/L (ref 3.5–5.1)
Sodium: 138 mmol/L (ref 135–145)
Total Bilirubin: 0.5 mg/dL (ref 0.3–1.2)
Total Protein: 6.2 g/dL — ABNORMAL LOW (ref 6.5–8.1)

## 2022-07-07 NOTE — Progress Notes (Signed)
Mobility Specialist Progress Note:   07/07/22 0947  Mobility  Activity Ambulated with assistance in hallway  Level of Assistance Standby assist, set-up cues, supervision of patient - no hands on  Assistive Device Front wheel walker  Distance Ambulated (ft) 400 ft  Activity Response Tolerated well  Mobility Referral Yes  $Mobility charge 1 Mobility   Pt received in chair and eager. No complaints. Pt left in chair with all needs met and call bell in reach.   Andrey Campanile Mobility Specialist Please contact via SecureChat or  Rehab office at [phone]

## 2022-07-07 NOTE — Progress Notes (Signed)
  Progress Note   Patient: [name] HMC:[id number] DOB: 02-11-1941 DOA: 06/30/2022     7 DOS: the patient was seen and examined on 07/07/2022   Brief hospital course: 81/F with history of CAD/CABG, chronic diastolic CHF, COPD/chronic respiratory failure on 2 L home O2 persistent atrial flutter/fibrillation presented to [facility] on 1/3 with chest pain, shortness of breath, she was noted to have an elevated troponin which peaked at 361, BNP of 2056, chest x-ray with pulmonary vascular congestion, was treated with diuretics, there was a concern for NSTEMI at [facility], cardiology was consulted and she was transferred to [facility] service for further evaluation. Unfortunately she ended up staying in the ER for 4 days until she got a bed and got transferred last night. She was diuresed, had an echocardiogram which noted EF of 65% with grade 2 diastolic dysfunction, normal RV.   Assessment and Plan: Acute on chronic diastolic CHF -Echo 1/5 with EF of 66%, grade 2 diastolic dysfunction, normal RV -Diuresed at outside hospital prior to transfer, volume status improved.  -Per Cardiology, volume status better, now on oral diuretic -therapy recs for HHPT/OT   Chest pain, elevated troponins CAD/CABG -Transferred for evaluation of this, suspect this is likely demand ischemia and not ACS -Appreciate cards input -Continue  statin, on Eliquis which is continued -Was on metoprolol, however, noted to be bradycardic to the 30's recently, thus metoprolol stopped. HR now improved  Bradycardia -HR recently into the 30's -Metoprolol now stopped -was recently mildly hyperkalemic, now resolved   Chronic respiratory failure?  COPD -On 2 L home O2 at baseline   Persistent atrial flutter/fibrillation -Continue Eliquis -metoprolol held secondary to bradycardia   AKI on CKD 3b -Creatinine on admission was 3.4 -Baseline creatinine around 1.6 -Cr down to 1.55. Cardiology recs to continue losartan as Cr  stabilizes   Obesity      Subjective:No complaints this AM. Eager to go home soon  Physical Exam: Vitals:   07/06/22 1710 07/06/22 2000 07/07/22 0449 07/07/22 1100  BP: (!) 150/68 (!) 127/53 (!) 121/49 (!) 160/62  Pulse: 67 78  77  Resp: 20 20 17 17   Temp: 98.2 F (36.8 C) 98.5 F (36.9 C) 97.8 F (36.6 C)   TempSrc: Oral Oral Oral   SpO2: 100% 99% 100% 98%  Weight:   106.4 kg   Height:       General exam: Awake, laying in bed, in nad Respiratory system: Normal respiratory effort, no wheezing Cardiovascular system: regular rate, s1, s2 Gastrointestinal system: Soft, nondistended, positive BS Central nervous system: CN2-12 grossly intact, strength intact Extremities: Perfused, no clubbing Skin: Normal skin turgor, no notable skin lesions seen Psychiatry: Mood normal // no visual hallucinations   Data Reviewed:  Labs reviewed: Na 138, K 3.7, Cr 1.55  Family Communication: Pt in room family not at bedside  Disposition: Status is: Inpatient Remains inpatient appropriate because: Severity of illness  Planned Discharge Destination: Home    Author: Marylu Lund, MD 07/07/2022 2:28 PM  For on call review [email or web address].

## 2022-07-08 ENCOUNTER — Inpatient Hospital Stay (INDEPENDENT_AMBULATORY_CARE_PROVIDER_SITE_OTHER): Payer: Medicare PPO

## 2022-07-08 ENCOUNTER — Other Ambulatory Visit (HOSPITAL_COMMUNITY): Payer: Self-pay

## 2022-07-08 ENCOUNTER — Other Ambulatory Visit: Payer: Self-pay | Admitting: Cardiology

## 2022-07-08 DIAGNOSIS — R001 Bradycardia, unspecified: Secondary | ICD-10-CM

## 2022-07-08 DIAGNOSIS — R079 Chest pain, unspecified: Secondary | ICD-10-CM | POA: Diagnosis not present

## 2022-07-08 LAB — COMPREHENSIVE METABOLIC PANEL
ALT: 10 U/L (ref 0–44)
AST: 23 U/L (ref 15–41)
Albumin: 3.3 g/dL — ABNORMAL LOW (ref 3.5–5.0)
Alkaline Phosphatase: 45 U/L (ref 38–126)
Anion gap: 14 (ref 5–15)
BUN: 38 mg/dL — ABNORMAL HIGH (ref 8–23)
CO2: 33 mmol/L — ABNORMAL HIGH (ref 22–32)
Calcium: 8.5 mg/dL — ABNORMAL LOW (ref 8.9–10.3)
Chloride: 91 mmol/L — ABNORMAL LOW (ref 98–111)
Creatinine, Ser: 1.94 mg/dL — ABNORMAL HIGH (ref 0.44–1.00)
GFR, Estimated: 26 mL/min — ABNORMAL LOW (ref >60)
Glucose, Bld: 108 mg/dL — ABNORMAL HIGH (ref 70–99)
Potassium: 3.5 mmol/L (ref 3.5–5.1)
Sodium: 138 mmol/L (ref 135–145)
Total Bilirubin: 0.5 mg/dL (ref 0.3–1.2)
Total Protein: 6.2 g/dL — ABNORMAL LOW (ref 6.5–8.1)

## 2022-07-08 MED ORDER — AMLODIPINE BESYLATE 5 MG PO TABS
5.0000 mg | ORAL_TABLET | Freq: Every day | ORAL | 0 refills | Status: DC
  Filled 2022-07-08: qty 30, 30d supply, fill #0

## 2022-07-08 MED ORDER — POTASSIUM CHLORIDE CRYS ER 20 MEQ PO TBCR
40.0000 meq | EXTENDED_RELEASE_TABLET | Freq: Once | ORAL | Status: AC
  Administered 2022-07-08: 40 meq via ORAL
  Filled 2022-07-08: qty 2

## 2022-07-08 NOTE — Progress Notes (Signed)
Occupational Therapy Treatment Patient Details Name: [name] MRN: [id number] DOB: 06-Jul-1940 Today's Date: 07/08/2022   History of present illness Pt is a 82 y.o. F who presents to [facility] on 1/3 with chest pain, SOB, and noted to have elevated troponin and BNP of 2056. Chest x-ray with pulmonary vascular congestion. Concern for NSTEMI at [facility] and pt transferred to [facility] service for further evaluation. She was diuresed, had an echocardiogram which noted EF of 65% with grade 2 diastolic dysfunction, normal RV. Significant PMH: CAD/CABG, chronic diastolic CHF, COPD/chronic respiratory failure on 2 L home O2 persistent atrial flutter/fibrillation.   OT comments  Pt progressing well towards goals this session, needing supervision-min A for ADLs, and supervision for transfers with RW. Reviewed education on energy conservation strategies for home, pt verbalized understanding. Pt presenting with impairments listed below, will follow acutely. Continue to recommend HHOT at d/c.   Recommendations for follow up therapy are one component of a multi-disciplinary discharge planning process, led by the attending physician.  Recommendations may be updated based on patient status, additional functional criteria and insurance authorization.    Follow Up Recommendations  Home health OT     Assistance Recommended at Discharge Set up Supervision/Assistance  Patient can return home with the following  A little help with bathing/dressing/bathroom;Assistance with cooking/housework;Assist for transportation;Help with stairs or ramp for entrance   Equipment Recommendations  None recommended by OT (pt has all needed DME)    Recommendations for Other Services PT consult    Precautions / Restrictions Precautions Precautions: Fall;Other (comment) Precaution Comments: on 2L O2 at baseline Restrictions Weight Bearing Restrictions: No       Mobility Bed Mobility               General  bed mobility comments: up in chair upon arrival and departure    Transfers Overall transfer level: Needs assistance Equipment used: Rolling walker (2 wheels) Transfers: Sit to/from Stand Sit to Stand: Supervision                 Balance Overall balance assessment: Needs assistance Sitting-balance support: Feet supported Sitting balance-Leahy Scale: Good Sitting balance - Comments: can reach outside BOS without LOB   Standing balance support: Bilateral upper extremity supported Standing balance-Leahy Scale: Fair Standing balance comment: can stand statically without LOB                           ADL either performed or assessed with clinical judgement   ADL Overall ADL's : Needs assistance/impaired                 Upper Body Dressing : Minimal assistance;Standing Upper Body Dressing Details (indicate cue type and reason): donning gown     Toilet Transfer: Supervision/safety;Rolling walker (2 wheels);Ambulation;Regular Glass blower/designer Details (indicate cue type and reason): simulated via functional mobility         Functional mobility during ADLs: Supervision/safety;Rolling walker (2 wheels)      Extremity/Trunk Assessment Upper Extremity Assessment Upper Extremity Assessment: Generalized weakness   Lower Extremity Assessment Lower Extremity Assessment: Defer to PT evaluation        Vision   Vision Assessment?: No apparent visual deficits   Perception Perception Perception: Not tested   Praxis Praxis Praxis: Not tested    Cognition Arousal/Alertness: Awake/alert Behavior During Therapy: WFL for tasks assessed/performed Overall Cognitive Status: Within Functional Limits for tasks assessed  Exercises      Shoulder Instructions       General Comments VSS on 2L O2    Pertinent Vitals/ Pain       Pain Assessment Pain Assessment: No/denies pain  Home Living                                           Prior Functioning/Environment              Frequency  Min 2X/week        Progress Toward Goals  OT Goals(current goals can now be found in the care plan section)  Progress towards OT goals: Progressing toward goals  Acute Rehab OT Goals Patient Stated Goal: none stated OT Goal Formulation: With patient Time For Goal Achievement: 07/16/22 Potential to Achieve Goals: Good ADL Goals Pt Will Perform Lower Body Bathing: with modified independence;sit to/from stand;sitting/lateral leans;with adaptive equipment Pt Will Perform Lower Body Dressing: with modified independence;sitting/lateral leans;sit to/from stand;with adaptive equipment Pt Will Transfer to Toilet: with modified independence;ambulating;regular height toilet Pt Will Perform Tub/Shower Transfer: Tub transfer;Shower transfer;with modified independence;ambulating;shower seat  Plan Discharge plan remains appropriate;Frequency remains appropriate    Co-evaluation                 AM-PAC OT "6 Clicks" Daily Activity     Outcome Measure   Help from another person eating meals?: None Help from another person taking care of personal grooming?: A Little Help from another person toileting, which includes using toliet, bedpan, or urinal?: A Little Help from another person bathing (including washing, rinsing, drying)?: A Lot Help from another person to put on and taking off regular upper body clothing?: A Little Help from another person to put on and taking off regular lower body clothing?: A Lot 6 Click Score: 17    End of Session Equipment Utilized During Treatment: Gait belt;Rolling walker (2 wheels);Oxygen  OT Visit Diagnosis: Other abnormalities of gait and mobility (R26.89);Unsteadiness on feet (R26.81);Muscle weakness (generalized) (M62.81);History of falling (Z91.81)   Activity Tolerance Patient tolerated treatment well   Patient Left with call  bell/phone within reach;in chair;with family/visitor present   Nurse Communication Mobility status        Time: 4098-1191 OT Time Calculation (min): 17 min  Charges: OT General Charges $OT Visit: 1 Visit OT Treatments $Therapeutic Activity: 8-22 mins  Renaye Rakers, OTD, OTR/L SecureChat Preferred Acute Rehab [phone] - [place] 07/08/2022, 9:57 AM

## 2022-07-08 NOTE — Progress Notes (Signed)
14 day monitor for evaluation of bradycardia-- to be read by Dr. Domenic Polite (primary Cardiologist)

## 2022-07-08 NOTE — Discharge Summary (Signed)
Physician Discharge Summary   Patient: [name] MRN: [id number] DOB: 1941/06/08  Admit date:     06/30/2022  Discharge date: 07/08/22  Discharge Physician: Rickey Barbara   PCP: Ignatius Specking, MD   Recommendations at discharge:    Follow up with PCP in 1-2 weeks Follow up with Cardiology as schedule Recommend repeat BMET in 1 week, resume ARB as renal function allows Follow up with outpatient monitoring per Cardiology  Discharge Diagnoses: Principal Problem:   Chest pain  Resolved Problems:   * No resolved hospital problems. [facility] Course: 81/F with history of CAD/CABG, chronic diastolic CHF, COPD/chronic respiratory failure on 2 L home O2 persistent atrial flutter/fibrillation presented to [facility] on 1/3 with chest pain, shortness of breath, she was noted to have an elevated troponin which peaked at 361, BNP of 2056, chest x-ray with pulmonary vascular congestion, was treated with diuretics, there was a concern for NSTEMI at [facility], cardiology was consulted and she was transferred to [facility] service for further evaluation. Unfortunately she ended up staying in the ER for 4 days until she got a bed and got transferred last night. She was diuresed, had an echocardiogram which noted EF of 65% with grade 2 diastolic dysfunction, normal RV.   Assessment and Plan: Acute on chronic diastolic CHF -Echo 1/5 with EF of 65%, grade 2 diastolic dysfunction, normal RV -Diuresed at outside hospital prior to transfer, volume status improved.  -Per Cardiology, volume status better, now on oral diuretic -therapy recs for HHPT/OT -Cr noted to be 1.9 at discharge, hold ARB on d/c . Would resume as renal function tolerates. Recommend rechecking bmet in 1 week   Chest pain, elevated troponins CAD/CABG -Transferred for evaluation of this, suspect this is likely demand ischemia and not ACS -Appreciate cards input -Continue  statin, on Eliquis which is continued -Was on metoprolol,  however, noted to be bradycardic to the 30's recently, thus metoprolol stopped. HR initially improved, later noted to have HR in the upper 30-mid-40's -Pt ambulated with HR up to 50's, asymptomatic . Discussed with Cardiology. OK to d/c home with Cardiology arranging close outpt f/u   Bradycardia -Metoprolol now stopped -per above, pt to follow up closely with Cardiology as outpatient   Chronic respiratory failure?  COPD -On 2 L home O2 at baseline   Persistent atrial flutter/fibrillation -Continue Eliquis -metoprolol held secondary to bradycardia   AKI on CKD 3b -Creatinine on admission was 3.4 -Baseline creatinine around 1.6 -Cr noted to be 1.9 at time of d/c -Advised pt to cont to hold ARB at d/c. Cardiology recs to continue losartan as Cr stabilizes down the road -Recommend rechecking bmet in 1 week   Obesity       Consultants: Cardiology Procedures performed:   Disposition: Home Diet recommendation:  Cardiac diet DISCHARGE MEDICATION: Allergies as of 07/08/2022       Reactions   Statins    REACTION: MUSCLE ACHE   Penicillins Swelling, Rash        Medication List     STOP taking these medications    losartan 25 MG tablet Commonly known as: COZAAR   metolazone 5 MG tablet Commonly known as: ZAROXOLYN   metoprolol succinate 25 MG 24 hr tablet Commonly known as: TOPROL-XL       TAKE these medications    acetaminophen 650 MG CR tablet Commonly known as: TYLENOL Take 650 mg by mouth every 8 (eight) hours as needed for pain.   allopurinol 300  MG tablet Commonly known as: ZYLOPRIM Take 300 mg by mouth daily.   amLODipine 5 MG tablet Commonly known as: NORVASC Take 1 tablet (5 mg total) by mouth daily. Start taking on: July 09, 2022   atorvastatin 20 MG tablet Commonly known as: LIPITOR Take 20 mg by mouth daily.   CENTRUM SILVER PO Take 1 tablet by mouth at bedtime.   docusate sodium 100 MG capsule Commonly known as: COLACE Take 100 mg  by mouth at bedtime.   Eliquis 2.5 MG Tabs tablet Generic drug: apixaban TAKE 1 TABLET BY MOUTH TWICE DAILY   empagliflozin 10 MG Tabs tablet Commonly known as: Jardiance Take 1 tablet (10 mg total) by mouth daily before breakfast.   fenofibrate 145 MG tablet Commonly known as: TRICOR Take 145 mg by mouth daily.   ferrous sulfate 325 (65 FE) MG EC tablet Take 325 mg by mouth at bedtime.   levothyroxine 125 MCG tablet Commonly known as: SYNTHROID Take 125 mcg by mouth daily before breakfast.   pantoprazole 40 MG tablet Commonly known as: PROTONIX Take 40 mg by mouth daily.   potassium chloride SA 20 MEQ tablet Commonly known as: KLOR-CON M Take 1 tablet (20 mEq total) by mouth daily. Take 1 extra pill on Wednesday with metolazone   torsemide 20 MG tablet Commonly known as: DEMADEX Take 3 tablets (60 mg total) by mouth 2 (two) times daily.   Vitamin D (Ergocalciferol) 1.25 MG (50000 UNIT) Caps capsule Commonly known as: DRISDOL Take 50,000 Units by mouth every Sunday.        Follow-up Information     [facility]. Go in 12 day(s).   Specialty: Cardiology Why: Hospital follow up 07/15/22 PLEASE bring a current medication list to appointment FREE valet parking, Entrance C, off [employer] information: [address] [id number] [place], [facility] Follow up.   Specialty: [place] Why: Agency will call you to set up apt times Contact information: [place] [id number] [phone]         Finis Bud, NP Follow up.   Specialty: Cardiology Why: Hospital follow-up with General Cardiology scheduled for 07/29/2022 at 10:30am. Please arrive 15 minutes early for check-in. If this date/time does not work for you, please call our office to reschedule this. Contact information: [place] [address] [phone]         Glenda Chroman, MD Follow up in 2 week(s).   Specialty: Internal Medicine Why: Hospital follow up Contact information: [place] [address] [phone]                Discharge Exam: Filed Weights   07/06/22 0026 07/07/22 0449 07/08/22 0040  Weight: 107 kg 106.4 kg 107.1 kg   General exam: Awake, laying in bed, in nad Respiratory system: Normal respiratory effort, no wheezing Cardiovascular system: regular rate, s1, s2 Gastrointestinal system: Soft, nondistended, positive BS Central nervous system: CN2-12 grossly intact, strength intact Extremities: Perfused, no clubbing Skin: Normal skin turgor, no notable skin lesions seen Psychiatry: Mood normal // no visual hallucinations   Condition at discharge: fair  The results of significant diagnostics from this hospitalization (including imaging, microbiology, ancillary and laboratory) are listed below for reference.   Imaging Studies: DG CHEST PORT 1 VIEW  Result Date: 07/06/2022 CLINICAL DATA:  [id number]  SOB (shortness of breath) [id number] EXAM: PORTABLE CHEST 1 VIEW COMPARISON:  June 26, 2022 FINDINGS: The cardiomediastinal silhouette is unchanged in contour.Status post median sternotomy and CABG. Atherosclerotic calcifications. No pleural effusion. No pneumothorax. No acute pleuroparenchymal abnormality. Mildly improved aeration of bilateral bases in comparison to most recent prior. IMPRESSION: No acute cardiopulmonary abnormality. Electronically Signed   By: Valentino Saxon M.D.   On: 07/06/2022 12:00   ECHOCARDIOGRAM LIMITED  Result Date: 07/05/2022    ECHOCARDIOGRAM LIMITED REPORT   Patient Name:   [name] Date of Exam: 07/05/2022 Medical Rec #:  [id number]   Height:       63.0 in Accession #:    [id number]  Weight:       232.2 lb Date of Birth:  August 24, 1940  BSA:          2.060 m Patient Age:    46 years    BP:           153/61 mmHg Patient Gender: F           HR:            68 bpm. Exam Location:  Inpatient Procedure: Limited Echo, Cardiac Doppler, Color Doppler and Intracardiac            Opacification Agent Indications:    Cardiomyopathy, unspecified  History:        Patient has prior history of Echocardiogram examinations, most                 recent 11/27/2021. CAD, Prior CABG, COPD, Aortic Valve Disease,                 Arrythmias:Atrial Fibrillation, Signs/Symptoms:Chest Pain; Risk                 Factors:Dyslipidemia and Hypertension. S/P aortic valve                 replacement with 21 mm Edwards Magna pericardial valve.                 Procedure date 2009. CKD. HFpEF.  Sonographer:    Clayton Lefort RDCS (AE) Referring Phys: Janina Mayo  Sonographer Comments: Technically difficult study due to poor echo windows and patient is obese. IMPRESSIONS  1. Left ventricular ejection fraction, by estimation, is 65 to 70%. The left ventricle has normal function. The left ventricle has no regional wall motion abnormalities. There is moderate concentric left ventricular hypertrophy. Indeterminate diastolic filling due to E-A fusion.  2. Right ventricular systolic function was not well visualized. The right ventricular size is not well visualized. There is normal pulmonary artery systolic pressure. The estimated right ventricular systolic pressure is 09.9 mmHg.  3. The mitral valve is degenerative. Trivial mitral valve regurgitation. No evidence of mitral stenosis.  4. 21 mm Magna pericardial bioprosthetic aortic valve. Vmax 2.5 m/s, MG 14 mmHg, EOA 1.96 cm2, DI 0.62. Trivial regurgitation noted. The aortic valve has been repaired/replaced. Aortic valve regurgitation is trivial. Procedure Date: 12/10/2007.  5. The inferior vena cava is normal in size with greater than 50% respiratory variability, suggesting right atrial pressure of 3 mmHg. Comparison(s): No significant change from prior study. FINDINGS  Left Ventricle: Left ventricular ejection fraction, by estimation, is 65 to 70%. The  left ventricle has normal function. The left ventricle has no regional wall motion abnormalities. Definity contrast agent was given IV to delineate the left ventricular  endocardial borders. The left ventricular internal cavity size was normal in  size. There is moderate concentric left ventricular hypertrophy. Abnormal (paradoxical) septal motion consistent with post-operative status. Indeterminate diastolic filling due  to E-A fusion. Right Ventricle: The right ventricular size is not well visualized. Right vetricular wall thickness was not well visualized. Right ventricular systolic function was not well visualized. There is normal pulmonary artery systolic pressure. The tricuspid regurgitant velocity is 1.51 m/s, and with an assumed right atrial pressure of 3 mmHg, the estimated right ventricular systolic pressure is 12.1 mmHg. Pericardium: There is no evidence of pericardial effusion. Presence of epicardial fat layer. Mitral Valve: The mitral valve is degenerative in appearance. Mild to moderate mitral annular calcification. Trivial mitral valve regurgitation. No evidence of mitral valve stenosis. Tricuspid Valve: The tricuspid valve is grossly normal. Tricuspid valve regurgitation is trivial. No evidence of tricuspid stenosis. Aortic Valve: 21 mm Magna pericardial bioprosthetic aortic valve. Vmax 2.5 m/s, MG 14 mmHg, EOA 1.96 cm2, DI 0.62. Trivial regurgitation noted. The aortic valve has been repaired/replaced. Aortic valve regurgitation is trivial. Aortic valve mean gradient  measures 14.3 mmHg. Aortic valve peak gradient measures 26.1 mmHg. Aortic valve area, by VTI measures 1.96 cm. There is a 21 mm bioprosthetic valve present in the aortic position. Pulmonic Valve: The pulmonic valve was grossly normal. Pulmonic valve regurgitation is trivial. No evidence of pulmonic stenosis. Venous: The inferior vena cava is normal in size with greater than 50% respiratory variability, suggesting right atrial pressure  of 3 mmHg. LEFT VENTRICLE PLAX 2D LVIDd:         5.00 cm LVIDs:         3.60 cm LV PW:         1.40 cm LV IVS:        1.50 cm LVOT diam:     2.00 cm LV SV:         100 LV SV Index:   48 LVOT Area:     3.14 cm  IVC IVC diam: 1.80 cm LEFT ATRIUM         Index LA diam:    4.70 cm 2.28 cm/m  AORTIC VALVE AV Area (Vmax):    1.73 cm AV Area (Vmean):   1.71 cm AV Area (VTI):     1.96 cm AV Vmax:           255.67 cm/s AV Vmean:          173.000 cm/s AV VTI:            0.509 m AV Peak Grad:      26.1 mmHg AV Mean Grad:      14.3 mmHg LVOT Vmax:         141.00 cm/s LVOT Vmean:        94.200 cm/s LVOT VTI:          0.317 m LVOT/AV VTI ratio: 0.62  AORTA Ao Root diam: 3.10 cm TRICUSPID VALVE TR Peak grad:   9.1 mmHg TR Vmax:        151.00 cm/s  SHUNTS Systemic VTI:  0.32 m Systemic Diam: 2.00 cm Lennie Odor MD Electronically signed by Lennie Odor MD Signature Date/Time: 07/05/2022/2:47:15 PM    Final     Microbiology: Results for orders placed or performed during the hospital encounter of 01/01/21  SARS CORONAVIRUS 2 (TAT 6-24 HRS) Nasopharyngeal Nasopharyngeal Swab     Status: None   Collection Time: 01/01/21  8:24 AM   Specimen: Nasopharyngeal Swab  Result Value Ref Range Status   SARS Coronavirus 2 NEGATIVE NEGATIVE  Final    Comment: (NOTE) SARS-CoV-2 target nucleic acids are NOT DETECTED.  The SARS-CoV-2 RNA is generally detectable in upper and lower respiratory specimens during the acute phase of infection. Negative results do not preclude SARS-CoV-2 infection, do not rule out co-infections with other pathogens, and should not be used as the sole basis for treatment or other patient management decisions. Negative results must be combined with clinical observations, patient history, and epidemiological information. The expected result is Negative.  Fact Sheet for Patients: HairSlick.no  Fact Sheet for Healthcare  Providers: quierodirigir.com  This test is not yet approved or cleared by the [place] FDA and  has been authorized for detection and/or diagnosis of SARS-CoV-2 by FDA under an Emergency Use Authorization (EUA). This EUA will remain  in effect (meaning this test can be used) for the duration of the COVID-19 declaration under Se ction 564(b)(1) of the Act, 21 U.S.C. section 360bbb-3(b)(1), unless the authorization is terminated or revoked sooner.  Performed at [facility], [address], [place], [place] [address]     Labs: CBC: Recent Labs  Lab 07/02/22 0058 07/04/22 0030 07/06/22 0038  WBC 8.1 10.9* 7.9  HGB 10.1* 11.2* 10.4*  HCT 30.7* 35.3* 32.8*  MCV 105.1* 106.0* 106.1*  PLT 177 240 178   Basic Metabolic Panel: Recent Labs  Lab 07/03/22 0022 07/04/22 0030 07/04/22 1742 07/05/22 0030 07/06/22 0038 07/07/22 0104 07/08/22 0051  NA 138   < > 134* 137 138 138 138  K 3.7   < > 3.9 4.0 3.5 3.7 3.5  CL 84*   < > 85* 87* 90* 92* 91*  CO2 41*   < > 36* 37* 35* 34* 33*  GLUCOSE 114*   < > 120* 114* 106* 105* 108*  BUN 55*   < > 53* 57* 43* 32* 38*  CREATININE 1.70*   < > 2.04* 2.12* 1.72* 1.55* 1.94*  CALCIUM 9.2   < > 9.8 9.5 9.4 9.0 8.5*  MG 1.9  --  2.5* 2.5* 2.5* 2.3  --    < > = values in this interval not displayed.   Liver Function Tests: Recent Labs  Lab 07/04/22 0030 07/05/22 0030 07/06/22 0038 07/07/22 0104 07/08/22 0051  AST 37 20 21 26 23   ALT 9 8 9 8 10   ALKPHOS 38 37* 34* 43 45  BILITOT 0.9 0.4 0.5 0.5 0.5  PROT 6.8 6.2* 6.5 6.2* 6.2*  ALBUMIN 3.4* 3.1* 3.3* 3.2* 3.3*   CBG: No results for input(s): "GLUCAP" in the last 168 hours.  Discharge time spent: less than 30 minutes.  Signed: , MD [facility] 07/08/2022

## 2022-07-08 NOTE — Progress Notes (Signed)
   Heart Failure Stewardship Pharmacist Progress Note   PCP: Glenda Chroman, MD PCP-Cardiologist: Rozann Lesches, MD    HPI:  82 yo F with PMH of CAD s/p CABG and AVR in 2009, CHF, CKD IV, COPD, and afib.  She presented to [facility] on 1/3 with chest pain and shortness of breath. Found to have elevated troponin and BNP. CXR with pulmonary vascular congestion. Recived IV diuretics. ECHO showed LVEF 65% with G2DD and normal RV. Transferred to [facility] on 1/7 for further evaluation. No plan for LHC with renal dysfunction.   Discharge HF Medications: Diuretic: torsemide 60 mg BID SGLT2i: Jardiance 10 mg daily  Prior to admission HF Medications: Diuretic: torsemide 60 mg daily + metolazone 5 mg once weekly Beta blocker: metoprolol XL 25 mg daily ACE/ARB/ARNI: losartan 12.5 mg daily SGLT2i: Jardiance 10 mg daily  Pertinent Lab Values: Serum creatinine 1.94, BUN 38, Potassium 3.5, Sodium 138, Magnesium 2.3, BNP 2302>>458.2  Vital Signs: Weight: 236 lbs  Blood pressure: 140-160/50s Heart rate: 40-60s  I/O: incomplete  Medication Assistance / Insurance Benefits Check: Does the patient have prescription insurance?  Yes Type of insurance plan: [place] Medicare  Outpatient Pharmacy:  Prior to admission outpatient pharmacy: [facility] Is the patient willing to use [facility] at discharge? Yes Is the patient willing to transition their outpatient pharmacy to utilize a [facility] outpatient pharmacy?   Pending   Assessment: 1. Acute on chronic diastolic CHF (LVEF 73%). NYHA class III symptoms. - Agree with torsemide 60 mg BID at discharge. Strict I/Os and daily weights. Keep K>4 and Mag>2. - Stopped metoprolol XL with bradycardia - Consider resuming losartan at follow up once creatinine stabilizes.  - Continue Jardiance 10 mg daily. eGFR 20-30.   Plan: 1) Medication changes recommended at this time: - Agree with changes  2) Patient assistance: - None pending  3)   Education  - Patient has been educated on current HF medications and potential additions to HF medication regimen - Patient verbalizes understanding that over the next few months, these medication doses may change and more medications may be added to optimize HF regimen - Patient has been educated on basic disease state pathophysiology and goals of therapy   Kerby Nora, PharmD, BCPS Heart Failure Stewardship Pharmacist Phone [phone]

## 2022-07-08 NOTE — TOC Transition Note (Signed)
Transition of Care [facility]) - CM/SW Discharge Note   Patient Details  Name: [name] MRN: [id number] Date of Birth: Apr 16, 1941  [facility]) CM/SW Contact:  Zenon Mayo, RN Phone Number: 07/08/2022, 12:05 PM   Clinical Narrative:    Patient is for dc today, NCM notified Tommi Rumps with [facility].  Patient has transport.   Final next level of care: Home w Home Health Services Barriers to Discharge: Continued Medical Work up   Patient Goals and CMS Choice CMS Medicare.gov Compare Post Acute Care list provided to:: Patient Choice offered to / list presented to : Patient  Discharge Placement                         Discharge Plan and Services Additional resources added to the After Visit Summary for       Post Acute Care Choice: Home Health          DME Arranged: N/A         HH Arranged: PT, OT HH Agency: [place] Date [facility] Agency Contacted: 07/02/22 Time [place]: [id number] Representative spoke with at [place]: [place] Determinants of Health ([place]) Interventions SDOH Screenings   Food Insecurity: No Food Insecurity (06/30/2022)  Housing: Low Risk  (06/30/2022)  Transportation Needs: No Transportation Needs (06/30/2022)  Utilities: Not At Risk (06/30/2022)  Alcohol Screen: Low Risk  (07/01/2022)  Financial Resource Strain: Low Risk  (07/01/2022)  Tobacco Use: Medium Risk (07/05/2022)     Readmission Risk Interventions     No data to display

## 2022-07-08 NOTE — Progress Notes (Unsigned)
Enrolled for [employer] to mail a ZIO XT long term holter monitor to the patients address on file.   Dr. Sam McDowell to read. 

## 2022-07-09 ENCOUNTER — Other Ambulatory Visit: Payer: Self-pay | Admitting: Student

## 2022-07-09 ENCOUNTER — Other Ambulatory Visit: Payer: Self-pay | Admitting: Cardiology

## 2022-07-09 NOTE — Telephone Encounter (Signed)
Prescription refill request for Eliquis received. Indication: AF Last office visit: 04/18/22  Myles Gip MD Scr: 1.94 on 07/08/21 Age: 82 Weight: 109kg

## 2022-07-10 ENCOUNTER — Other Ambulatory Visit: Payer: Self-pay | Admitting: Cardiology

## 2022-07-11 DIAGNOSIS — I5033 Acute on chronic diastolic (congestive) heart failure: Secondary | ICD-10-CM | POA: Diagnosis not present

## 2022-07-11 DIAGNOSIS — E782 Mixed hyperlipidemia: Secondary | ICD-10-CM | POA: Diagnosis not present

## 2022-07-11 DIAGNOSIS — R001 Bradycardia, unspecified: Secondary | ICD-10-CM | POA: Diagnosis not present

## 2022-07-11 DIAGNOSIS — I2510 Atherosclerotic heart disease of native coronary artery without angina pectoris: Secondary | ICD-10-CM | POA: Diagnosis not present

## 2022-07-11 DIAGNOSIS — K219 Gastro-esophageal reflux disease without esophagitis: Secondary | ICD-10-CM | POA: Diagnosis not present

## 2022-07-11 DIAGNOSIS — I480 Paroxysmal atrial fibrillation: Secondary | ICD-10-CM | POA: Diagnosis not present

## 2022-07-11 DIAGNOSIS — I4510 Unspecified right bundle-branch block: Secondary | ICD-10-CM | POA: Diagnosis not present

## 2022-07-11 DIAGNOSIS — I130 Hypertensive heart and chronic kidney disease with heart failure and stage 1 through stage 4 chronic kidney disease, or unspecified chronic kidney disease: Secondary | ICD-10-CM | POA: Diagnosis not present

## 2022-07-11 DIAGNOSIS — N1832 Chronic kidney disease, stage 3b: Secondary | ICD-10-CM | POA: Diagnosis not present

## 2022-07-11 DIAGNOSIS — E890 Postprocedural hypothyroidism: Secondary | ICD-10-CM | POA: Diagnosis not present

## 2022-07-12 ENCOUNTER — Telehealth (HOSPITAL_COMMUNITY): Payer: Self-pay

## 2022-07-12 DIAGNOSIS — Z09 Encounter for follow-up examination after completed treatment for conditions other than malignant neoplasm: Secondary | ICD-10-CM | POA: Diagnosis not present

## 2022-07-12 DIAGNOSIS — I10 Essential (primary) hypertension: Secondary | ICD-10-CM | POA: Diagnosis not present

## 2022-07-12 DIAGNOSIS — Z299 Encounter for prophylactic measures, unspecified: Secondary | ICD-10-CM | POA: Diagnosis not present

## 2022-07-12 DIAGNOSIS — I25119 Atherosclerotic heart disease of native coronary artery with unspecified angina pectoris: Secondary | ICD-10-CM | POA: Diagnosis not present

## 2022-07-12 DIAGNOSIS — I5032 Chronic diastolic (congestive) heart failure: Secondary | ICD-10-CM | POA: Diagnosis not present

## 2022-07-12 DIAGNOSIS — N1832 Chronic kidney disease, stage 3b: Secondary | ICD-10-CM | POA: Diagnosis not present

## 2022-07-12 DIAGNOSIS — I4891 Unspecified atrial fibrillation: Secondary | ICD-10-CM | POA: Diagnosis not present

## 2022-07-12 NOTE — Telephone Encounter (Signed)
Called to confirm Heart & Vascular Transitions of Care appointment at 07/15/22. Patient reminded to bring all medications and pill box organizer with them. Confirmed patient has transportation. Gave directions, instructed to utilize valet parking.  Confirmed appointment prior to ending call.   

## 2022-07-15 ENCOUNTER — Other Ambulatory Visit (HOSPITAL_COMMUNITY): Payer: Self-pay

## 2022-07-15 ENCOUNTER — Encounter (HOSPITAL_COMMUNITY): Payer: Medicare PPO

## 2022-07-15 DIAGNOSIS — R0602 Shortness of breath: Secondary | ICD-10-CM | POA: Diagnosis not present

## 2022-07-15 DIAGNOSIS — I5020 Unspecified systolic (congestive) heart failure: Secondary | ICD-10-CM | POA: Diagnosis not present

## 2022-07-15 MED ORDER — AMLODIPINE BESYLATE 5 MG PO TABS: 5.0000 mg | ORAL_TABLET | Freq: Every day | ORAL | 0 refills | Status: DC

## 2022-07-15 NOTE — Progress Notes (Incomplete)
HEART & VASCULAR TRANSITION OF CARE CONSULT NOTE     Referring Physician: Dr. Broadus John  Primary Care: Glenda Chroman, MD Primary Cardiologist: Rozann Lesches, MD   HPI: Referred to clinic by Dr. Broadus John for heart failure consultation.   82 y.o. female  with history of CAD s/p CABG x2 (LIMA to LAD and SVG to RCA) in 2009, chronic HFpEF, persistent atrial fibrillation/ flutter on Eliquis, aortic stenosis s/p bioprosthetic AVR in 2009 at time of CABG, hypertension, hyperlipidemia and CKD stage IV.  Presented to [facility] on 1/3 with chest pain, shortness of breath, she was noted to have an elevated troponin which peaked at 361, BNP of 2056, chest x-ray with pulmonary vascular congestion. In NSR on admit. Transferred to [facility] for further care. Treated for a/c HFpEF. CP felt 2/2 volume overload and trop elevation felt due to demand ischemia from CHF. Echo showed EF of 65% with grade 2 diastolic dysfunction, normal RV. Well functioning AoV prosthesis, mean gradient 72mmHg, EOA 2cm2, DI 0.62. She was diuresed w/ IV Lasix and and continued on Jardiance. Developed AKI on CKD w/ bump in SCr from 1.5>>1.9.  Losartan was discontinued.  Also required discontinuation of metoprolol due to junctional bradycardia (asymptomatic). Amlodipine added for BP control. After diuresis, was transitioned back to PO torsemide, 60 mg bid. Discharge wt 234 lb. Referred to [facility] clinic.   She presents today for f/u.    Cardiac Testing   2D Echo 1/24 IMPRESSIONS     1. Left ventricular ejection fraction, by estimation, is 65 to 70%. The  left ventricle has normal function. The left ventricle has no regional  wall motion abnormalities. There is moderate concentric left ventricular  hypertrophy. Indeterminate diastolic  filling due to E-A fusion.   2. Right ventricular systolic function was not well visualized. The right  ventricular size is not well visualized. There is normal pulmonary artery  systolic pressure.  The estimated right ventricular systolic pressure is  64.4 mmHg.   3. The mitral valve is degenerative. Trivial mitral valve regurgitation.  No evidence of mitral stenosis.   4. 21 mm Magna pericardial bioprosthetic aortic valve. Vmax 2.5 m/s, MG  14 mmHg, EOA 1.96 cm2, DI 0.62. Trivial regurgitation noted. The aortic  valve has been repaired/replaced. Aortic valve regurgitation is trivial.  Procedure Date: 12/10/2007.   5. The inferior vena cava is normal in size with greater than 50%  respiratory variability, suggesting right atrial pressure of 3 mmHg.   Comparison(s): No significant change from prior study.      Review of Systems: [y] = yes, [ ]  = no   General: Weight gain [ ] ; Weight loss [ ] ; Anorexia [ ] ; Fatigue [ ] ; Fever [ ] ; Chills [ ] ; Weakness [ ]   Cardiac: Chest pain/pressure [ ] ; Resting SOB [ ] ; Exertional SOB [ ] ; Orthopnea [ ] ; Pedal Edema [ ] ; Palpitations [ ] ; Syncope [ ] ; Presyncope [ ] ; Paroxysmal nocturnal dyspnea[ ]   Pulmonary: Cough [ ] ; Wheezing[ ] ; Hemoptysis[ ] ; Sputum [ ] ; Snoring [ ]   GI: Vomiting[ ] ; Dysphagia[ ] ; Melena[ ] ; Hematochezia [ ] ; Heartburn[ ] ; Abdominal pain [ ] ; Constipation [ ] ; Diarrhea [ ] ; BRBPR [ ]   GU: Hematuria[ ] ; Dysuria [ ] ; Nocturia[ ]   Vascular: Pain in legs with walking [ ] ; Pain in feet with lying flat [ ] ; Non-healing sores [ ] ; Stroke [ ] ; TIA [ ] ; Slurred speech [ ] ;  Neuro: Headaches[ ] ; Vertigo[ ] ; Seizures[ ] ; Paresthesias[ ] ;Blurred  vision [ ] ; Diplopia [ ] ; Vision changes [ ]   Ortho/Skin: Arthritis [ ] ; Joint pain [ ] ; Muscle pain [ ] ; Joint swelling [ ] ; Back Pain [ ] ; Rash [ ]   Psych: Depression[ ] ; Anxiety[ ]   Heme: Bleeding problems [ ] ; Clotting disorders [ ] ; Anemia [ ]   Endocrine: Diabetes [ ] ; Thyroid dysfunction[ ]    Past Medical History:  Diagnosis Date   Aortic stenosis    Arthritis    Atrial fibrillation (HCC)    Coronary atherosclerosis of native coronary artery    Multivessel   Essential hypertension     Gout    Hypothyroidism    Mixed hyperlipidemia    Psoriasis     Current Outpatient Medications  Medication Sig Dispense Refill   acetaminophen (TYLENOL) 650 MG CR tablet Take 650 mg by mouth every 8 (eight) hours as needed for pain.     allopurinol (ZYLOPRIM) 300 MG tablet Take 300 mg by mouth daily.  3   amLODipine (NORVASC) 5 MG tablet Take 1 tablet (5 mg total) by mouth daily. 30 tablet 0   atorvastatin (LIPITOR) 20 MG tablet Take 20 mg by mouth daily.     docusate sodium (COLACE) 100 MG capsule Take 100 mg by mouth at bedtime.     ELIQUIS 2.5 MG TABS tablet TAKE 1 TABLET BY MOUTH TWICE DAILY 60 tablet 5   empagliflozin (JARDIANCE) 10 MG TABS tablet Take 1 tablet (10 mg total) by mouth daily before breakfast. 30 tablet 6   fenofibrate (TRICOR) 145 MG tablet Take 145 mg by mouth daily.     ferrous sulfate 325 (65 FE) MG EC tablet Take 325 mg by mouth at bedtime.     levothyroxine (SYNTHROID, LEVOTHROID) 125 MCG tablet Take 125 mcg by mouth daily before breakfast.     losartan (COZAAR) 25 MG tablet TAKE 1/2 TABLET BY MOUTH DAILY 45 tablet 1   Multiple Vitamins-Minerals (CENTRUM SILVER PO) Take 1 tablet by mouth at bedtime. (Patient not taking: Reported on 07/01/2022)     pantoprazole (PROTONIX) 40 MG tablet Take 40 mg by mouth daily.  4   potassium chloride SA (KLOR-CON M) 20 MEQ tablet Take 1 tablet (20 mEq total) by mouth daily. 30 tablet 6   torsemide (DEMADEX) 20 MG tablet Take 3 tablets (60 mg total) by mouth 2 (two) times daily. 180 tablet 6   Vitamin D, Ergocalciferol, (DRISDOL) 50000 UNITS CAPS Take 50,000 Units by mouth every Sunday.     No current facility-administered medications for this visit.    Allergies  Allergen Reactions   Statins     REACTION: MUSCLE ACHE   Penicillins Swelling and Rash      Social History   Socioeconomic History   Marital status: Widowed    Spouse name: Not on file   Number of children: Not on file   Years of education: Not on file    Highest education level: High school graduate  Occupational History   Occupation: Retired  Tobacco Use   Smoking status: Former    Packs/day: 0.50    Years: 15.00    Total pack years: 7.50    Types: Cigarettes    Quit date: 06/27/1989    Years since quitting: 33.0    Passive exposure: Never   Smokeless tobacco: Never  Vaping Use   Vaping Use: Never used  Substance and Sexual Activity   Alcohol use: No    Alcohol/week: 0.0 standard drinks of alcohol   Drug use: No  Sexual activity: Not on file  Other Topics Concern   Not on file  Social History Narrative   Not on file   Social Determinants of Health   Financial Resource Strain: Low Risk  (07/01/2022)   Overall Financial Resource Strain (CARDIA)    Difficulty of Paying Living Expenses: Not very hard  Food Insecurity: No Food Insecurity (06/30/2022)   Hunger Vital Sign    Worried About Running Out of Food in the Last Year: Never true    Ran Out of Food in the Last Year: Never true  Transportation Needs: No Transportation Needs (06/30/2022)   PRAPARE - Administrator, Civil Service (Medical): No    Lack of Transportation (Non-Medical): No  Physical Activity: Not on file  Stress: Not on file  Social Connections: Not on file  Intimate Partner Violence: Not At Risk (06/30/2022)   Humiliation, Afraid, Rape, and Kick questionnaire    Fear of Current or Ex-Partner: No    Emotionally Abused: No    Physically Abused: No    Sexually Abused: No      Family History  Problem Relation Age of Onset   Aneurysm Father        Abdominal   Multiple sclerosis Sister     There were no vitals filed for this visit.  PHYSICAL EXAM: General:  Well appearing. No respiratory difficulty HEENT: normal Neck: supple. no JVD. Carotids 2+ bilat; no bruits. No lymphadenopathy or thryomegaly appreciated. Cor: PMI nondisplaced. Regular rate & rhythm. No rubs, gallops or murmurs. Lungs: clear Abdomen: soft, nontender, nondistended. No  hepatosplenomegaly. No bruits or masses. Good bowel sounds. Extremities: no cyanosis, clubbing, rash, edema Neuro: alert & oriented x 3, cranial nerves grossly intact. moves all 4 extremities w/o difficulty. Affect pleasant.  ECG:   ASSESSMENT & PLAN:   1. Chronic Diastolic Heart Failure - Echo 3/82 EF 65% with grade 2 diastolic dysfunction, normal RV - Jardiance 10 mg  - Torsemide 60 mg bid   2. CAD - s/p CABG x 2 (LIMA-LAD, SVG- RCA) in 2009 - stable w/o CP - on statin therapy w/ atorva 20  - no ASA given need for Eliquis - off ? blocker w/ recent bradycardia   3. H/o Aortic Stenosis, S/p AVR - at time of CABG in 2009 - echo 1/24 w/ well functioning AoV prosthesis, mean gradient , EOA 2cm2, DI 0.62  4. Paroxsymal Atrial Fibrillation  - off ? blocker due to bradycardia  - on eliquis 2.5 mg bid (reduce dose due to age and renal fx)   5. Junctional Bradycardia  - metoprolol recently discontinued - EKG today shows   6. Hypertension   7. CKD IIIB - Check BMP today   NYHA *** GDMT  Diuretic- BB- Ace/ARB/ARNI MRA SGLT2i    Referred to HFSW (PCP, Medications, Transportation, ETOH Abuse, Drug Abuse, Insurance, Financial ): Yes or No Refer to Pharmacy: Yes or No Refer to Home Health: Yes on No Refer to Advanced Heart Failure Clinic: Yes or no  Refer to General Cardiology: Yes or No  Follow up

## 2022-07-16 DIAGNOSIS — E782 Mixed hyperlipidemia: Secondary | ICD-10-CM | POA: Diagnosis not present

## 2022-07-16 DIAGNOSIS — I4510 Unspecified right bundle-branch block: Secondary | ICD-10-CM | POA: Diagnosis not present

## 2022-07-16 DIAGNOSIS — E890 Postprocedural hypothyroidism: Secondary | ICD-10-CM | POA: Diagnosis not present

## 2022-07-16 DIAGNOSIS — K219 Gastro-esophageal reflux disease without esophagitis: Secondary | ICD-10-CM | POA: Diagnosis not present

## 2022-07-16 DIAGNOSIS — I130 Hypertensive heart and chronic kidney disease with heart failure and stage 1 through stage 4 chronic kidney disease, or unspecified chronic kidney disease: Secondary | ICD-10-CM | POA: Diagnosis not present

## 2022-07-16 DIAGNOSIS — I480 Paroxysmal atrial fibrillation: Secondary | ICD-10-CM | POA: Diagnosis not present

## 2022-07-16 DIAGNOSIS — N1832 Chronic kidney disease, stage 3b: Secondary | ICD-10-CM | POA: Diagnosis not present

## 2022-07-16 DIAGNOSIS — I5033 Acute on chronic diastolic (congestive) heart failure: Secondary | ICD-10-CM | POA: Diagnosis not present

## 2022-07-16 DIAGNOSIS — I2510 Atherosclerotic heart disease of native coronary artery without angina pectoris: Secondary | ICD-10-CM | POA: Diagnosis not present

## 2022-07-17 DIAGNOSIS — N1832 Chronic kidney disease, stage 3b: Secondary | ICD-10-CM | POA: Diagnosis not present

## 2022-07-17 DIAGNOSIS — I5033 Acute on chronic diastolic (congestive) heart failure: Secondary | ICD-10-CM | POA: Diagnosis not present

## 2022-07-17 DIAGNOSIS — I130 Hypertensive heart and chronic kidney disease with heart failure and stage 1 through stage 4 chronic kidney disease, or unspecified chronic kidney disease: Secondary | ICD-10-CM | POA: Diagnosis not present

## 2022-07-17 DIAGNOSIS — I2510 Atherosclerotic heart disease of native coronary artery without angina pectoris: Secondary | ICD-10-CM | POA: Diagnosis not present

## 2022-07-18 ENCOUNTER — Telehealth (HOSPITAL_COMMUNITY): Payer: Self-pay

## 2022-07-18 NOTE — Telephone Encounter (Signed)
Called to confirm Heart & Vascular Transitions of Care appointment at 07/22/22. Patient reminded to bring all medications and pill box organizer with them. Gave directions, instructed to utilize valet parking. Left message to confirm appointment. 

## 2022-07-22 ENCOUNTER — Encounter (HOSPITAL_COMMUNITY): Payer: Self-pay

## 2022-07-22 ENCOUNTER — Ambulatory Visit (HOSPITAL_COMMUNITY)
Admission: RE | Admit: 2022-07-22 | Discharge: 2022-07-22 | Disposition: A | Discharge status: Home / Self Care | Payer: Medicare PPO | Source: Ambulatory Visit | Attending: Adult Health | Admitting: Adult Health

## 2022-07-22 VITALS — BP 110/70 | HR 80 | Wt 235.0 lb

## 2022-07-22 DIAGNOSIS — N184 Chronic kidney disease, stage 4 (severe): Secondary | ICD-10-CM | POA: Insufficient documentation

## 2022-07-22 DIAGNOSIS — J9610 Chronic respiratory failure, unspecified whether with hypoxia or hypercapnia: Secondary | ICD-10-CM | POA: Insufficient documentation

## 2022-07-22 DIAGNOSIS — I480 Paroxysmal atrial fibrillation: Secondary | ICD-10-CM | POA: Diagnosis not present

## 2022-07-22 DIAGNOSIS — J9612 Chronic respiratory failure with hypercapnia: Secondary | ICD-10-CM

## 2022-07-22 DIAGNOSIS — J9611 Chronic respiratory failure with hypoxia: Secondary | ICD-10-CM | POA: Diagnosis not present

## 2022-07-22 DIAGNOSIS — Z79899 Other long term (current) drug therapy: Secondary | ICD-10-CM | POA: Insufficient documentation

## 2022-07-22 DIAGNOSIS — Z9889 Other specified postprocedural states: Secondary | ICD-10-CM | POA: Insufficient documentation

## 2022-07-22 DIAGNOSIS — I5032 Chronic diastolic (congestive) heart failure: Secondary | ICD-10-CM

## 2022-07-22 DIAGNOSIS — I352 Nonrheumatic aortic (valve) stenosis with insufficiency: Secondary | ICD-10-CM | POA: Diagnosis not present

## 2022-07-22 DIAGNOSIS — Z951 Presence of aortocoronary bypass graft: Secondary | ICD-10-CM | POA: Insufficient documentation

## 2022-07-22 DIAGNOSIS — Z7901 Long term (current) use of anticoagulants: Secondary | ICD-10-CM | POA: Insufficient documentation

## 2022-07-22 DIAGNOSIS — N1832 Chronic kidney disease, stage 3b: Secondary | ICD-10-CM

## 2022-07-22 DIAGNOSIS — I130 Hypertensive heart and chronic kidney disease with heart failure and stage 1 through stage 4 chronic kidney disease, or unspecified chronic kidney disease: Secondary | ICD-10-CM | POA: Diagnosis not present

## 2022-07-22 DIAGNOSIS — I2510 Atherosclerotic heart disease of native coronary artery without angina pectoris: Secondary | ICD-10-CM | POA: Insufficient documentation

## 2022-07-22 DIAGNOSIS — Z87891 Personal history of nicotine dependence: Secondary | ICD-10-CM | POA: Diagnosis not present

## 2022-07-22 DIAGNOSIS — Z953 Presence of xenogenic heart valve: Secondary | ICD-10-CM | POA: Diagnosis not present

## 2022-07-22 LAB — BASIC METABOLIC PANEL
Anion gap: 11 (ref 5–15)
BUN: 37 mg/dL — ABNORMAL HIGH (ref 8–23)
CO2: 35 mmol/L — ABNORMAL HIGH (ref 22–32)
Calcium: 9.8 mg/dL (ref 8.9–10.3)
Chloride: 93 mmol/L — ABNORMAL LOW (ref 98–111)
Creatinine, Ser: 1.78 mg/dL — ABNORMAL HIGH (ref 0.44–1.00)
GFR, Estimated: 28 mL/min — ABNORMAL LOW (ref >60)
Glucose, Bld: 108 mg/dL — ABNORMAL HIGH (ref 70–99)
Potassium: 4.8 mmol/L (ref 3.5–5.1)
Sodium: 139 mmol/L (ref 135–145)

## 2022-07-22 MED ORDER — TORSEMIDE 20 MG PO TABS: 60.0000 mg | ORAL_TABLET | Freq: Two times a day (BID) | ORAL | 0 refills | Status: DC

## 2022-07-22 NOTE — Progress Notes (Signed)
ReDS Vest / Clip - 07/22/22 1100       ReDS Vest / Clip   Station Marker B    Ruler Value 38    ReDS Value Range Low volume    ReDS Actual Value 31

## 2022-07-22 NOTE — Patient Instructions (Addendum)
Labs done today. We will contact you only if your labs are abnormal. You may take 1 extra Torsemide 20mg  (1 tablet) by mouth as needed. No more than 2 extra tablets per week.  No other medication changes were made. Please continue all current medications as prescribed.  Thank you for allowing [place] to provide your heart failure care after your recent hospitalization. Please follow-up with General Cardiology.

## 2022-07-22 NOTE — Progress Notes (Signed)
HEART & VASCULAR TRANSITION OF CARE CONSULT NOTE     Referring Physician: Dr. Broadus John  Primary Care: Glenda Chroman, MD Primary Cardiologist: Rozann Lesches, MD   HPI: Referred to clinic by Dr. Broadus John for heart failure consultation.   Ms [name] is a 82 y.o. female  with history of CAD s/p CABG x2 (LIMA to LAD and SVG to RCA) in 2009, chronic HFpEF, persistent atrial fibrillation/ flutter on Eliquis, aortic stenosis s/p bioprosthetic AVR in 2009 at time of CABG, hypertension, hyperlipidemia and CKD stage IV.  Presented to [facility] on 1/3 with chest pain, shortness of breath, she was noted to have an elevated troponin which peaked at 361, BNP of 2056, chest x-ray with pulmonary vascular congestion. In NSR on admit. Transferred to [facility] for further care. Treated for a/c HFpEF. CP felt 2/2 volume overload and trop elevation felt due to demand ischemia from CHF. Echo showed EF of 65% with grade 2 diastolic dysfunction, normal RV. Well functioning AoV prosthesis, mean gradient 4mmHg, EOA 2cm2, DI 0.62. She was diuresed w/ IV Lasix and and continued on Jardiance. Developed AKI on CKD w/ bump in SCr from 1.5>>1.9.  Losartan was discontinued.  Also required discontinuation of metoprolol due to junctional bradycardia (asymptomatic). Amlodipine added for BP control. After diuresis, was transitioned back to PO torsemide, 60 mg bid. Discharge wt 234 lb. Referred to [facility] clinic.   She presents today for f/u with her HCPOA and a friend. Overall feeling fine. Intermittently short of breath. Wears 2 liters [place]. Denies PND/Orthopnea. Appetite ok. No fever or chills. Weight at home   233-237 pounds. Wears 2 liters [place]. Taking all medications.   Cardiac Testing   2D Echo 06/2022 IMPRESSIONS   1. Left ventricular ejection fraction, by estimation, is 65 to 70%. The  left ventricle has normal function. The left ventricle has no regional  wall motion abnormalities. There is moderate concentric left ventricular   hypertrophy. Indeterminate diastolic  filling due to E-A fusion.   2. Right ventricular systolic function was not well visualized. The right  ventricular size is not well visualized. There is normal pulmonary artery  systolic pressure. The estimated right ventricular systolic pressure is  23.5 mmHg.   3. The mitral valve is degenerative. Trivial mitral valve regurgitation.  No evidence of mitral stenosis.   4. 21 mm Magna pericardial bioprosthetic aortic valve. Vmax 2.5 m/s, MG  14 mmHg, EOA 1.96 cm2, DI 0.62. Trivial regurgitation noted. The aortic  valve has been repaired/replaced. Aortic valve regurgitation is trivial.  Procedure Date: 12/10/2007.   5. The inferior vena cava is normal in size with greater than 50%  respiratory variability, suggesting right atrial pressure of 3 mmHg.   Comparison(s): No significant change from prior study.      Review of Systems: [y] = yes, [ ]  = no   General: Weight gain [ ] ; Weight loss [ ] ; Anorexia [ ] ; Fatigue [ ] ; Fever [ ] ; Chills [ ] ; Weakness [Y ]  Cardiac: Chest pain/pressure [ ] ; Resting SOB [ ] ; Exertional SOB [Y ]; Orthopnea [ ] ; Pedal Edema [ Y]; Palpitations [ ] ; Syncope [ ] ; Presyncope [ ] ; Paroxysmal nocturnal dyspnea[ ]   Pulmonary: Cough [ ] ; Wheezing[ ] ; Hemoptysis[ ] ; Sputum [ ] ; Snoring [ ]   GI: Vomiting[ ] ; Dysphagia[ ] ; Melena[ ] ; Hematochezia [ ] ; Heartburn[ ] ; Abdominal pain [ ] ; Constipation [ ] ; Diarrhea [ ] ; BRBPR [ ]   GU: Hematuria[ ] ; Dysuria [ ] ; Nocturia[ ]   Vascular: Pain in legs with walking [ ] ; Pain in feet with lying flat [ ] ; Non-healing sores [ ] ; Stroke [ ] ; TIA [ ] ; Slurred speech [ ] ;  Neuro: Headaches[ ] ; Vertigo[ ] ; Seizures[ ] ; Paresthesias[ ] ;Blurred vision [ ] ; Diplopia [ ] ; Vision changes [ ]   Ortho/Skin: Arthritis [ ] ; Joint pain [Y ]; Muscle pain [ ] ; Joint swelling [ ] ; Back Pain [ Y]; Rash [ ]   Psych: Depression[ ] ; Anxiety[ ]   Heme: Bleeding problems [ ] ; Clotting disorders [ ] ; Anemia [ ]    Endocrine: Diabetes [ ] ; Thyroid dysfunction[Y ]   Past Medical History:  Diagnosis Date   Aortic stenosis    Arthritis    Atrial fibrillation (HCC)    Coronary atherosclerosis of native coronary artery    Multivessel   Essential hypertension    Gout    Hypothyroidism    Mixed hyperlipidemia    Psoriasis     Current Outpatient Medications  Medication Sig Dispense Refill   acetaminophen (TYLENOL) 650 MG CR tablet Take 650 mg by mouth every 8 (eight) hours as needed for pain.     allopurinol (ZYLOPRIM) 300 MG tablet Take 300 mg by mouth daily.  3   amLODipine (NORVASC) 5 MG tablet Take 1 tablet (5 mg total) by mouth daily. 30 tablet 0   atorvastatin (LIPITOR) 20 MG tablet Take 20 mg by mouth daily.     docusate sodium (COLACE) 100 MG capsule Take 100 mg by mouth at bedtime.     ELIQUIS 2.5 MG TABS tablet TAKE 1 TABLET BY MOUTH TWICE DAILY 60 tablet 5   empagliflozin (JARDIANCE) 10 MG TABS tablet Take 1 tablet (10 mg total) by mouth daily before breakfast. 30 tablet 6   fenofibrate (TRICOR) 145 MG tablet Take 145 mg by mouth daily.     ferrous sulfate 325 (65 FE) MG EC tablet Take 325 mg by mouth at bedtime.     levothyroxine (SYNTHROID, LEVOTHROID) 125 MCG tablet Take 125 mcg by mouth daily before breakfast.     Multiple Vitamins-Minerals (CENTRUM SILVER PO) Take 1 tablet by mouth at bedtime.     pantoprazole (PROTONIX) 40 MG tablet Take 40 mg by mouth daily.  4   potassium chloride SA (KLOR-CON M) 20 MEQ tablet Take 1 tablet (20 mEq total) by mouth daily. 30 tablet 6   torsemide (DEMADEX) 20 MG tablet Take 3 tablets (60 mg total) by mouth 2 (two) times daily. 180 tablet 6   Vitamin D, Ergocalciferol, (DRISDOL) 50000 UNITS CAPS Take 50,000 Units by mouth every Sunday.     No current facility-administered medications for this encounter.    Allergies  Allergen Reactions   Statins     REACTION: MUSCLE ACHE   Penicillins Swelling and Rash      Social History    Socioeconomic History   Marital status: Widowed    Spouse name: Not on file   Number of children: Not on file   Years of education: Not on file   Highest education level: High school graduate  Occupational History   Occupation: Retired  Tobacco Use   Smoking status: Former    Packs/day: 0.50    Years: 15.00    Total pack years: 7.50    Types: Cigarettes    Quit date: 06/27/1989    Years since quitting: 33.0    Passive exposure: Never   Smokeless tobacco: Never  Vaping Use   Vaping Use: Never used  Substance and Sexual Activity  Alcohol use: No    Alcohol/week: 0.0 standard drinks of alcohol   Drug use: No   Sexual activity: Not on file  Other Topics Concern   Not on file  Social History Narrative   Not on file   Social Determinants of Health   Financial Resource Strain: Low Risk  (07/01/2022)   Overall Financial Resource Strain (CARDIA)    Difficulty of Paying Living Expenses: Not very hard  Food Insecurity: No Food Insecurity (06/30/2022)   Hunger Vital Sign    Worried About Running Out of Food in the Last Year: Never true    Ran Out of Food in the Last Year: Never true  Transportation Needs: No Transportation Needs (06/30/2022)   PRAPARE - Administrator, Civil Service (Medical): No    Lack of Transportation (Non-Medical): No  Physical Activity: Not on file  Stress: Not on file  Social Connections: Not on file  Intimate Partner Violence: Not At Risk (06/30/2022)   Humiliation, Afraid, Rape, and Kick questionnaire    Fear of Current or Ex-Partner: No    Emotionally Abused: No    Physically Abused: No    Sexually Abused: No      Family History  Problem Relation Age of Onset   Aneurysm Father        Abdominal   Multiple sclerosis Sister     Vitals:   07/22/22 1108  BP: 110/70  Pulse: 80  SpO2: 98%  Weight: 106.6 kg (235 lb)   Wt Readings from Last 3 Encounters:  07/22/22 106.6 kg (235 lb)  07/08/22 107.1 kg (236 lb 1.6 oz)  04/18/22 109 kg  (240 lb 3.2 oz)    PHYSICAL EXAM: General:  Well appearing. No respiratory difficulty HEENT: normal Neck: supple. no JVD. Carotids 2+ bilat; no bruits. No lymphadenopathy or thryomegaly appreciated. Cor: PMI nondisplaced. Regular rate & rhythm. No rubs, gallops or murmurs.[place] Zio XT  Lungs: clear Abdomen: soft, nontender, nondistended. No hepatosplenomegaly. No bruits or masses. Good bowel sounds. Extremities: no cyanosis, clubbing, rash, edema Neuro: alert & oriented x 3, cranial nerves grossly intact. moves all 4 extremities w/o difficulty. Affect pleasant.     ASSESSMENT & PLAN:   1. Chronic Diastolic Heart Failure - Echo 4/09 EF 65% with grade 2 diastolic dysfunction, normal RV - NYHA III, chronically  - Volume status stable. Reds Clip 31%.  - Jardiance 10 mg daily  - Torsemide 60 mg bid and can take extra 20 mg up to twice a week.  - Discussed low salt food choices and limiting fluid intake to < 2 liters per day.  - We discussed Furoscix but she does not want to pursue at this time.   2. CAD - s/p CABG x 2 (LIMA-LAD, SVG- RCA) in 2009 - stable w/o CP - on statin therapy w/ atorva 20  - no ASA given need for Eliquis - off ? blocker w/ recent bradycardia   3. H/o Aortic Stenosis, S/p AVR - at time of CABG in 2009 - echo 1/24 w/ well functioning AoV prosthesis, mean gradient , EOA 2cm2, DI 0.62  4. Paroxsymal Atrial Fibrillation  - off ? blocker due to bradycardia  - on eliquis 2.5 mg bid (reduce dose due to age and renal fx)  - Wearing Zio XT  5. Junctional Bradycardia  - metoprolol recently discontinued  6. Hypertension  -Stable. Continue current regimen.   7. CKD IIIB - Check BMET   8. Chronic Respiratory Failure ON 2 liters  [place]. Sats stable.     Referred to HFSW (PCP, Medications, Transportation, ETOH Abuse, Drug Abuse, Insurance, Financial ):  No Refer to Pharmacy:  No Refer to Home Health:  No Refer to Advanced Heart Failure Clinic: No    Refer to General Cardiology: She is established with Dr Domenic Polite  Follow up as needed.   Kenn Rekowski NP-C  1:39 PM

## 2022-07-23 DIAGNOSIS — E782 Mixed hyperlipidemia: Secondary | ICD-10-CM | POA: Diagnosis not present

## 2022-07-23 DIAGNOSIS — I480 Paroxysmal atrial fibrillation: Secondary | ICD-10-CM | POA: Diagnosis not present

## 2022-07-23 DIAGNOSIS — I4510 Unspecified right bundle-branch block: Secondary | ICD-10-CM | POA: Diagnosis not present

## 2022-07-23 DIAGNOSIS — I130 Hypertensive heart and chronic kidney disease with heart failure and stage 1 through stage 4 chronic kidney disease, or unspecified chronic kidney disease: Secondary | ICD-10-CM | POA: Diagnosis not present

## 2022-07-23 DIAGNOSIS — I5033 Acute on chronic diastolic (congestive) heart failure: Secondary | ICD-10-CM | POA: Diagnosis not present

## 2022-07-23 DIAGNOSIS — E890 Postprocedural hypothyroidism: Secondary | ICD-10-CM | POA: Diagnosis not present

## 2022-07-23 DIAGNOSIS — I2510 Atherosclerotic heart disease of native coronary artery without angina pectoris: Secondary | ICD-10-CM | POA: Diagnosis not present

## 2022-07-23 DIAGNOSIS — N1832 Chronic kidney disease, stage 3b: Secondary | ICD-10-CM | POA: Diagnosis not present

## 2022-07-23 DIAGNOSIS — K219 Gastro-esophageal reflux disease without esophagitis: Secondary | ICD-10-CM | POA: Diagnosis not present

## 2022-07-29 ENCOUNTER — Telehealth: Payer: Self-pay | Admitting: Cardiology

## 2022-07-29 ENCOUNTER — Encounter: Payer: Self-pay | Admitting: Nurse Practitioner

## 2022-07-29 ENCOUNTER — Ambulatory Visit: Payer: Medicare PPO | Attending: Nurse Practitioner | Admitting: Nurse Practitioner

## 2022-07-29 VITALS — BP 137/62 | HR 68 | Ht 63.0 in | Wt 237.0 lb

## 2022-07-29 DIAGNOSIS — I5033 Acute on chronic diastolic (congestive) heart failure: Secondary | ICD-10-CM | POA: Diagnosis not present

## 2022-07-29 DIAGNOSIS — I2510 Atherosclerotic heart disease of native coronary artery without angina pectoris: Secondary | ICD-10-CM

## 2022-07-29 DIAGNOSIS — J449 Chronic obstructive pulmonary disease, unspecified: Secondary | ICD-10-CM

## 2022-07-29 DIAGNOSIS — N1832 Chronic kidney disease, stage 3b: Secondary | ICD-10-CM

## 2022-07-29 DIAGNOSIS — I4819 Other persistent atrial fibrillation: Secondary | ICD-10-CM | POA: Diagnosis not present

## 2022-07-29 DIAGNOSIS — R0989 Other specified symptoms and signs involving the circulatory and respiratory systems: Secondary | ICD-10-CM

## 2022-07-29 DIAGNOSIS — E785 Hyperlipidemia, unspecified: Secondary | ICD-10-CM

## 2022-07-29 DIAGNOSIS — R0609 Other forms of dyspnea: Secondary | ICD-10-CM | POA: Diagnosis not present

## 2022-07-29 DIAGNOSIS — I10 Essential (primary) hypertension: Secondary | ICD-10-CM | POA: Diagnosis not present

## 2022-07-29 DIAGNOSIS — N184 Chronic kidney disease, stage 4 (severe): Secondary | ICD-10-CM

## 2022-07-29 DIAGNOSIS — Z953 Presence of xenogenic heart valve: Secondary | ICD-10-CM

## 2022-07-29 MED ORDER — AMLODIPINE BESYLATE 5 MG PO TABS: 5.0000 mg | ORAL_TABLET | Freq: Every day | ORAL | 3 refills | Status: DC

## 2022-07-29 NOTE — Telephone Encounter (Signed)
Pt c/o medication issue:  1. Name of Medication:   torsemide (DEMADEX) 20 MG tablet  potassium chloride SA (KLOR-CON M) 20 MEQ tablet   2. How are you currently taking this medication (dosage and times per day)? As prescribed  3. Are you having a reaction (difficulty breathing--STAT)?   4. What is your medication issue?   Caller stated patient was prescribed 80 mgs of torsemide and 40 MEQ of potassium for 3 days but prescriptions was not sent to pharmacy ([place], [place]).

## 2022-07-29 NOTE — Progress Notes (Unsigned)
Cardiology Office Note:    Date: 07/29/2022  ID:  [name], DOB 1941/02/08, MRN [id number]  PCP:  Glenda Chroman, MD   [facility] Cardiologist:  Rozann Lesches, MD     Referring MD: Glenda Chroman, MD   CC: Here for hospital follow-up  History of Present Illness:    [name] is a 82 y.o. female with a hx of the following:  CAD HFpEF Persistent A-fib Hypertension Hyperlipidemia COPD AS, s/p bioprosthetic aortic valve replacement  Patient is a very pleasant 82 year old female with past medical history as mentioned above.  Has a remote history of CABG x 2 with SVG-RCA and LIMA-LAD as well as AVR in 2009.  NST in 2019 was negative for ischemia.  Echocardiogram in June 2023 showed normal EF, with dynamic LVOT and moderate symmetrical LVH.  Aortic valve was stable with normal function and mild stenosis/regurgitation.  Last seen by Dr. Domenic Polite on April 18, 2022.  She was doing well at that time.  Was still using supplemental oxygen.  No medication changes were made.  Was told to follow-up in 6 months.  She presented to [facility] in January with shortness of breath despite home use of O2 at 2 to 3 L and had associated chest pressure.  EKG was negative for acute ischemic changes.  Troponins were elevated with peak at 2,302.  Chest x-ray revealed cardiomegaly with mild pulmonary vascular congestion mild by basilar and mid right lung atelectasis.  She has been noting increasing DOE over the past month.  She was transferred to [facility] due to concern for NSTEMI.  Diuresed in the ED.  Echocardiogram revealed EF 65 to 70%, grade 2 DD, stable aortic valve.  Patient stayed in ER at [facility] for 4 days, she was diuresed.  Volume status improved.  Therapy recommended home health PT/OT.  Creatinine was noted to be 1.9 at discharge and would hold ARB on discharge and would resume as renal function tolerates.  It was recommended to recheck BMET in 1 week.  Metoprolol  was stopped due to patient being bradycardic with heart rate in 30s recently.  Was recommended to follow-up with outpatient cardiology.  Today she presents for hospital follow-up.  Family states her weight is up 5.5 lbs in 2 days. DBP has been running low. Patient says she feels fine, sometimes has shaking, sporadic and has been ongoing for a little over a month. Dyspnea on exertion. Denies any chest pain, palpitations, syncope, presyncope, dizziness, orthopnea, PND, swelling or significant weight changes, acute bleeding, or claudication. Tolerating her medications well.   Past Medical History:  Diagnosis Date   Aortic stenosis    Arthritis    Atrial fibrillation ([place])    Coronary atherosclerosis of native coronary artery    Multivessel   Essential hypertension    Gout    Hypothyroidism    Mixed hyperlipidemia    Psoriasis     Past Surgical History:  Procedure Laterality Date   AORTIC VALVE REPLACEMENT  2009   21 mm Edwards Magna pericardial valve   CARDIOVERSION N/A 10/20/2020   Procedure: CARDIOVERSION;  Surgeon: Satira Sark, MD;  Location: [facility];  Service: Cardiovascular;  Laterality: N/A;   CHOLECYSTECTOMY     2011   CORONARY ARTERY BYPASS GRAFT  2009   LIMA to LAD, SVG to RCA   JOINT REPLACEMENT  2012   L TOTAL KNEE   TOTAL KNEE ARTHROPLASTY  09/16/2011   Procedure: TOTAL KNEE  ARTHROPLASTY;  Surgeon: Loanne Drilling, MD;  Location: [facility];  Service: Orthopedics;  Laterality: Right;   TOTAL THYROIDECTOMY  1970    Current Medications: Current Meds  Medication Sig   acetaminophen (TYLENOL) 650 MG CR tablet Take 650 mg by mouth every 8 (eight) hours as needed for pain.   allopurinol (ZYLOPRIM) 300 MG tablet Take 300 mg by mouth daily.   atorvastatin (LIPITOR) 20 MG tablet Take 20 mg by mouth daily.   docusate sodium (COLACE) 100 MG capsule Take 100 mg by mouth at bedtime.   ELIQUIS 2.5 MG TABS tablet TAKE 1 TABLET BY MOUTH TWICE DAILY   empagliflozin (JARDIANCE) 10  MG TABS tablet Take 1 tablet (10 mg total) by mouth daily before breakfast.   fenofibrate (TRICOR) 145 MG tablet Take 145 mg by mouth daily.   ferrous sulfate 325 (65 FE) MG EC tablet Take 325 mg by mouth at bedtime.   levothyroxine (SYNTHROID, LEVOTHROID) 125 MCG tablet Take 125 mcg by mouth daily before breakfast.   Multiple Vitamins-Minerals (CENTRUM SILVER PO) Take 1 tablet by mouth at bedtime.   pantoprazole (PROTONIX) 40 MG tablet Take 40 mg by mouth daily.   potassium chloride SA (KLOR-CON M) 20 MEQ tablet Take 1 tablet (20 mEq total) by mouth daily.   torsemide (DEMADEX) 20 MG tablet Take 3 tablets (60 mg total) by mouth 2 (two) times daily. May take 1 extra tablet as needed. No more than 2 extra in 1 week.   Vitamin D, Ergocalciferol, (DRISDOL) 50000 UNITS CAPS Take 50,000 Units by mouth every Sunday.   amLODipine (NORVASC) 5 MG tablet Take 1 tablet (5 mg total) by mouth daily.     Allergies:   Statins and Penicillins   Social History   Socioeconomic History   Marital status: Widowed    Spouse name: Not on file   Number of children: Not on file   Years of education: Not on file   Highest education level: High school graduate  Occupational History   Occupation: Retired  Tobacco Use   Smoking status: Former    Packs/day: 0.50    Years: 15.00    Total pack years: 7.50    Types: Cigarettes    Quit date: 06/27/1989    Years since quitting: 33.1    Passive exposure: Never   Smokeless tobacco: Never  Vaping Use   Vaping Use: Never used  Substance and Sexual Activity   Alcohol use: No    Alcohol/week: 0.0 standard drinks of alcohol   Drug use: No   Sexual activity: Not on file  Other Topics Concern   Not on file  Social History Narrative   Not on file   Social Determinants of Health   Financial Resource Strain: Low Risk  (07/01/2022)   Overall Financial Resource Strain (CARDIA)    Difficulty of Paying Living Expenses: Not very hard  Food Insecurity: No Food Insecurity  (06/30/2022)   Hunger Vital Sign    Worried About Running Out of Food in the Last Year: Never true    Ran Out of Food in the Last Year: Never true  Transportation Needs: No Transportation Needs (06/30/2022)   PRAPARE - Administrator, Civil Service (Medical): No    Lack of Transportation (Non-Medical): No  Physical Activity: Not on file  Stress: Not on file  Social Connections: Not on file     Family History: The patient's family history includes Aneurysm in her father; Multiple sclerosis in her sister.  ROS:   Review of Systems  Constitutional: Negative.   HENT: Negative.    Eyes: Negative.   Respiratory:  Positive for shortness of breath. Negative for cough, hemoptysis, sputum production and wheezing.   Cardiovascular:  Positive for leg swelling. Negative for chest pain, palpitations, orthopnea, claudication and PND.  Gastrointestinal: Negative.   Genitourinary: Negative.   Musculoskeletal: Negative.   Skin: Negative.   Neurological: Negative.   Endo/Heme/Allergies: Negative.   Psychiatric/Behavioral: Negative.      Please see the history of present illness.    All other systems reviewed and are negative.  EKGs/Labs/Other Studies Reviewed:    The following studies were reviewed today:   EKG:  EKG is not ordered today.    Cardiac monitoring results pending  Limited Echo 07/05/2022:  1. Left ventricular ejection fraction, by estimation, is 65 to 70%. The  left ventricle has normal function. The left ventricle has no regional  wall motion abnormalities. There is moderate concentric left ventricular  hypertrophy. Indeterminate diastolic  filling due to E-A fusion.   2. Right ventricular systolic function was not well visualized. The right  ventricular size is not well visualized. There is normal pulmonary artery  systolic pressure. The estimated right ventricular systolic pressure is  12.1 mmHg.   3. The mitral valve is degenerative. Trivial mitral valve  regurgitation.  No evidence of mitral stenosis.   4. 21 mm Magna pericardial bioprosthetic aortic valve. Vmax 2.5 m/s, MG  14 mmHg, EOA 1.96 cm2, DI 0.62. Trivial regurgitation noted. The aortic  valve has been repaired/replaced. Aortic valve regurgitation is trivial.  Procedure Date: 12/10/2007.   5. The inferior vena cava is normal in size with greater than 50%  respiratory variability, suggesting right atrial pressure of 3 mmHg.   Comparison(s): No significant change from prior study.   Lexiscan on 12/23/2017: There was no ST segment deviation noted during stress. Sinus rhythm with right bundle branch block and frequent PACs seen throughout study. Defect 1: There is a defect present in the mid anterior and apical anterior location. Images appear to be improved with stress consistent with reversed redistribution. There are no significant ischemic territories. This is a low risk study. Nuclear stress EF: 48%.  Recent Labs: 07/06/2022: B Natriuretic Peptide 212.2; Hemoglobin 10.4; Platelets 178 07/07/2022: Magnesium 2.3 07/08/2022: ALT 10 07/30/2022: BUN 35; Creatinine, Ser 1.68; Potassium 3.8; Sodium 140  Recent Lipid Panel    Component Value Date/Time   CHOL 164 07/02/2022 0058   TRIG 232 (H) 07/02/2022 0058   HDL 33 (L) 07/02/2022 0058   CHOLHDL 5.0 07/02/2022 0058   VLDL 46 (H) 07/02/2022 0058   LDLCALC 85 07/02/2022 0058     Risk Assessment/Calculations:    CHA2DS2-VASc Score = 6  This indicates a 9.7% annual risk of stroke. The patient's score is based upon: CHF History: 1 HTN History: 1 Diabetes History: 0 Stroke History: 0 Vascular Disease History: 1 Age Score: 2 Gender Score: 1   Physical Exam:    VS:  BP 137/62 (BP Location: Right Arm, Patient Position: Sitting, Cuff Size: Normal)   Pulse 68   Ht 5\' 3"  (1.6 m)   Wt 237 lb (107.5 kg)   LMP  (LMP Unknown)   SpO2 90%   BMI 41.98 kg/m     Wt Readings from Last 3 Encounters:  07/29/22 237 lb (107.5 kg)   07/22/22 235 lb (106.6 kg)  07/08/22 236 lb 1.6 oz (107.1 kg)     GEN: Morbidly obese, 81  y.o. female in no acute distress, wearing chronic O2 HEENT: Normal NECK: No JVD; No carotid bruits CARDIAC: S1/S2, RRR, no murmurs, rubs, gallops; 2+ pulses RESPIRATORY:  Diminished crackles to bilateral posterior lung bases, overall diminished, without wheezing or rhonchi  MUSCULOSKELETAL:  Nonpitting edema (R>L); No deformity  SKIN: Warm and dry NEUROLOGIC:  Alert and oriented x 3 PSYCHIATRIC:  Normal affect   ASSESSMENT:    1. Acute on chronic diastolic heart failure ([place])   2. Coronary artery disease involving native heart without angina pectoris, unspecified vessel or lesion type   3. Persistent atrial fibrillation ([place])   4. S/P aortic valve replacement with bioprosthetic valve   5. Essential hypertension, benign   6. Hyperlipidemia, unspecified hyperlipidemia type   7. Chronic obstructive pulmonary disease, unspecified COPD type ([place])   8. Lung crackles   9. Dyspnea on exertion   10. CKD (chronic kidney disease) stage 4, GFR 15-29 ml/min (HCC)    PLAN:    In order of problems listed above:  Acute on chronic HFpEF Weight has been trending up over past few days. Admits to leg swelling, R>L lower extremity chronically. Lower lobe crackles to posterior lung fields on exam. Appears volume up. Case d/w DOD (Dr. Dellia Cloud) who agreed with recommendation to increase Torsemide to 80 mg BID x 3 days, then return to normal, take 40 mEq of potassium for 3 days, then return to normal and will repeat BMET in 1 week. Will arrange 2 view CXR. Continue Jardiance. GDMT limited with current kidney disease. Not on BB d/t hx of bradycardia. Low sodium diet, fluid restriction <2L, and daily weights encouraged. Educated to contact our office for weight gain of 2 lbs overnight or 5 lbs in one week. Weight log, BP log, and salty six info given. Heart healthy diet and regular cardiovascular exercise as tolerated  encouraged.   CAD, s/p CABG in 2009 Stable with no anginal symptoms. No indication for ischemic evaluation. Continue atorvastatin and fenofibrate. Not on Aspirin due to being on Eliquis and not on BB d/t past bradycardia. Heart healthy diet and regular cardiovascular exercise as tolerated encouraged.   Persistent A-fib Denies any tachycardia or palpitations. Metoprolol was stopped during past hospitalization due to patient being bradycardic with heart rate in 30s. HR today stable. Cardiac monitor results are pending. Continue Eliquis 2.5 mg BID, on appropriate dosage. Heart healthy diet and regular cardiovascular exercise as tolerated encouraged.   AS, s/p bioprosthetic AVR Echo 06/2022 revealed 21 mm magna pericardial bioprosthetic aortic valve with trivial regurgitation noted.  Continue current medication regimen. SBE prophylaxis discussed. Heart healthy diet and regular cardiovascular exercise as tolerated encouraged.   HTN Blood pressure on arrival, 142/58, repeat BP 137/62. Discussed to monitor BP at home at least 2 hours after medications and sitting for 5-10 minutes. Given BP log. Will refill Amlodipine per patient's request. Continue current medication regimen. Will obtain BMET as mentioned above. Recommended OMRON cuff. Low salt, heart healthy diet recommended.   HLD Recent lipid panel revealed LDL 85. Continue atorvastatin and fenofibrate. Heart healthy diet and regular cardiovascular exercise as tolerated encouraged.   COPD, lung crackles, DOE Pt admits to DOE and lung crackles noted on exam. Increasing Torsemide and Potassium as mentioned above. Continue current medication regimen. Arranging 2 view CXR. Continue to f/u with Pulmonology and PCP.   CKD stage IV Recent sCr was 1.78 with eGFR at 28. She is not following Nephrologist and I recommended a Nephrology referral based on current guidelines, and patient  is agreeable. Will refer to Nephrology. Avoid nephrotoxic agents. Obtaining  repeat BMET as mentioned above.   Disposition: Follow-up with me or APP in 1 week or sooner if anything changes.    Medication Adjustments/Labs and Tests Ordered: Current medicines are reviewed at length with the patient today.  Concerns regarding medicines are outlined above.  Orders Placed This Encounter  Procedures   DG Chest 2 View   Basic metabolic panel   Ambulatory referral to Nephrology   Meds ordered this encounter  Medications   amLODipine (NORVASC) 5 MG tablet    Sig: Take 1 tablet (5 mg total) by mouth daily.    Dispense:  90 tablet    Refill:  3    Patient Instructions  Medication Instructions:  Your physician has recommended you make the following change in your medication:  -Increase Torsemide to 80 mg tablets twice daily for 3 days.  -Increase Potasium to 40 mEq for 3 days.    Labwork: In 1 week: - BMET  Testing/Procedures: A chest x-ray takes a picture of the organs and structures inside the chest, including the heart, lungs, and blood vessels. This test can show several things, including, whether the heart is enlarges; whether fluid is building up in the lungs; and whether pacemaker / defibrillator leads are still in place.   Follow-Up: Follow up with Finis Bud, NP in 1 week.   Any Other Special Instructions Will Be Listed Below (If Applicable).   You have been referred to Nephrology. They will contact you with your first appointment information.   Omron BP cuff.  If you need a refill on your cardiac medications before your next appointment, please call your pharmacy.  Your physician has requested that you regularly monitor and record your blood pressure readings at home. Please use the same machine at the same time of day to check your readings and record them to bring to your follow-up visit.  Your physician recommends that you weigh, daily, at the same time every day, and in the same amount of clothing. Please record your daily weights on  the handout provided and bring it to your next appointment.      SignedFinis Bud, NP  07/31/2022 11:59 AM    [place]

## 2022-07-29 NOTE — Patient Instructions (Signed)
Medication Instructions:  Your physician has recommended you make the following change in your medication:  -Increase Torsemide to 80 mg tablets twice daily for 3 days.  -Increase Potasium to 40 mEq for 3 days.    Labwork: In 1 week: - BMET  Testing/Procedures: A chest x-ray takes a picture of the organs and structures inside the chest, including the heart, lungs, and blood vessels. This test can show several things, including, whether the heart is enlarges; whether fluid is building up in the lungs; and whether pacemaker / defibrillator leads are still in place.   Follow-Up: Follow up with Finis Bud, NP in 1 week.   Any Other Special Instructions Will Be Listed Below (If Applicable).   You have been referred to Nephrology. They will contact you with your first appointment information.   Omron BP cuff.  If you need a refill on your cardiac medications before your next appointment, please call your pharmacy.  Your physician has requested that you regularly monitor and record your blood pressure readings at home. Please use the same machine at the same time of day to check your readings and record them to bring to your follow-up visit.  Your physician recommends that you weigh, daily, at the same time every day, and in the same amount of clothing. Please record your daily weights on the handout provided and bring it to your next appointment.

## 2022-07-30 ENCOUNTER — Other Ambulatory Visit (HOSPITAL_COMMUNITY)
Admission: RE | Admit: 2022-07-30 | Discharge: 2022-07-30 | Disposition: A | Discharge status: Home / Self Care | Payer: Medicare PPO | Source: Ambulatory Visit | Attending: Nurse Practitioner | Admitting: Nurse Practitioner

## 2022-07-30 ENCOUNTER — Other Ambulatory Visit: Payer: Self-pay

## 2022-07-30 ENCOUNTER — Ambulatory Visit (HOSPITAL_COMMUNITY)
Admission: RE | Admit: 2022-07-30 | Discharge: 2022-07-30 | Disposition: A | Discharge status: Home / Self Care | Payer: Medicare PPO | Source: Ambulatory Visit | Attending: Nurse Practitioner | Admitting: Nurse Practitioner

## 2022-07-30 DIAGNOSIS — I480 Paroxysmal atrial fibrillation: Secondary | ICD-10-CM | POA: Diagnosis not present

## 2022-07-30 DIAGNOSIS — K219 Gastro-esophageal reflux disease without esophagitis: Secondary | ICD-10-CM | POA: Diagnosis not present

## 2022-07-30 DIAGNOSIS — I2510 Atherosclerotic heart disease of native coronary artery without angina pectoris: Secondary | ICD-10-CM | POA: Diagnosis not present

## 2022-07-30 DIAGNOSIS — N1832 Chronic kidney disease, stage 3b: Secondary | ICD-10-CM | POA: Diagnosis not present

## 2022-07-30 DIAGNOSIS — I5033 Acute on chronic diastolic (congestive) heart failure: Secondary | ICD-10-CM | POA: Diagnosis not present

## 2022-07-30 DIAGNOSIS — R0989 Other specified symptoms and signs involving the circulatory and respiratory systems: Secondary | ICD-10-CM

## 2022-07-30 DIAGNOSIS — I130 Hypertensive heart and chronic kidney disease with heart failure and stage 1 through stage 4 chronic kidney disease, or unspecified chronic kidney disease: Secondary | ICD-10-CM | POA: Diagnosis not present

## 2022-07-30 DIAGNOSIS — I4510 Unspecified right bundle-branch block: Secondary | ICD-10-CM | POA: Diagnosis not present

## 2022-07-30 DIAGNOSIS — E782 Mixed hyperlipidemia: Secondary | ICD-10-CM | POA: Diagnosis not present

## 2022-07-30 DIAGNOSIS — E890 Postprocedural hypothyroidism: Secondary | ICD-10-CM | POA: Diagnosis not present

## 2022-07-30 LAB — BASIC METABOLIC PANEL
Anion gap: 13 (ref 5–15)
BUN: 35 mg/dL — ABNORMAL HIGH (ref 8–23)
CO2: 36 mmol/L — ABNORMAL HIGH (ref 22–32)
Calcium: 9.2 mg/dL (ref 8.9–10.3)
Chloride: 91 mmol/L — ABNORMAL LOW (ref 98–111)
Creatinine, Ser: 1.68 mg/dL — ABNORMAL HIGH (ref 0.44–1.00)
GFR, Estimated: 30 mL/min — ABNORMAL LOW (ref >60)
Glucose, Bld: 145 mg/dL — ABNORMAL HIGH (ref 70–99)
Potassium: 3.8 mmol/L (ref 3.5–5.1)
Sodium: 140 mmol/L (ref 135–145)

## 2022-07-30 MED ORDER — POTASSIUM CHLORIDE CRYS ER 20 MEQ PO TBCR: EXTENDED_RELEASE_TABLET | ORAL | 0 refills | Status: DC

## 2022-07-30 MED ORDER — TORSEMIDE 20 MG PO TABS: ORAL_TABLET | ORAL | 0 refills | Status: DC

## 2022-07-30 NOTE — Telephone Encounter (Signed)
Spoke to pt who stated that she gets medication bubble packed. Sent in prescriptions for torsemide and k. Pt verbalized understanding

## 2022-07-31 ENCOUNTER — Telehealth: Payer: Self-pay | Admitting: Cardiology

## 2022-07-31 ENCOUNTER — Encounter: Payer: Self-pay | Admitting: Nurse Practitioner

## 2022-07-31 DIAGNOSIS — R001 Bradycardia, unspecified: Secondary | ICD-10-CM | POA: Diagnosis not present

## 2022-07-31 NOTE — Telephone Encounter (Signed)
Calling with abnormal results. Please advise  

## 2022-08-01 ENCOUNTER — Telehealth: Payer: Self-pay | Admitting: Cardiology

## 2022-08-01 NOTE — Telephone Encounter (Signed)
[name] by Theodore Demark is calling to give abnormal results.

## 2022-08-01 NOTE — Telephone Encounter (Signed)
Called to report symptomatic bradycardia on page 28 of report Advised that report has already been resulted and patient contacted

## 2022-08-05 ENCOUNTER — Encounter: Payer: Self-pay | Admitting: Nurse Practitioner

## 2022-08-05 ENCOUNTER — Ambulatory Visit: Payer: Medicare PPO | Attending: Nurse Practitioner | Admitting: Nurse Practitioner

## 2022-08-05 VITALS — BP 140/60 | HR 76 | Ht 63.0 in | Wt 235.6 lb

## 2022-08-05 DIAGNOSIS — R0609 Other forms of dyspnea: Secondary | ICD-10-CM

## 2022-08-05 DIAGNOSIS — Z953 Presence of xenogenic heart valve: Secondary | ICD-10-CM | POA: Diagnosis not present

## 2022-08-05 DIAGNOSIS — I5033 Acute on chronic diastolic (congestive) heart failure: Secondary | ICD-10-CM | POA: Diagnosis not present

## 2022-08-05 DIAGNOSIS — R001 Bradycardia, unspecified: Secondary | ICD-10-CM

## 2022-08-05 DIAGNOSIS — I4819 Other persistent atrial fibrillation: Secondary | ICD-10-CM

## 2022-08-05 DIAGNOSIS — I5032 Chronic diastolic (congestive) heart failure: Secondary | ICD-10-CM

## 2022-08-05 DIAGNOSIS — I2510 Atherosclerotic heart disease of native coronary artery without angina pectoris: Secondary | ICD-10-CM | POA: Diagnosis not present

## 2022-08-05 DIAGNOSIS — Z79899 Other long term (current) drug therapy: Secondary | ICD-10-CM

## 2022-08-05 DIAGNOSIS — E785 Hyperlipidemia, unspecified: Secondary | ICD-10-CM

## 2022-08-05 DIAGNOSIS — J449 Chronic obstructive pulmonary disease, unspecified: Secondary | ICD-10-CM

## 2022-08-05 DIAGNOSIS — I10 Essential (primary) hypertension: Secondary | ICD-10-CM

## 2022-08-05 DIAGNOSIS — N184 Chronic kidney disease, stage 4 (severe): Secondary | ICD-10-CM

## 2022-08-05 DIAGNOSIS — I499 Cardiac arrhythmia, unspecified: Secondary | ICD-10-CM | POA: Diagnosis not present

## 2022-08-05 DIAGNOSIS — R0989 Other specified symptoms and signs involving the circulatory and respiratory systems: Secondary | ICD-10-CM

## 2022-08-05 NOTE — Patient Instructions (Signed)
Medication Instructions:  Continue all current medications.   Labwork: BNP, BMET - orders given today  Office will contact with results via phone, letter or mychart.     Testing/Procedures: none  Follow-Up: 1 month    Any Other Special Instructions Will Be Listed Below (If Applicable).   If you need a refill on your cardiac medications before your next appointment, please call your pharmacy.

## 2022-08-05 NOTE — Progress Notes (Unsigned)
Cardiology Office Note:    Date: 08/05/2022  ID:  [name], DOB 1941-03-25, MRN [id number]  PCP:  Glenda Chroman, MD   [facility] Cardiologist:  Rozann Lesches, MD     Referring MD: Glenda Chroman, MD   CC: Here for follow-up  History of Present Illness:    [name] is a 82 y.o. female with a hx of the following:  CAD HFpEF Persistent A-fib Hypertension Hyperlipidemia COPD AS, s/p bioprosthetic aortic valve replacement  Patient is a very pleasant 82 year old female with past medical history as mentioned above.  Has a remote history of CABG x 2 with SVG-RCA and LIMA-LAD as well as AVR in 2009.  NST in 2019 was negative for ischemia.  Echocardiogram in June 2023 showed normal EF, with dynamic LVOT and moderate symmetrical LVH.  Aortic valve was stable with normal function and mild stenosis/regurgitation.  Last seen by Dr. Domenic Polite on April 18, 2022.  She was doing well at that time.  Was still using supplemental oxygen.  No medication changes were made.  Was told to follow-up in 6 months.  She presented to [facility] in January with shortness of breath despite home use of O2 at 2 to 3 L and had associated chest pressure.  EKG was negative for acute ischemic changes.  Troponins were elevated with peak at 2,302.  Chest x-ray revealed cardiomegaly with mild pulmonary vascular congestion mild by basilar and mid right lung atelectasis.  She has been noting increasing DOE over the past month.  She was transferred to [place] due to concern for NSTEMI.  Diuresed in the ED.  Echocardiogram revealed EF 65 to 70%, grade 2 DD, stable aortic valve.  Patient stayed in ER at [facility] for 4 days, she was diuresed.  Volume status improved.  Therapy recommended home health PT/OT.  Creatinine was noted to be 1.9 at discharge and would hold ARB on discharge and would resume as renal function tolerates.  It was recommended to recheck BMET in 1 week.  Metoprolol was  stopped due to patient being bradycardic with heart rate in 30s recently.  Was recommended to follow-up with outpatient cardiology.  07/29/2022 - Saw her for hospital follow-up. Wt is up 5.5 lbs in 2 days. DBP has been running low. Patient was feeling fine, sometimes has shaking, sporadic and has been ongoing for a little over a month. Dyspnea on exertion. Case d/w DOD (Dr. Dellia Cloud) who agreed with recommendation and pt instructed to increase Torsemide to 80 mg BID x 3 days, then return to normal, take 40 mEq of potassium for 3 days, then return to normal. CXR was normal.   08/05/2022 - Here for follow-up. Pt says she is doing the same as last visit. Weight is down 2 lbs since last visit. Denies any chest pain, worsening shortness of breath, palpitations, syncope, presyncope, dizziness, orthopnea, PND, swelling or significant weight changes, acute bleeding, or claudication. Tolerating medications well.   Past Medical History:  Diagnosis Date   Aortic stenosis    Arthritis    Atrial fibrillation ([place])    Coronary atherosclerosis of native coronary artery    Multivessel   Essential hypertension    Gout    Hypothyroidism    Mixed hyperlipidemia    Psoriasis     Past Surgical History:  Procedure Laterality Date   AORTIC VALVE REPLACEMENT  2009   21 mm Edwards Magna pericardial valve   CARDIOVERSION N/A 10/20/2020   Procedure:  CARDIOVERSION;  Surgeon: Satira Sark, MD;  Location: [facility];  Service: Cardiovascular;  Laterality: N/A;   CHOLECYSTECTOMY     2011   CORONARY ARTERY BYPASS GRAFT  2009   LIMA to LAD, SVG to RCA   JOINT REPLACEMENT  2012   L TOTAL KNEE   TOTAL KNEE ARTHROPLASTY  09/16/2011   Procedure: TOTAL KNEE ARTHROPLASTY;  Surgeon: Gearlean Alf, MD;  Location: [facility];  Service: Orthopedics;  Laterality: Right;   TOTAL THYROIDECTOMY  1970    Current Medications: Current Meds  Medication Sig   acetaminophen (TYLENOL) 650 MG CR tablet Take 650 mg by mouth every 8  (eight) hours as needed for pain.   allopurinol (ZYLOPRIM) 300 MG tablet Take 300 mg by mouth daily.   atorvastatin (LIPITOR) 20 MG tablet Take 20 mg by mouth daily.   docusate sodium (COLACE) 100 MG capsule Take 100 mg by mouth at bedtime.   ELIQUIS 2.5 MG TABS tablet TAKE 1 TABLET BY MOUTH TWICE DAILY   empagliflozin (JARDIANCE) 10 MG TABS tablet Take 1 tablet (10 mg total) by mouth daily before breakfast.   fenofibrate (TRICOR) 145 MG tablet Take 145 mg by mouth daily.   ferrous sulfate 325 (65 FE) MG EC tablet Take 325 mg by mouth at bedtime.   levothyroxine (SYNTHROID, LEVOTHROID) 125 MCG tablet Take 125 mcg by mouth daily before breakfast.   Multiple Vitamins-Minerals (CENTRUM SILVER PO) Take 1 tablet by mouth at bedtime.   pantoprazole (PROTONIX) 40 MG tablet Take 40 mg by mouth daily.   potassium chloride SA (KLOR-CON M) 20 MEQ tablet Take 1 tablet (20 mEq total) by mouth daily.   torsemide (DEMADEX) 20 MG tablet Take 3 tablets (60 mg total) by mouth 2 (two) times daily. May take 1 extra tablet as needed. No more than 2 extra in 1 week.   Vitamin D, Ergocalciferol, (DRISDOL) 50000 UNITS CAPS Take 50,000 Units by mouth every Sunday.   amLODipine (NORVASC) 5 MG tablet Take 1 tablet (5 mg total) by mouth daily.     Allergies:   Statins and Penicillins   Social History   Socioeconomic History   Marital status: Widowed    Spouse name: Not on file   Number of children: Not on file   Years of education: Not on file   Highest education level: High school graduate  Occupational History   Occupation: Retired  Tobacco Use   Smoking status: Former    Packs/day: 0.50    Years: 15.00    Total pack years: 7.50    Types: Cigarettes    Quit date: 06/27/1989    Years since quitting: 33.1    Passive exposure: Never   Smokeless tobacco: Never  Vaping Use   Vaping Use: Never used  Substance and Sexual Activity   Alcohol use: No    Alcohol/week: 0.0 standard drinks of alcohol   Drug use:  No   Sexual activity: Not on file  Other Topics Concern   Not on file  Social History Narrative   Not on file   Social Determinants of Health   Financial Resource Strain: Low Risk  (07/01/2022)   Overall Financial Resource Strain (CARDIA)    Difficulty of Paying Living Expenses: Not very hard  Food Insecurity: No Food Insecurity (06/30/2022)   Hunger Vital Sign    Worried About Running Out of Food in the Last Year: Never true    [place] in the Last Year: Never true  Transportation Needs: No Transportation Needs (06/30/2022)   PRAPARE - Hydrologist (Medical): No    Lack of Transportation (Non-Medical): No  Physical Activity: Not on file  Stress: Not on file  Social Connections: Not on file     Family History: The patient's family history includes Aneurysm in her father; Multiple sclerosis in her sister.  ROS:   Review of Systems  Constitutional: Negative.   HENT: Negative.    Eyes: Negative.   Respiratory:  Positive for shortness of breath. Negative for cough, hemoptysis, sputum production and wheezing.   Cardiovascular:  Positive for leg swelling. Negative for chest pain, palpitations, orthopnea, claudication and PND.  Gastrointestinal: Negative.   Genitourinary: Negative.   Musculoskeletal: Negative.   Skin: Negative.   Neurological: Negative.   Endo/Heme/Allergies: Negative.   Psychiatric/Behavioral: Negative.      Please see the history of present illness.    All other systems reviewed and are negative.  EKGs/Labs/Other Studies Reviewed:    The following studies were reviewed today:   EKG:  EKG is not ordered today.    Cardiac monitor on 07/31/2022: Predominant rhythm is sinus with IVCD.  Heart rate ranged from 33 bpm up to 123 bpm and average heart rate 64 bpm. Occasional PACs were noted representing 1.9% total beats with otherwise rare atrial couplets and triplets. Occasional PVCs representing 3.4% total beats were noted with  otherwise rare ventricular couplets and triplets.  Also limited episodes of ventricular bigeminy and trigeminy. There were two brief episodes of NSVT noted, the longest of which lasted 6 beats. There were five episodes of PSVT noted, the longest of which lasted 20 beats. There were two pauses noted, the longest of which lasted 3.8 seconds (2:03 AM on February 1).  The second pause was 3 seconds in duration (2:53 PM on January 20).  Neither of these were patient triggered events.  Also intermittently noted junctional rhythm and possible idioventricular rhythm observed in the setting of bradycardia.   Limited Echo 07/05/2022:  1. Left ventricular ejection fraction, by estimation, is 65 to 70%. The  left ventricle has normal function. The left ventricle has no regional  wall motion abnormalities. There is moderate concentric left ventricular  hypertrophy. Indeterminate diastolic  filling due to E-A fusion.   2. Right ventricular systolic function was not well visualized. The right  ventricular size is not well visualized. There is normal pulmonary artery  systolic pressure. The estimated right ventricular systolic pressure is  [id number] mmHg.   3. The mitral valve is degenerative. Trivial mitral valve regurgitation.  No evidence of mitral stenosis.   4. 21 mm Magna pericardial bioprosthetic aortic valve. Vmax 2.5 m/s, MG  14 mmHg, EOA 1.96 cm2, DI 0.62. Trivial regurgitation noted. The aortic  valve has been repaired/replaced. Aortic valve regurgitation is trivial.  Procedure Date: 12/10/2007.   5. The inferior vena cava is normal in size with greater than 50%  respiratory variability, suggesting right atrial pressure of 3 mmHg.   Comparison(s): No significant change from prior study.   Lexiscan on 12/23/2017: There was no ST segment deviation noted during stress. Sinus rhythm with right bundle branch block and frequent PACs seen throughout study. Defect 1: There is a defect present in the mid  anterior and apical anterior location. Images appear to be improved with stress consistent with reversed redistribution. There are no significant ischemic territories. This is a low risk study. Nuclear stress EF: 48%.  Recent Labs: 07/06/2022: Hemoglobin 10.4; Platelets  178 07/07/2022: Magnesium 2.3 07/08/2022: ALT 10 08/05/2022: BNP 36.1; BUN 40; Creatinine, Ser 1.82; Potassium 4.6; Sodium 142  Recent Lipid Panel    Component Value Date/Time   CHOL 164 07/02/2022 0058   TRIG 232 (H) 07/02/2022 0058   HDL 33 (L) 07/02/2022 0058   CHOLHDL 5.0 07/02/2022 0058   VLDL 46 (H) 07/02/2022 0058   LDLCALC 85 07/02/2022 0058     Risk Assessment/Calculations:    CHA2DS2-VASc Score = 6  This indicates a 9.7% annual risk of stroke. The patient's score is based upon: CHF History: 1 HTN History: 1 Diabetes History: 0 Stroke History: 0 Vascular Disease History: 1 Age Score: 2 Gender Score: 1   Physical Exam:    VS:  BP (!) 140/60 (BP Location: Left Arm, Patient Position: Sitting, Cuff Size: Normal)   Pulse 76   Ht 5' 3"$  (1.6 m)   Wt 235 lb 9.6 oz (106.9 kg)   LMP  (LMP Unknown)   SpO2 93%   BMI 41.73 kg/m     Wt Readings from Last 3 Encounters:  08/05/22 235 lb 9.6 oz (106.9 kg)  07/29/22 237 lb (107.5 kg)  07/22/22 235 lb (106.6 kg)     GEN: Morbidly obese, 82 y.o. female in no acute distress, wearing chronic O2 HEENT: Normal NECK: No JVD; No carotid bruits CARDIAC: S1/S2, RRR, Grade 2/6 murmur, no rubs, no gallops; 2+ pulses RESPIRATORY:  Diminished crackles to bilateral posterior lung bases, overall diminished, without wheezing or rhonchi  MUSCULOSKELETAL:  Nonpitting edema (R>L); No deformity  SKIN: Warm and dry NEUROLOGIC:  Alert and oriented x 3 PSYCHIATRIC:  Normal affect   ASSESSMENT:    1. Chronic heart failure with preserved ejection fraction (HFpEF) ([place])   2. Medication management   3. Coronary artery disease involving native heart without angina pectoris,  unspecified vessel or lesion type   4. Persistent atrial fibrillation ([place])   5. Bradycardia   6. Cardiac arrhythmia, unspecified cardiac arrhythmia type   7. S/P aortic valve replacement with bioprosthetic valve   8. Essential hypertension, benign   9. Hyperlipidemia, unspecified hyperlipidemia type   10. Chronic obstructive pulmonary disease, unspecified COPD type ([place])   11. Dyspnea on exertion   12. Lung crackles   13. CKD (chronic kidney disease) stage 4, GFR 15-29 ml/min (H[facility])    PLAN:    In order of problems listed above:  Chronic HFpEF Weight is down 2 lbs since last visit. Admits to leg swelling, R>L lower extremity chronically.  Continue Jardiance, Torsemide, and potassium supplementation. GDMT limited with current kidney disease. Not on BB d/t hx of bradycardia. Low sodium diet, fluid restriction <2L, and daily weights encouraged. Educated to contact our office for weight gain of 2 lbs overnight or 5 lbs in one week. Weight log, BP log, and salty six info given. Heart healthy diet and regular cardiovascular exercise as tolerated encouraged. Will obtain BMET and proBNP after recent medication changes at last OV.   CAD, s/p CABG in 2009 Stable with no anginal symptoms. No indication for ischemic evaluation. Continue atorvastatin and fenofibrate. Not on Aspirin due to being on Eliquis and not on BB d/t past bradycardia. Heart healthy diet and regular cardiovascular exercise as tolerated encouraged.   Persistent A-fib, hx of bradycardia/pauses/arrhythmia Denies any tachycardia or palpitations. Metoprolol was stopped during past hospitalization due to patient being bradycardic with heart rate in 30s. HR today stable. Cardiac monitor results as mentioned above. Findings revealed two episodes of pauses, longest lasting  3.8 seconds. Result note by Vikki Ports, PA-C shows pt was updated and aware of monitor result and asymptomatic at the time. Continues to be asymptomatic. I offered a  EP referral; however, pt would like to watch and monitor at this time, declines EP referral. Continue Eliquis 2.5 mg BID, on appropriate dosage. Heart healthy diet and regular cardiovascular exercise as tolerated encouraged.   AS, s/p bioprosthetic AVR Echo 06/2022 revealed 21 mm magna pericardial bioprosthetic aortic valve with trivial regurgitation noted.  Continue current medication regimen. SBE prophylaxis discussed. Heart healthy diet and regular cardiovascular exercise as tolerated encouraged.   HTN Blood pressure elevated today. Discussed to monitor BP at home at least 2 hours after medications and sitting for 5-10 minutes. Given BP log. Continue current medication regimen.  Recommended OMRON cuff. Low salt, heart healthy diet recommended. Consider increasing Amlodipine at next OV.   HLD Recent lipid panel revealed LDL 85. Continue atorvastatin and fenofibrate. Heart healthy diet and regular cardiovascular exercise as tolerated encouraged.   COPD, lung crackles, DOE Pt admits to DOE, stable. Recent CXR normal. Continue current medication regimen. Continue to f/u with Pulmonology and PCP. Continue oxygen therapy.   CKD stage IV Recent sCr was 1.78 with eGFR at 28. At last visit, I placed a referral to Nephrology. Avoid nephrotoxic agents.   Disposition: Follow-up with me or APP in 1 month or sooner if anything changes.    Medication Adjustments/Labs and Tests Ordered: Current medicines are reviewed at length with the patient today.  Concerns regarding medicines are outlined above.  Orders Placed This Encounter  Procedures   Basic metabolic panel   Brain natriuretic peptide   No orders of the defined types were placed in this encounter.   Patient Instructions  Medication Instructions:  Continue all current medications.   Labwork: BNP, BMET - orders given today  Office will contact with results via phone, letter or mychart.     Testing/Procedures: none  Follow-Up: 1 month     Any Other Special Instructions Will Be Listed Below (If Applicable).   If you need a refill on your cardiac medications before your next appointment, please call your pharmacy.    SignedFinis Bud, NP  08/07/2022 12:23 PM    [place]

## 2022-08-06 DIAGNOSIS — I5020 Unspecified systolic (congestive) heart failure: Secondary | ICD-10-CM | POA: Diagnosis not present

## 2022-08-06 DIAGNOSIS — R0602 Shortness of breath: Secondary | ICD-10-CM | POA: Diagnosis not present

## 2022-08-06 LAB — BASIC METABOLIC PANEL
BUN/Creatinine Ratio: 22 (ref 12–28)
BUN: 40 mg/dL — ABNORMAL HIGH (ref 8–27)
CO2: 31 mmol/L — ABNORMAL HIGH (ref 20–29)
Calcium: 9.3 mg/dL (ref 8.7–10.3)
Chloride: 93 mmol/L — ABNORMAL LOW (ref 96–106)
Creatinine, Ser: 1.82 mg/dL — ABNORMAL HIGH (ref 0.57–1.00)
Glucose: 107 mg/dL — ABNORMAL HIGH (ref 70–99)
Potassium: 4.6 mmol/L (ref 3.5–5.2)
Sodium: 142 mmol/L (ref 134–144)
eGFR: 28 mL/min/{1.73_m2} — ABNORMAL LOW (ref >59)

## 2022-08-06 LAB — BRAIN NATRIURETIC PEPTIDE: BNP: 36.1 pg/mL (ref 0.0–100.0)

## 2022-08-07 ENCOUNTER — Encounter: Payer: Self-pay | Admitting: Nurse Practitioner

## 2022-08-07 DIAGNOSIS — E782 Mixed hyperlipidemia: Secondary | ICD-10-CM | POA: Diagnosis not present

## 2022-08-07 DIAGNOSIS — N1832 Chronic kidney disease, stage 3b: Secondary | ICD-10-CM | POA: Diagnosis not present

## 2022-08-07 DIAGNOSIS — I480 Paroxysmal atrial fibrillation: Secondary | ICD-10-CM | POA: Diagnosis not present

## 2022-08-07 DIAGNOSIS — I130 Hypertensive heart and chronic kidney disease with heart failure and stage 1 through stage 4 chronic kidney disease, or unspecified chronic kidney disease: Secondary | ICD-10-CM | POA: Diagnosis not present

## 2022-08-07 DIAGNOSIS — I4510 Unspecified right bundle-branch block: Secondary | ICD-10-CM | POA: Diagnosis not present

## 2022-08-07 DIAGNOSIS — E890 Postprocedural hypothyroidism: Secondary | ICD-10-CM | POA: Diagnosis not present

## 2022-08-07 DIAGNOSIS — K219 Gastro-esophageal reflux disease without esophagitis: Secondary | ICD-10-CM | POA: Diagnosis not present

## 2022-08-07 DIAGNOSIS — I2510 Atherosclerotic heart disease of native coronary artery without angina pectoris: Secondary | ICD-10-CM | POA: Diagnosis not present

## 2022-08-07 DIAGNOSIS — I5033 Acute on chronic diastolic (congestive) heart failure: Secondary | ICD-10-CM | POA: Diagnosis not present

## 2022-08-14 DIAGNOSIS — K219 Gastro-esophageal reflux disease without esophagitis: Secondary | ICD-10-CM | POA: Diagnosis not present

## 2022-08-14 DIAGNOSIS — I2510 Atherosclerotic heart disease of native coronary artery without angina pectoris: Secondary | ICD-10-CM | POA: Diagnosis not present

## 2022-08-14 DIAGNOSIS — I5033 Acute on chronic diastolic (congestive) heart failure: Secondary | ICD-10-CM | POA: Diagnosis not present

## 2022-08-14 DIAGNOSIS — E782 Mixed hyperlipidemia: Secondary | ICD-10-CM | POA: Diagnosis not present

## 2022-08-14 DIAGNOSIS — I130 Hypertensive heart and chronic kidney disease with heart failure and stage 1 through stage 4 chronic kidney disease, or unspecified chronic kidney disease: Secondary | ICD-10-CM | POA: Diagnosis not present

## 2022-08-14 DIAGNOSIS — E890 Postprocedural hypothyroidism: Secondary | ICD-10-CM | POA: Diagnosis not present

## 2022-08-14 DIAGNOSIS — I480 Paroxysmal atrial fibrillation: Secondary | ICD-10-CM | POA: Diagnosis not present

## 2022-08-14 DIAGNOSIS — N1832 Chronic kidney disease, stage 3b: Secondary | ICD-10-CM | POA: Diagnosis not present

## 2022-08-14 DIAGNOSIS — I4510 Unspecified right bundle-branch block: Secondary | ICD-10-CM | POA: Diagnosis not present

## 2022-08-15 DIAGNOSIS — I5020 Unspecified systolic (congestive) heart failure: Secondary | ICD-10-CM | POA: Diagnosis not present

## 2022-08-15 DIAGNOSIS — R0602 Shortness of breath: Secondary | ICD-10-CM | POA: Diagnosis not present

## 2022-08-19 ENCOUNTER — Telehealth: Payer: Self-pay | Admitting: *Deleted

## 2022-08-19 NOTE — Telephone Encounter (Signed)
Laurine Blazer, LPN [id number] AM EST Back to Top    Notified, copy to pcp.   Laurine Blazer, LPN [id number]  [id number] PM EST     Left message to return call.   Finis Bud, NP 08/09/2022  8:24 AM EST     Please let patient know that her labs are overall stable.  BNP is normal.  Her heart failure is well-controlled.  Her dyspnea on exertion is due to COPD.  I will see her back as scheduled.   Thanks!   Best, Finis Bud, AGNP-C

## 2022-08-22 DIAGNOSIS — I5033 Acute on chronic diastolic (congestive) heart failure: Secondary | ICD-10-CM | POA: Diagnosis not present

## 2022-08-22 DIAGNOSIS — E782 Mixed hyperlipidemia: Secondary | ICD-10-CM | POA: Diagnosis not present

## 2022-08-22 DIAGNOSIS — I480 Paroxysmal atrial fibrillation: Secondary | ICD-10-CM | POA: Diagnosis not present

## 2022-08-22 DIAGNOSIS — I2510 Atherosclerotic heart disease of native coronary artery without angina pectoris: Secondary | ICD-10-CM | POA: Diagnosis not present

## 2022-08-22 DIAGNOSIS — I4510 Unspecified right bundle-branch block: Secondary | ICD-10-CM | POA: Diagnosis not present

## 2022-08-22 DIAGNOSIS — E890 Postprocedural hypothyroidism: Secondary | ICD-10-CM | POA: Diagnosis not present

## 2022-08-22 DIAGNOSIS — N1832 Chronic kidney disease, stage 3b: Secondary | ICD-10-CM | POA: Diagnosis not present

## 2022-08-22 DIAGNOSIS — I130 Hypertensive heart and chronic kidney disease with heart failure and stage 1 through stage 4 chronic kidney disease, or unspecified chronic kidney disease: Secondary | ICD-10-CM | POA: Diagnosis not present

## 2022-08-22 DIAGNOSIS — K219 Gastro-esophageal reflux disease without esophagitis: Secondary | ICD-10-CM | POA: Diagnosis not present

## 2022-08-28 DIAGNOSIS — I480 Paroxysmal atrial fibrillation: Secondary | ICD-10-CM | POA: Diagnosis not present

## 2022-08-28 DIAGNOSIS — I5033 Acute on chronic diastolic (congestive) heart failure: Secondary | ICD-10-CM | POA: Diagnosis not present

## 2022-08-28 DIAGNOSIS — K219 Gastro-esophageal reflux disease without esophagitis: Secondary | ICD-10-CM | POA: Diagnosis not present

## 2022-08-28 DIAGNOSIS — N1832 Chronic kidney disease, stage 3b: Secondary | ICD-10-CM | POA: Diagnosis not present

## 2022-08-28 DIAGNOSIS — E782 Mixed hyperlipidemia: Secondary | ICD-10-CM | POA: Diagnosis not present

## 2022-08-28 DIAGNOSIS — I4510 Unspecified right bundle-branch block: Secondary | ICD-10-CM | POA: Diagnosis not present

## 2022-08-28 DIAGNOSIS — E890 Postprocedural hypothyroidism: Secondary | ICD-10-CM | POA: Diagnosis not present

## 2022-08-28 DIAGNOSIS — I2510 Atherosclerotic heart disease of native coronary artery without angina pectoris: Secondary | ICD-10-CM | POA: Diagnosis not present

## 2022-08-28 DIAGNOSIS — I130 Hypertensive heart and chronic kidney disease with heart failure and stage 1 through stage 4 chronic kidney disease, or unspecified chronic kidney disease: Secondary | ICD-10-CM | POA: Diagnosis not present

## 2022-08-30 DIAGNOSIS — I4819 Other persistent atrial fibrillation: Secondary | ICD-10-CM | POA: Diagnosis not present

## 2022-08-30 DIAGNOSIS — I359 Nonrheumatic aortic valve disorder, unspecified: Secondary | ICD-10-CM | POA: Diagnosis not present

## 2022-08-30 DIAGNOSIS — I129 Hypertensive chronic kidney disease with stage 1 through stage 4 chronic kidney disease, or unspecified chronic kidney disease: Secondary | ICD-10-CM | POA: Diagnosis not present

## 2022-08-30 DIAGNOSIS — E873 Alkalosis: Secondary | ICD-10-CM | POA: Diagnosis not present

## 2022-08-30 DIAGNOSIS — I5033 Acute on chronic diastolic (congestive) heart failure: Secondary | ICD-10-CM | POA: Diagnosis not present

## 2022-08-30 DIAGNOSIS — Z5181 Encounter for therapeutic drug level monitoring: Secondary | ICD-10-CM | POA: Diagnosis not present

## 2022-08-30 DIAGNOSIS — D638 Anemia in other chronic diseases classified elsewhere: Secondary | ICD-10-CM | POA: Diagnosis not present

## 2022-09-03 ENCOUNTER — Encounter: Payer: Self-pay | Admitting: Nurse Practitioner

## 2022-09-03 ENCOUNTER — Ambulatory Visit: Payer: Medicare PPO | Attending: Nurse Practitioner | Admitting: Nurse Practitioner

## 2022-09-03 VITALS — BP 112/60 | HR 78 | Ht 63.0 in | Wt 233.4 lb

## 2022-09-03 DIAGNOSIS — E785 Hyperlipidemia, unspecified: Secondary | ICD-10-CM

## 2022-09-03 DIAGNOSIS — I4819 Other persistent atrial fibrillation: Secondary | ICD-10-CM | POA: Diagnosis not present

## 2022-09-03 DIAGNOSIS — I5032 Chronic diastolic (congestive) heart failure: Secondary | ICD-10-CM | POA: Diagnosis not present

## 2022-09-03 DIAGNOSIS — Z87898 Personal history of other specified conditions: Secondary | ICD-10-CM

## 2022-09-03 DIAGNOSIS — I2510 Atherosclerotic heart disease of native coronary artery without angina pectoris: Secondary | ICD-10-CM

## 2022-09-03 DIAGNOSIS — I10 Essential (primary) hypertension: Secondary | ICD-10-CM | POA: Diagnosis not present

## 2022-09-03 DIAGNOSIS — R0609 Other forms of dyspnea: Secondary | ICD-10-CM | POA: Diagnosis not present

## 2022-09-03 DIAGNOSIS — Z953 Presence of xenogenic heart valve: Secondary | ICD-10-CM

## 2022-09-03 DIAGNOSIS — N184 Chronic kidney disease, stage 4 (severe): Secondary | ICD-10-CM

## 2022-09-03 DIAGNOSIS — J449 Chronic obstructive pulmonary disease, unspecified: Secondary | ICD-10-CM

## 2022-09-03 NOTE — Progress Notes (Signed)
Cardiology Office Note:    Date: 09/03/2022  ID:  [name], DOB Oct 11, 1940, MRN [id number]  PCP:  Glenda Chroman, MD   [facility] Cardiologist:  Rozann Lesches, MD     Referring MD: Glenda Chroman, MD   CC: Here for follow-up  History of Present Illness:    PAULETT [name] is a 82 y.o. female with a hx of the following:  CAD HFpEF Persistent A-fib Hypertension Hyperlipidemia COPD AS, s/p bioprosthetic aortic valve replacement  Has a remote history of CABG x 2 with SVG-RCA and LIMA-LAD as well as AVR in 2009.  NST in 2019 was negative for ischemia.  TTE 11/2021 showed normal EF, with dynamic LVOT and moderate symmetrical LVH.  Aortic valve was stable with normal function and mild stenosis/regurgitation.  Presented to [name] in January with [name] despite home use of O2 at 2 to 3 L, had associated chest pressure.  EKG was negative for acute ischemic changes.  Troponins were elevated with peak at 2,302.  CXR evealed cardiomegaly with mild pulmonary vascular congestion mild bibasilar and mid right lung atelectasis.  Transferred to [facility] due to concern for NSTEMI.  TTE revealed EF 65 to 70%, grade 2 DD, stable aortic valve. Was diuresed.   It was recommended to recheck BMET in 1 week.  Metoprolol was stopped due to patient being bradycardic with HR in 30s .    Today she is doing well.  Denies any chest pain, shortness of breath, palpitations, syncope, presyncope, dizziness, orthopnea, PND, swelling or significant weight changes, acute bleeding, or claudication.  Weight is stable.  Admits to continued DOE. Per our scale, she is down 2 pounds from last visit.  Past Medical History:  Diagnosis Date   Aortic stenosis    Arthritis    Atrial fibrillation ([place])    Coronary atherosclerosis of native coronary artery    Multivessel   Essential hypertension    Gout    Hypothyroidism    Mixed hyperlipidemia    Psoriasis     Past Surgical History:  Procedure  Laterality Date   AORTIC VALVE REPLACEMENT  2009   21 mm Edwards Magna pericardial valve   CARDIOVERSION N/A 10/20/2020   Procedure: CARDIOVERSION;  Surgeon: Satira Sark, MD;  Location: [facility];  Service: Cardiovascular;  Laterality: N/A;   CHOLECYSTECTOMY     2011   CORONARY ARTERY BYPASS GRAFT  2009   LIMA to LAD, SVG to RCA   JOINT REPLACEMENT  2012   L TOTAL KNEE   TOTAL KNEE ARTHROPLASTY  09/16/2011   Procedure: TOTAL KNEE ARTHROPLASTY;  Surgeon: Gearlean Alf, MD;  Location: [facility];  Service: Orthopedics;  Laterality: Right;   TOTAL THYROIDECTOMY  1970   Current Meds  Medication Sig   acetaminophen (TYLENOL) 650 MG CR tablet Take 650 mg by mouth every 8 (eight) hours as needed for pain.   allopurinol (ZYLOPRIM) 300 MG tablet Take 300 mg by mouth daily.   amLODipine (NORVASC) 5 MG tablet Take 1 tablet (5 mg total) by mouth daily.   atorvastatin (LIPITOR) 20 MG tablet Take 20 mg by mouth daily.   docusate sodium (COLACE) 100 MG capsule Take 100 mg by mouth at bedtime.   ELIQUIS 2.5 MG TABS tablet TAKE 1 TABLET BY MOUTH TWICE DAILY   empagliflozin (JARDIANCE) 10 MG TABS tablet Take 1 tablet (10 mg total) by mouth daily before breakfast.   fenofibrate (TRICOR) 145 MG tablet Take 145 mg  by mouth daily.   ferrous sulfate 325 (65 FE) MG EC tablet Take 325 mg by mouth at bedtime.   levothyroxine (SYNTHROID, LEVOTHROID) 125 MCG tablet Take 125 mcg by mouth daily before breakfast.   Multiple Vitamins-Minerals (CENTRUM SILVER PO) Take 1 tablet by mouth at bedtime.   pantoprazole (PROTONIX) 40 MG tablet Take 40 mg by mouth daily.   potassium chloride SA (KLOR-CON M) 20 MEQ tablet Take 1 tablet (20 mEq total) by mouth daily.   torsemide (DEMADEX) 20 MG tablet Take 3 tablets (60 mg total) by mouth 2 (two) times daily. May take 1 extra tablet as needed. No more than 2 extra in 1 week.   torsemide (DEMADEX) 20 MG tablet Take 20 mg tablet in addition to 60 mg for 3 days.   Vitamin D,  Ergocalciferol, (DRISDOL) 50000 UNITS CAPS Take 50,000 Units by mouth every Sunday.    Allergies:   Statins and Penicillins   Social History   Socioeconomic History   Marital status: Widowed    Spouse name: Not on file   Number of children: Not on file   Years of education: Not on file   Highest education level: High school graduate  Occupational History   Occupation: Retired  Tobacco Use   Smoking status: Former    Packs/day: 0.50    Years: 15.00    Total pack years: 7.50    Types: Cigarettes    Quit date: 06/27/1989    Years since quitting: 33.2    Passive exposure: Never   Smokeless tobacco: Never  Vaping Use   Vaping Use: Never used  Substance and Sexual Activity   Alcohol use: No    Alcohol/week: 0.0 standard drinks of alcohol   Drug use: No   Sexual activity: Not on file  Other Topics Concern   Not on file  Social History Narrative   Not on file   Social Determinants of Health   Financial Resource Strain: Low Risk  (07/01/2022)   Overall Financial Resource Strain (CARDIA)    Difficulty of Paying Living Expenses: Not very hard  Food Insecurity: No Food Insecurity (06/30/2022)   Hunger Vital Sign    Worried About Running Out of Food in the Last Year: Never true    [place] in the Last Year: Never true  Transportation Needs: No Transportation Needs (06/30/2022)   PRAPARE - Hydrologist (Medical): No    Lack of Transportation (Non-Medical): No  Physical Activity: Not on file  Stress: Not on file  Social Connections: Not on file     Family History: The patient's family history includes Aneurysm in her father; Multiple sclerosis in her sister.  ROS:     Please see the history of present illness.    All other systems reviewed and are negative.  EKGs/Labs/Other Studies Reviewed:    The following studies were reviewed today:   EKG:  EKG is not ordered today.    Cardiac monitor on 07/31/2022: Predominant rhythm is sinus with  IVCD.  Heart rate ranged from 33 bpm up to 123 bpm and average heart rate 64 bpm. Occasional PACs were noted representing 1.9% total beats with otherwise rare atrial couplets and triplets. Occasional PVCs representing 3.4% total beats were noted with otherwise rare ventricular couplets and triplets.  Also limited episodes of ventricular bigeminy and trigeminy. There were two brief episodes of NSVT noted, the longest of which lasted 6 beats. There were five episodes of PSVT noted,  the longest of which lasted 20 beats. There were two pauses noted, the longest of which lasted 3.8 seconds (2:03 AM on February 1).  The second pause was 3 seconds in duration (2:53 PM on January 20).  Neither of these were patient triggered events.  Also intermittently noted junctional rhythm and possible idioventricular rhythm observed in the setting of bradycardia.   Limited Echo 07/05/2022:  1. Left ventricular ejection fraction, by estimation, is 65 to 70%. The  left ventricle has normal function. The left ventricle has no regional  wall motion abnormalities. There is moderate concentric left ventricular  hypertrophy. Indeterminate diastolic  filling due to E-A fusion.   2. Right ventricular systolic function was not well visualized. The right  ventricular size is not well visualized. There is normal pulmonary artery  systolic pressure. The estimated right ventricular systolic pressure is  [id number] mmHg.   3. The mitral valve is degenerative. Trivial mitral valve regurgitation.  No evidence of mitral stenosis.   4. 21 mm Magna pericardial bioprosthetic aortic valve. Vmax 2.5 m/s, MG  14 mmHg, EOA 1.96 cm2, DI 0.62. Trivial regurgitation noted. The aortic  valve has been repaired/replaced. Aortic valve regurgitation is trivial.  Procedure Date: 12/10/2007.   5. The inferior vena cava is normal in size with greater than 50%  respiratory variability, suggesting right atrial pressure of 3 mmHg.   Comparison(s): No  significant change from prior study.   Lexiscan on 12/23/2017: There was no ST segment deviation noted during stress. Sinus rhythm with right bundle branch block and frequent PACs seen throughout study. Defect 1: There is a defect present in the mid anterior and apical anterior location. Images appear to be improved with stress consistent with reversed redistribution. There are no significant ischemic territories. This is a low risk study. Nuclear stress EF: 48%.  Recent Labs: 07/06/2022: Hemoglobin 10.4; Platelets 178 07/07/2022: Magnesium 2.3 07/08/2022: ALT 10 08/05/2022: BNP 36.1; BUN 40; Creatinine, Ser 1.82; Potassium 4.6; Sodium 142  Recent Lipid Panel    Component Value Date/Time   CHOL 164 07/02/2022 0058   TRIG 232 (H) 07/02/2022 0058   HDL 33 (L) 07/02/2022 0058   CHOLHDL 5.0 07/02/2022 0058   VLDL 46 (H) 07/02/2022 0058   LDLCALC 85 07/02/2022 0058     Risk Assessment/Calculations:    CHA2DS2-VASc Score = 6  This indicates a 9.7% annual risk of stroke. The patient's score is based upon: CHF History: 1 HTN History: 1 Diabetes History: 0 Stroke History: 0 Vascular Disease History: 1 Age Score: 2 Gender Score: 1   Physical Exam:    VS:  BP 112/60 (BP Location: Left Arm, Patient Position: Sitting)   Pulse 78   Ht '5\' 3"'$  (1.6 m)   Wt 233 lb 6.4 oz (105.9 kg)   LMP  (LMP Unknown)   SpO2 98% Comment: on 2L O2 via [place]  BMI 41.34 kg/m     Wt Readings from Last 3 Encounters:  09/03/22 233 lb 6.4 oz (105.9 kg)  08/05/22 235 lb 9.6 oz (106.9 kg)  07/29/22 237 lb (107.5 kg)     GEN: Morbidly obese, 82 y.o. female in no acute distress, wearing chronic O2 HEENT: Normal NECK: No JVD; No carotid bruits CARDIAC: S1/S2, RRR, Grade 2/6 murmur, no rubs, no gallops; 2+ pulses RESPIRATORY:  Diminished crackles to bilateral posterior lung bases, overall diminished, without wheezing or rhonchi  MUSCULOSKELETAL:  Nonpitting edema (R>L - chronic and stable per her report); No  deformity  SKIN: Warm and  dry NEUROLOGIC:  Alert and oriented x 3 PSYCHIATRIC:  Normal affect   ASSESSMENT:    1. Chronic heart failure with preserved ejection fraction (HFpEF) ([place])   2. Coronary artery disease involving native heart without angina pectoris, unspecified vessel or lesion type   3. Persistent atrial fibrillation ([place])   4. History of bradycardia   5. S/P aortic valve replacement with bioprosthetic valve   6. Essential hypertension, benign   7. Hyperlipidemia, unspecified hyperlipidemia type   8. Chronic obstructive pulmonary disease, unspecified COPD type ([place])   9. Dyspnea on exertion   10. CKD (chronic kidney disease) stage 4, GFR 15-29 ml/min (H[facility])     PLAN:    In order of problems listed above:  Chronic HFpEF Weight is down 2 lbs since last visit. Admits to leg swelling, R>L lower extremity chronically.  Continue Jardiance, Torsemide, and potassium supplementation. GDMT limited with current kidney disease. Not on BB d/t hx of bradycardia. Low sodium diet, fluid restriction <2L, and daily weights encouraged. Educated to contact our office for weight gain of 2 lbs overnight or 5 lbs in one week. Weight log, BP log, and salty six info given. Heart healthy diet and regular cardiovascular exercise as tolerated encouraged.   CAD, s/p CABG in 2009 Stable with no anginal symptoms. No indication for ischemic evaluation. Continue atorvastatin and fenofibrate. Not on Aspirin due to being on Eliquis and not on BB d/t past bradycardia. Heart healthy diet and regular cardiovascular exercise as tolerated encouraged.   Persistent A-fib, hx of bradycardia/pauses/arrhythmia Denies any tachycardia or palpitations. Metoprolol was stopped during past hospitalization due to bradycardia. HR today stable. Cardiac monitor results as mentioned above. Findings revealed two episodes of pauses, longest lasting 3.8 seconds. Result note by Vikki Ports, PA-C shows pt was updated and aware of  monitor result, asymptomatic at the time. Continues to be asymptomatic. I previously offered a EP referral; however, pt would like to watch and monitor at this time. Continue Eliquis 2.5 mg BID, on appropriate dosage. Heart healthy diet and regular cardiovascular exercise as tolerated encouraged.   AS, s/p bioprosthetic AVR Echo 06/2022 revealed 21 mm magna pericardial bioprosthetic aortic valve with trivial regurgitation noted.  Continue current medication regimen. SBE prophylaxis discussed. Heart healthy diet and regular cardiovascular exercise as tolerated encouraged.   HTN Blood pressure elevated today on arrival, repeat BP normal. Discussed to monitor BP at home at least 2 hours after medications and sitting for 5-10 minutes. Continue current medication regimen.  Previously recommended OMRON cuff. Low salt, heart healthy diet recommended.   HLD Recent lipid panel revealed LDL 85. Continue atorvastatin and fenofibrate. Heart healthy diet and regular cardiovascular exercise as tolerated encouraged.   COPD, DOE Pt admits to DOE, stable. Recent CXR normal. Continue current medication regimen. Continue to f/u with PCP, she politely declines pulmonology referral at this time, which I recommended. Continue oxygen therapy.   CKD stage IV Recent sCr was 1.82 with eGFR at 28. At last visit, I placed a referral to Nephrology. Avoid nephrotoxic agents.   Disposition: Follow-up with Dr. Domenic Polite as scheduled in May or sooner if anything changes.    Medication Adjustments/Labs and Tests Ordered: Current medicines are reviewed at length with the patient today.  Concerns regarding medicines are outlined above.  No orders of the defined types were placed in this encounter.  No orders of the defined types were placed in this encounter.   Patient Instructions  Medication Instructions:  Your physician recommends that you continue  on your current medications as directed. Please refer to the Current  Medication list given to you today.   Labwork: none  Testing/Procedures: none  Follow-Up:  Your physician recommends that you schedule a follow-up appointment in: Follow up in May  Any Other Special Instructions Will Be Listed Below (If Applicable).  If you need a refill on your cardiac medications before your next appointment, please call your pharmacy.    Signed, Finis Bud, NP  09/03/2022 2:25 PM    [place]

## 2022-09-03 NOTE — Patient Instructions (Signed)
Medication Instructions:  Your physician recommends that you continue on your current medications as directed. Please refer to the Current Medication list given to you today.   Labwork: none  Testing/Procedures: none  Follow-Up:  Your physician recommends that you schedule a follow-up appointment in: Follow up in May  Any Other Special Instructions Will Be Listed Below (If Applicable).  If you need a refill on your cardiac medications before your next appointment, please call your pharmacy.

## 2022-09-04 ENCOUNTER — Other Ambulatory Visit: Payer: Self-pay | Admitting: Cardiology

## 2022-09-04 DIAGNOSIS — I5020 Unspecified systolic (congestive) heart failure: Secondary | ICD-10-CM | POA: Diagnosis not present

## 2022-09-04 DIAGNOSIS — R0602 Shortness of breath: Secondary | ICD-10-CM | POA: Diagnosis not present

## 2022-09-05 DIAGNOSIS — I5033 Acute on chronic diastolic (congestive) heart failure: Secondary | ICD-10-CM | POA: Diagnosis not present

## 2022-09-05 DIAGNOSIS — E873 Alkalosis: Secondary | ICD-10-CM | POA: Diagnosis not present

## 2022-09-05 DIAGNOSIS — I129 Hypertensive chronic kidney disease with stage 1 through stage 4 chronic kidney disease, or unspecified chronic kidney disease: Secondary | ICD-10-CM | POA: Diagnosis not present

## 2022-09-11 DIAGNOSIS — N189 Chronic kidney disease, unspecified: Secondary | ICD-10-CM | POA: Diagnosis not present

## 2022-09-11 DIAGNOSIS — D531 Other megaloblastic anemias, not elsewhere classified: Secondary | ICD-10-CM | POA: Diagnosis not present

## 2022-09-11 DIAGNOSIS — Z5181 Encounter for therapeutic drug level monitoring: Secondary | ICD-10-CM | POA: Diagnosis not present

## 2022-09-11 DIAGNOSIS — I359 Nonrheumatic aortic valve disorder, unspecified: Secondary | ICD-10-CM | POA: Diagnosis not present

## 2022-09-11 DIAGNOSIS — I130 Hypertensive heart and chronic kidney disease with heart failure and stage 1 through stage 4 chronic kidney disease, or unspecified chronic kidney disease: Secondary | ICD-10-CM | POA: Diagnosis not present

## 2022-09-11 DIAGNOSIS — I5033 Acute on chronic diastolic (congestive) heart failure: Secondary | ICD-10-CM | POA: Diagnosis not present

## 2022-09-11 DIAGNOSIS — D638 Anemia in other chronic diseases classified elsewhere: Secondary | ICD-10-CM | POA: Diagnosis not present

## 2022-09-11 DIAGNOSIS — B700 Diphyllobothriasis: Secondary | ICD-10-CM | POA: Diagnosis not present

## 2022-09-11 DIAGNOSIS — I129 Hypertensive chronic kidney disease with stage 1 through stage 4 chronic kidney disease, or unspecified chronic kidney disease: Secondary | ICD-10-CM | POA: Diagnosis not present

## 2022-09-11 DIAGNOSIS — I4819 Other persistent atrial fibrillation: Secondary | ICD-10-CM | POA: Diagnosis not present

## 2022-09-11 DIAGNOSIS — E873 Alkalosis: Secondary | ICD-10-CM | POA: Diagnosis not present

## 2022-09-13 DIAGNOSIS — I5020 Unspecified systolic (congestive) heart failure: Secondary | ICD-10-CM | POA: Diagnosis not present

## 2022-09-13 DIAGNOSIS — R0602 Shortness of breath: Secondary | ICD-10-CM | POA: Diagnosis not present

## 2022-09-18 ENCOUNTER — Other Ambulatory Visit: Payer: Self-pay | Admitting: *Deleted

## 2022-09-18 ENCOUNTER — Telehealth: Payer: Self-pay | Admitting: Cardiology

## 2022-09-18 MED ORDER — METOLAZONE 5 MG PO TABS: 5.0000 mg | ORAL_TABLET | ORAL | 0 refills | Status: DC

## 2022-09-18 NOTE — Telephone Encounter (Signed)
Pt c/o swelling: STAT is pt has developed SOB within 24 hours  If swelling, where is the swelling located? Both feet and legs, patient states mainly the right one.   How much weight have you gained and in what time span? 2lbs in two days.   Have you gained 3 pounds in a day or 5 pounds in a week? no  Do you have a log of your daily weights (if so, list)? no  Are you currently taking a fluid pill? yes  Are you currently SOB? no  Have you traveled recently? No  Patient states she took extra lasix for the past three days.

## 2022-09-18 NOTE — Telephone Encounter (Signed)
Reports increased swelling in legs and feet. Reports taking an extra torsemide 20 mg on Sunday , Monday and  Tuesday. Says she took one extra K+ 20 meq on yesterday. Says swelling did not improve. Denies change in diet or fluid intake. Denies SOB, dizziness or chest pain. Reports recent labs done on 09/11/2022 for [name] and is available in [place]. Reports weighing daily- Saturday (03/23) 229.4 lbs, Sunday (03/24) 230.1 lbs, Monday (03/25) 230.1 lbs, Tuesday (03/26) 231.4 lbs, Wednesday (03/27) 231.4. Does not check BP or HR at home. Will forward to provider.

## 2022-09-18 NOTE — Telephone Encounter (Signed)
Patient informed and verbalized understanding of plan. Says she has metolazone 5 mg on hand and will use those.

## 2022-09-18 NOTE — Telephone Encounter (Signed)
Called back and says she does not have any metolazone on hand. Advised that new rx would be sent to [facility].

## 2022-09-27 DIAGNOSIS — Z5181 Encounter for therapeutic drug level monitoring: Secondary | ICD-10-CM | POA: Diagnosis not present

## 2022-09-27 DIAGNOSIS — E873 Alkalosis: Secondary | ICD-10-CM | POA: Diagnosis not present

## 2022-09-27 DIAGNOSIS — I5032 Chronic diastolic (congestive) heart failure: Secondary | ICD-10-CM | POA: Diagnosis not present

## 2022-09-27 DIAGNOSIS — R778 Other specified abnormalities of plasma proteins: Secondary | ICD-10-CM | POA: Diagnosis not present

## 2022-09-27 DIAGNOSIS — D7589 Other specified diseases of blood and blood-forming organs: Secondary | ICD-10-CM | POA: Diagnosis not present

## 2022-09-27 DIAGNOSIS — I129 Hypertensive chronic kidney disease with stage 1 through stage 4 chronic kidney disease, or unspecified chronic kidney disease: Secondary | ICD-10-CM | POA: Diagnosis not present

## 2022-09-30 DIAGNOSIS — Z6841 Body Mass Index (BMI) 40.0 and over, adult: Secondary | ICD-10-CM | POA: Diagnosis not present

## 2022-09-30 DIAGNOSIS — J9611 Chronic respiratory failure with hypoxia: Secondary | ICD-10-CM | POA: Diagnosis not present

## 2022-09-30 DIAGNOSIS — I10 Essential (primary) hypertension: Secondary | ICD-10-CM | POA: Diagnosis not present

## 2022-09-30 DIAGNOSIS — R000 Tachycardia, unspecified: Secondary | ICD-10-CM | POA: Diagnosis not present

## 2022-09-30 DIAGNOSIS — Z299 Encounter for prophylactic measures, unspecified: Secondary | ICD-10-CM | POA: Diagnosis not present

## 2022-09-30 DIAGNOSIS — I5032 Chronic diastolic (congestive) heart failure: Secondary | ICD-10-CM | POA: Diagnosis not present

## 2022-10-03 ENCOUNTER — Other Ambulatory Visit: Payer: Self-pay | Admitting: Cardiology

## 2022-10-05 DIAGNOSIS — R0602 Shortness of breath: Secondary | ICD-10-CM | POA: Diagnosis not present

## 2022-10-05 DIAGNOSIS — I5020 Unspecified systolic (congestive) heart failure: Secondary | ICD-10-CM | POA: Diagnosis not present

## 2022-10-14 DIAGNOSIS — I5020 Unspecified systolic (congestive) heart failure: Secondary | ICD-10-CM | POA: Diagnosis not present

## 2022-10-14 DIAGNOSIS — R0602 Shortness of breath: Secondary | ICD-10-CM | POA: Diagnosis not present

## 2022-10-15 DIAGNOSIS — Z7189 Other specified counseling: Secondary | ICD-10-CM | POA: Diagnosis not present

## 2022-10-15 DIAGNOSIS — Z299 Encounter for prophylactic measures, unspecified: Secondary | ICD-10-CM | POA: Diagnosis not present

## 2022-10-15 DIAGNOSIS — Z0000 Encounter for general adult medical examination without abnormal findings: Secondary | ICD-10-CM | POA: Diagnosis not present

## 2022-10-15 DIAGNOSIS — Z6841 Body Mass Index (BMI) 40.0 and over, adult: Secondary | ICD-10-CM | POA: Diagnosis not present

## 2022-10-15 DIAGNOSIS — Z87891 Personal history of nicotine dependence: Secondary | ICD-10-CM | POA: Diagnosis not present

## 2022-10-15 DIAGNOSIS — I10 Essential (primary) hypertension: Secondary | ICD-10-CM | POA: Diagnosis not present

## 2022-10-15 DIAGNOSIS — Z1339 Encounter for screening examination for other mental health and behavioral disorders: Secondary | ICD-10-CM | POA: Diagnosis not present

## 2022-10-15 DIAGNOSIS — Z1331 Encounter for screening for depression: Secondary | ICD-10-CM | POA: Diagnosis not present

## 2022-10-30 DIAGNOSIS — I5032 Chronic diastolic (congestive) heart failure: Secondary | ICD-10-CM | POA: Diagnosis not present

## 2022-11-04 DIAGNOSIS — I5020 Unspecified systolic (congestive) heart failure: Secondary | ICD-10-CM | POA: Diagnosis not present

## 2022-11-04 DIAGNOSIS — R0602 Shortness of breath: Secondary | ICD-10-CM | POA: Diagnosis not present

## 2022-11-07 ENCOUNTER — Encounter: Payer: Self-pay | Admitting: Cardiology

## 2022-11-07 ENCOUNTER — Ambulatory Visit: Payer: Medicare PPO | Attending: Cardiology | Admitting: Cardiology

## 2022-11-07 VITALS — BP 136/64 | HR 40 | Ht 63.0 in | Wt 237.2 lb

## 2022-11-07 DIAGNOSIS — N1832 Chronic kidney disease, stage 3b: Secondary | ICD-10-CM

## 2022-11-07 DIAGNOSIS — I5032 Chronic diastolic (congestive) heart failure: Secondary | ICD-10-CM

## 2022-11-07 DIAGNOSIS — I495 Sick sinus syndrome: Secondary | ICD-10-CM | POA: Diagnosis not present

## 2022-11-07 MED ORDER — METOLAZONE 2.5 MG PO TABS: 2.5000 mg | ORAL_TABLET | ORAL | 0 refills | Status: DC

## 2022-11-07 MED ORDER — METOPROLOL SUCCINATE ER 25 MG PO TB24: 25.0000 mg | ORAL_TABLET | Freq: Every day | ORAL | 6 refills | Status: DC

## 2022-11-07 MED ORDER — POTASSIUM CHLORIDE CRYS ER 20 MEQ PO TBCR: 20.0000 meq | EXTENDED_RELEASE_TABLET | Freq: Every day | ORAL | 6 refills | Status: DC

## 2022-11-07 MED ORDER — TORSEMIDE 20 MG PO TABS: 60.0000 mg | ORAL_TABLET | Freq: Two times a day (BID) | ORAL | 11 refills | Status: AC

## 2022-11-07 NOTE — Progress Notes (Signed)
Cardiology Office Note  Date: 11/07/2022   ID: [name], DOB 1940-09-06, MRN [id number]  History of Present Illness: [name] is an 82 y.o. female last seen in March by Ms. Philis Nettle NP, I reviewed the note.  She is here for a follow-up visit.  States that she feels fatigued, no chest pain, no dizziness or syncope.  Her weight is up around 6 pounds per review of home checks.  She typically takes Demadex 60 mg twice daily with potassium supplement, no longer has as needed Zaroxolyn tablets however.  Oxygen saturation found to be 89% today, she has a new portable unit.  Heart rate also in the 40s and regular on Toprol-XL 50 mg daily.  She does not report any spontaneous bleeding problems on Eliquis and otherwise continues on stable medical therapy.  I reviewed her recent lab work which is noted below.  Physical Exam: VS:  BP 136/64   Pulse (!) 40   Ht 5\' 3"  (1.6 m)   Wt 237 lb 3.2 oz (107.6 kg)   LMP  (LMP Unknown)   SpO2 (!) 89% Comment: 2 liters of oxygen  BMI 42.02 kg/m , BMI Body mass index is 42.02 kg/m.  Wt Readings from Last 3 Encounters:  11/07/22 237 lb 3.2 oz (107.6 kg)  09/03/22 233 lb 6.4 oz (105.9 kg)  08/05/22 235 lb 9.6 oz (106.9 kg)    General: Wearing oxygen via nasal cannula, appears comfortable. HEENT: Conjunctiva and lids normal. Neck: Supple, difficult to assess JVP, no bruits. Lungs: Decreased breath sounds without wheezing. Cardiac: RRR with 2/6 systolic murmur. Extremities: Chronic appearing, 1-2+ leg edema.  ECG:  An ECG dated 07/05/2022 was personally reviewed today and demonstrated:  Sinus rhythm with right bundle branch block.  Labwork: 07/06/2022: Hemoglobin 10.4; Platelets 178 07/07/2022: Magnesium 2.3 07/08/2022: ALT 10; AST 23 08/05/2022: BNP 36.1; BUN 40; Creatinine, Ser 1.82; Potassium 4.6; Sodium 142     Component Value Date/Time   CHOL 164 07/02/2022 0058   TRIG 232 (H) 07/02/2022 0058   HDL 33 (L) 07/02/2022 0058   CHOLHDL 5.0  07/02/2022 0058   VLDL 46 (H) 07/02/2022 0058   LDLCALC 85 07/02/2022 0058  May 2024: Potassium 4.0, BUN 54, creatinine 1.93, hemoglobin 12.0, platelets 177  Other Studies Reviewed Today:  Echocardiogram 07/05/2022:  1. Left ventricular ejection fraction, by estimation, is 65 to 70%. The  left ventricle has normal function. The left ventricle has no regional  wall motion abnormalities. There is moderate concentric left ventricular  hypertrophy. Indeterminate diastolic  filling due to E-A fusion.   2. Right ventricular systolic function was not well visualized. The right  ventricular size is not well visualized. There is normal pulmonary artery  systolic pressure. The estimated right ventricular systolic pressure is  12.1 mmHg.   3. The mitral valve is degenerative. Trivial mitral valve regurgitation.  No evidence of mitral stenosis.   4. 21 mm Magna pericardial bioprosthetic aortic valve. Vmax 2.5 m/s, MG  14 mmHg, EOA 1.96 cm2, DI 0.62. Trivial regurgitation noted. The aortic  valve has been repaired/replaced. Aortic valve regurgitation is trivial.  Procedure Date: 12/10/2007.   5. The inferior vena cava is normal in size with greater than 50%  respiratory variability, suggesting right atrial pressure of 3 mmHg.   Assessment and Plan:  1.  HFpEF, LVEF 65 to 70% with moderate LVH and normal estimated RVSP by echocardiogram in January.  She presents today with evidence of fluid gain.  Plan is to provide metolazone 2.5 mg tablets to be used sparingly for no more than 1 to 2 days at a time with weight gain of 2 to 3 pounds in 24 hours or 5 pounds over a week.  She also has standing Demadex 60 mg twice daily, this could be temporarily increased to 80 mg twice daily for similar parameters.  Continue potassium supplement.  She is also on Jardiance.  2.  Multivessel CAD status post CABG in 2009 with LIMA to LAD and SVG to RCA.  She reports no active angina at this time.  Not on aspirin given use  of Eliquis.  Continue Lipitor.  3.  History of aortic stenosis status post AVR in 2009 with 21 mm [facility] pericardial prosthesis.  Echocardiogram in January revealed trivial aortic regurgitation with mean AV gradient 14 mmHg.  No significant change in cardiac murmur.  4.  Persistent atrial fibrillation/flutter with CHA2DS2-VASc score of 5.  Heart rate is regular today and she is bradycardic.  Likely has conduction system disease and propensity to tachycardia-bradycardia syndrome.  Plan for now is to cut Toprol-XL back to 25 mg daily.  Will bring her back for a nurse check an ECG in about a week.  May need to reduce this further.  5.  CKD stage IIIb, creatinine 1.82 in February.  6.  Chronic hypoxic respiratory failure, she remains on supplemental oxygen at 2 L nasal cannula.  She will recheck oxygen saturation on home concentrator, all of her recent checks have been normal at home.  Disposition:  Follow up  3 months.  Signed, [name], M.D., F.A.C.C. [facility]

## 2022-11-07 NOTE — Patient Instructions (Addendum)
Medication Instructions:  Your physician has recommended you make the following change in your medication:  Decrease metoprolol succinate to 25 mg daily Please take metolazone 2.5 mg daily for 1-2 days to get weight down. Take an extra potassium with it You may take an extra torsemide 20 mg twice daily as needed for weight gain of 2-3lbs/day or 5 lbs/week Continue other medications the same  Labwork: none  Testing/Procedures: none  Follow-Up: Your physician recommends that you schedule a follow-up appointment in: 3 months Your physician recommends that you schedule a follow-up appointment in: 7-10 days for nurse visit  Any Other Special Instructions Will Be Listed Below (If Applicable). Please check your oxygen equipment when you get home  If you need a refill on your cardiac medications before your next appointment, please call your pharmacy.

## 2022-11-13 DIAGNOSIS — R0602 Shortness of breath: Secondary | ICD-10-CM | POA: Diagnosis not present

## 2022-11-13 DIAGNOSIS — I5020 Unspecified systolic (congestive) heart failure: Secondary | ICD-10-CM | POA: Diagnosis not present

## 2022-11-14 ENCOUNTER — Encounter: Payer: Self-pay | Admitting: *Deleted

## 2022-11-14 ENCOUNTER — Ambulatory Visit: Payer: Medicare PPO | Attending: Cardiology | Admitting: *Deleted

## 2022-11-14 VITALS — BP 120/50 | HR 42 | Ht 63.0 in | Wt 226.6 lb

## 2022-11-14 DIAGNOSIS — I4819 Other persistent atrial fibrillation: Secondary | ICD-10-CM | POA: Diagnosis not present

## 2022-11-14 MED ORDER — METOPROLOL SUCCINATE ER 25 MG PO TB24: 25.0000 mg | ORAL_TABLET | Freq: Every day | ORAL | 6 refills | Status: DC

## 2022-11-14 NOTE — Progress Notes (Signed)
Per Dr. Wyline Mood would lower toprol back to 25 mg daily. Can give her some lopressor 25mg  prn palpitations or palpitations or high heart rates above 110 at rest

## 2022-11-14 NOTE — Patient Instructions (Addendum)
Please reduce metoprolol succinate back to 25 mg daily as suggested at last visit. Return in 1 week for a nurse visit for EKG

## 2022-11-14 NOTE — Progress Notes (Signed)
Presents to office for EKG per last visit for low heart rate. Medications reviewed and patient reports increasing Toprol XL back to 50 mg daily on Sunday 11/10/2022 due to HR showing 134 by pulse oximeter. Denies symptoms of heart racing, palpitations, fatigue or SOB at that time. Denies dizziness, chest pain or SOB. EKG done and vitals taken. [facility] contacted to fill 25 mg daily Toprol XL. Routed visit to provider for review

## 2022-11-21 ENCOUNTER — Ambulatory Visit: Payer: Medicare PPO | Attending: Internal Medicine | Admitting: *Deleted

## 2022-11-21 ENCOUNTER — Encounter: Payer: Self-pay | Admitting: *Deleted

## 2022-11-21 DIAGNOSIS — I4819 Other persistent atrial fibrillation: Secondary | ICD-10-CM | POA: Diagnosis not present

## 2022-11-21 NOTE — Progress Notes (Signed)
Presents to office for EKG per last visit for low heart rate. Medications reviewed. Reports taking all medications as prescribed without missing doses. Denies dizziness, chest pain or SOB. EKG done and routed to provider for review.

## 2022-11-21 NOTE — Patient Instructions (Addendum)
Your physician recommends that you continue on your current medications as directed. Please refer to the Current Medication list given to you today. Your physician recommends that you schedule a follow-up appointment in: as planned 

## 2022-11-21 NOTE — Progress Notes (Signed)
   Covering for Dr. Diona Browner - HR much improved after dose reduction of Toprol-XL. Continue current medication regimen.   Signed, Ellsworth Lennox, PA-C 11/21/2022, 3:49 PM

## 2022-12-05 DIAGNOSIS — R0602 Shortness of breath: Secondary | ICD-10-CM | POA: Diagnosis not present

## 2022-12-05 DIAGNOSIS — I5020 Unspecified systolic (congestive) heart failure: Secondary | ICD-10-CM | POA: Diagnosis not present

## 2022-12-12 DIAGNOSIS — Z0000 Encounter for general adult medical examination without abnormal findings: Secondary | ICD-10-CM | POA: Diagnosis not present

## 2022-12-12 DIAGNOSIS — I10 Essential (primary) hypertension: Secondary | ICD-10-CM | POA: Diagnosis not present

## 2022-12-12 DIAGNOSIS — Z79899 Other long term (current) drug therapy: Secondary | ICD-10-CM | POA: Diagnosis not present

## 2022-12-12 DIAGNOSIS — E7800 Pure hypercholesterolemia, unspecified: Secondary | ICD-10-CM | POA: Diagnosis not present

## 2022-12-12 DIAGNOSIS — Z87891 Personal history of nicotine dependence: Secondary | ICD-10-CM | POA: Diagnosis not present

## 2022-12-12 DIAGNOSIS — Z299 Encounter for prophylactic measures, unspecified: Secondary | ICD-10-CM | POA: Diagnosis not present

## 2022-12-12 DIAGNOSIS — E039 Hypothyroidism, unspecified: Secondary | ICD-10-CM | POA: Diagnosis not present

## 2022-12-12 DIAGNOSIS — R5383 Other fatigue: Secondary | ICD-10-CM | POA: Diagnosis not present

## 2022-12-14 DIAGNOSIS — I5020 Unspecified systolic (congestive) heart failure: Secondary | ICD-10-CM | POA: Diagnosis not present

## 2022-12-14 DIAGNOSIS — R0602 Shortness of breath: Secondary | ICD-10-CM | POA: Diagnosis not present

## 2022-12-31 ENCOUNTER — Other Ambulatory Visit: Payer: Self-pay | Admitting: Cardiology

## 2022-12-31 NOTE — Telephone Encounter (Signed)
Prescription refill request for Eliquis received. Indication: AF Last office visit: 11/07/22  Ival Bible MD Scr: 1.93 on 10/30/22  Epic Age: 82 Weight: 107.6kg  Based on above findings Eliquis 2.5mg  twice daily is the appropriate dose.  Refill approved.

## 2023-01-04 DIAGNOSIS — R0602 Shortness of breath: Secondary | ICD-10-CM | POA: Diagnosis not present

## 2023-01-04 DIAGNOSIS — I5020 Unspecified systolic (congestive) heart failure: Secondary | ICD-10-CM | POA: Diagnosis not present

## 2023-01-08 DIAGNOSIS — R001 Bradycardia, unspecified: Secondary | ICD-10-CM | POA: Diagnosis not present

## 2023-01-08 DIAGNOSIS — J9601 Acute respiratory failure with hypoxia: Secondary | ICD-10-CM | POA: Diagnosis not present

## 2023-01-08 DIAGNOSIS — I2510 Atherosclerotic heart disease of native coronary artery without angina pectoris: Secondary | ICD-10-CM | POA: Diagnosis not present

## 2023-01-08 DIAGNOSIS — I10 Essential (primary) hypertension: Secondary | ICD-10-CM | POA: Diagnosis not present

## 2023-01-08 DIAGNOSIS — I959 Hypotension, unspecified: Secondary | ICD-10-CM | POA: Diagnosis not present

## 2023-01-08 DIAGNOSIS — R0689 Other abnormalities of breathing: Secondary | ICD-10-CM | POA: Diagnosis not present

## 2023-01-08 DIAGNOSIS — Z79899 Other long term (current) drug therapy: Secondary | ICD-10-CM | POA: Diagnosis not present

## 2023-01-08 DIAGNOSIS — J9602 Acute respiratory failure with hypercapnia: Secondary | ICD-10-CM | POA: Diagnosis not present

## 2023-01-08 DIAGNOSIS — I480 Paroxysmal atrial fibrillation: Secondary | ICD-10-CM | POA: Diagnosis not present

## 2023-01-08 DIAGNOSIS — R7989 Other specified abnormal findings of blood chemistry: Secondary | ICD-10-CM | POA: Diagnosis not present

## 2023-01-08 DIAGNOSIS — E873 Alkalosis: Secondary | ICD-10-CM | POA: Diagnosis not present

## 2023-01-08 DIAGNOSIS — Z7901 Long term (current) use of anticoagulants: Secondary | ICD-10-CM | POA: Diagnosis not present

## 2023-01-08 DIAGNOSIS — Z6841 Body Mass Index (BMI) 40.0 and over, adult: Secondary | ICD-10-CM | POA: Diagnosis not present

## 2023-01-08 DIAGNOSIS — N184 Chronic kidney disease, stage 4 (severe): Secondary | ICD-10-CM | POA: Diagnosis not present

## 2023-01-08 DIAGNOSIS — Z7984 Long term (current) use of oral hypoglycemic drugs: Secondary | ICD-10-CM | POA: Diagnosis not present

## 2023-01-08 DIAGNOSIS — Z952 Presence of prosthetic heart valve: Secondary | ICD-10-CM | POA: Diagnosis not present

## 2023-01-08 DIAGNOSIS — J811 Chronic pulmonary edema: Secondary | ICD-10-CM | POA: Diagnosis not present

## 2023-01-08 DIAGNOSIS — Z951 Presence of aortocoronary bypass graft: Secondary | ICD-10-CM | POA: Diagnosis not present

## 2023-01-08 DIAGNOSIS — I700 Atherosclerosis of aorta: Secondary | ICD-10-CM | POA: Diagnosis not present

## 2023-01-08 DIAGNOSIS — Z9981 Dependence on supplemental oxygen: Secondary | ICD-10-CM | POA: Diagnosis not present

## 2023-01-08 DIAGNOSIS — Z20822 Contact with and (suspected) exposure to covid-19: Secondary | ICD-10-CM | POA: Diagnosis not present

## 2023-01-08 DIAGNOSIS — D72829 Elevated white blood cell count, unspecified: Secondary | ICD-10-CM | POA: Diagnosis not present

## 2023-01-08 DIAGNOSIS — I2489 Other forms of acute ischemic heart disease: Secondary | ICD-10-CM | POA: Diagnosis not present

## 2023-01-08 DIAGNOSIS — R0602 Shortness of breath: Secondary | ICD-10-CM | POA: Diagnosis not present

## 2023-01-08 DIAGNOSIS — I130 Hypertensive heart and chronic kidney disease with heart failure and stage 1 through stage 4 chronic kidney disease, or unspecified chronic kidney disease: Secondary | ICD-10-CM | POA: Diagnosis not present

## 2023-01-08 DIAGNOSIS — I517 Cardiomegaly: Secondary | ICD-10-CM | POA: Diagnosis not present

## 2023-01-08 DIAGNOSIS — I071 Rheumatic tricuspid insufficiency: Secondary | ICD-10-CM | POA: Diagnosis not present

## 2023-01-08 DIAGNOSIS — I5020 Unspecified systolic (congestive) heart failure: Secondary | ICD-10-CM | POA: Diagnosis not present

## 2023-01-08 DIAGNOSIS — J449 Chronic obstructive pulmonary disease, unspecified: Secondary | ICD-10-CM | POA: Diagnosis not present

## 2023-01-08 DIAGNOSIS — Z7952 Long term (current) use of systemic steroids: Secondary | ICD-10-CM | POA: Diagnosis not present

## 2023-01-08 DIAGNOSIS — I509 Heart failure, unspecified: Secondary | ICD-10-CM | POA: Diagnosis not present

## 2023-01-08 DIAGNOSIS — I5033 Acute on chronic diastolic (congestive) heart failure: Secondary | ICD-10-CM | POA: Diagnosis not present

## 2023-01-08 DIAGNOSIS — R062 Wheezing: Secondary | ICD-10-CM | POA: Diagnosis not present

## 2023-01-08 DIAGNOSIS — J9621 Acute and chronic respiratory failure with hypoxia: Secondary | ICD-10-CM | POA: Diagnosis not present

## 2023-01-08 DIAGNOSIS — I272 Pulmonary hypertension, unspecified: Secondary | ICD-10-CM | POA: Diagnosis not present

## 2023-01-14 DIAGNOSIS — R7989 Other specified abnormal findings of blood chemistry: Secondary | ICD-10-CM | POA: Diagnosis not present

## 2023-01-14 DIAGNOSIS — I509 Heart failure, unspecified: Secondary | ICD-10-CM | POA: Diagnosis not present

## 2023-01-14 DIAGNOSIS — R001 Bradycardia, unspecified: Secondary | ICD-10-CM | POA: Diagnosis not present

## 2023-01-14 DIAGNOSIS — J9601 Acute respiratory failure with hypoxia: Secondary | ICD-10-CM | POA: Diagnosis not present

## 2023-01-14 DIAGNOSIS — J9602 Acute respiratory failure with hypercapnia: Secondary | ICD-10-CM | POA: Diagnosis not present

## 2023-01-14 DIAGNOSIS — N184 Chronic kidney disease, stage 4 (severe): Secondary | ICD-10-CM | POA: Diagnosis not present

## 2023-01-15 ENCOUNTER — Ambulatory Visit: Payer: Medicare PPO | Admitting: Cardiology

## 2023-01-15 ENCOUNTER — Telehealth: Payer: Self-pay | Admitting: Cardiology

## 2023-01-15 ENCOUNTER — Encounter: Payer: Self-pay | Admitting: Cardiology

## 2023-01-15 ENCOUNTER — Ambulatory Visit: Payer: Medicare PPO | Attending: Cardiology | Admitting: Cardiology

## 2023-01-15 VITALS — BP 140/78 | HR 88 | Ht 63.0 in | Wt 232.4 lb

## 2023-01-15 DIAGNOSIS — I5032 Chronic diastolic (congestive) heart failure: Secondary | ICD-10-CM | POA: Diagnosis not present

## 2023-01-15 DIAGNOSIS — I4819 Other persistent atrial fibrillation: Secondary | ICD-10-CM

## 2023-01-15 DIAGNOSIS — I495 Sick sinus syndrome: Secondary | ICD-10-CM | POA: Diagnosis not present

## 2023-01-15 DIAGNOSIS — N1832 Chronic kidney disease, stage 3b: Secondary | ICD-10-CM | POA: Diagnosis not present

## 2023-01-15 DIAGNOSIS — Z79899 Other long term (current) drug therapy: Secondary | ICD-10-CM | POA: Diagnosis not present

## 2023-01-15 MED ORDER — METOLAZONE 2.5 MG PO TABS: 2.5000 mg | ORAL_TABLET | ORAL | Status: DC

## 2023-01-15 NOTE — Telephone Encounter (Signed)
Pt c/o medication issue:  1. Name of Medication: Losartan   metolazone (ZAROXOLYN) 2.5 MG tablet    2. How are you currently taking this medication (dosage and times per day)?   3. Are you having a reaction (difficulty breathing--STAT)?   4. What is your medication issue? Patient's friend is calling to get medication called into the pharmacy. She states that metolazone only had 4 pills for pickup and the Losartan was not called in.

## 2023-01-15 NOTE — Patient Instructions (Addendum)
Medication Instructions:  Your physician has recommended you make the following change in your medication:  Take metolazone 2.5 mg weekly, then as needed per discharge instructions Continue all other medications as prescribed  Labwork: BEMT in 10 days (01/27/2023) Non-fasting [facility]  Testing/Procedures: none  Follow-Up: Your physician recommends that you schedule a follow-up appointment in: August as planned  Any Other Special Instructions Will Be Listed Below (If Applicable). Your physician recommends that you weigh, daily, at the same time every day, and in the same amount of clothing. Please record your daily weights on the handout provided and bring it to your next appointment.   If you need a refill on your cardiac medications before your next appointment, please call your pharmacy.

## 2023-01-15 NOTE — Progress Notes (Signed)
Cardiology Office Note  Date: 01/15/2023   ID: [name], DOB August 02, 1940, MRN [id number]  History of Present Illness: [name] is an 82 y.o. female last seen in May.  She presents to the office having recently been discharged from [facility], I reviewed the available records.  She is here today with two family members.  She was hospitalized with acute on chronic hypoxic/hypercarbic respiratory failure, initially placed on BiPAP, diuresed and also treated with steroids as well as nebulizer treatments.  She was apparently seen by cardiology during her stay, discharged on Demadex 60 mg twice daily with once weekly metolazone 2.5 mg (and as needed use for weight gain).  Also continued on Eliquis, Jardiance, and Cozaar.  Recommended resuming low-dose Toprol-XL 12.5 mg daily, however had presented significantly bradycardic on 25 mg daily. Follow-up echocardiogram done on July 22 is noted below.  She states that she definitely feels better today, lost about 10 pounds by her recollection with diuresis.  She remains on supplemental oxygen 2 L via nasal cannula.  I reinforced with her the importance of checking daily weights.  I think her current cardiac regimen is certainly reasonable, although this is going to be a moving target and she will require follow-up lab work and clinical reevaluation.  Agree with weekly metolazone for now as well.  They are planning to get a blood pressure cuff for home use.  She is also wearing a Zio patch which will provide better assessment of heart rate variability.  Physical Exam: VS:  BP (!) 140/78   Pulse 88   Ht 5\' 3"  (1.6 m)   Wt 232 lb 6.4 oz (105.4 kg)   LMP  (LMP Unknown)   SpO2 98%   BMI 41.17 kg/m , BMI Body mass index is 41.17 kg/m.  Wt Readings from Last 3 Encounters:  01/15/23 232 lb 6.4 oz (105.4 kg)  11/14/22 226 lb 9.6 oz (102.8 kg)  11/07/22 237 lb 3.2 oz (107.6 kg)    General: Patient appears comfortable at rest.  Wearing oxygen via  nasal cannula. HEENT: Conjunctiva and lids normal. Neck: Supple, no elevated JVP or carotid bruits. Lungs: Decreased breath sounds without wheezing. Cardiac: Irregular, 2/6 systolic murmur, no gallop. Extremities: Mild lower leg edema.  ECG:  An ECG dated 11/21/2022 was personally reviewed today and demonstrated:  Sinus rhythm with right bundle branch block, PACs.  Labwork: 07/06/2022: Hemoglobin 10.4; Platelets 178 07/07/2022: Magnesium 2.3 07/08/2022: ALT 10; AST 23 08/05/2022: BNP 36.1; BUN 40; Creatinine, Ser 1.82; Potassium 4.6; Sodium 142     Component Value Date/Time   CHOL 164 07/02/2022 0058   TRIG 232 (H) 07/02/2022 0058   HDL 33 (L) 07/02/2022 0058   CHOLHDL 5.0 07/02/2022 0058   VLDL 46 (H) 07/02/2022 0058   LDLCALC 85 07/02/2022 0058  July 2024: Peak high-sensitivity troponin I 284, hemoglobin 11.5, platelets 211, BUN 55, creatinine 1.28, potassium 4.3  Other Studies Reviewed Today:  Echocardiogram 01/13/2023 [facility]): Summary   1. The left ventricle is normal in size with mildly to moderately increased  wall thickness.    2. The left ventricular systolic function is normal, LVEF is visually  estimated at 65-70%.    3. There is grade II diastolic dysfunction (elevated filling pressure).    4. The mitral valve leaflets are mildly thickened with normal leaflet  mobility.   5. Mitral annular calcification is present.    6. Aortic valve replacement (21 mm bioprosthetic, implantation date: 2009).  7. Aortic valve Doppler indices are consistent with normal prosthetic valve  function.   8. The left atrium is moderately dilated in size.    9. The right ventricle is mildly dilated in size, with normal systolic  function.   10. There is mild to moderate tricuspid regurgitation.    11. There is mild-moderate pulmonary hypertension.    12. The right atrium is mildly dilated in size.    13. IVC size and inspiratory change suggest mildly elevated right atrial   pressure. (5-10 mmHg).   Assessment and Plan:  1.  HFpEF, echocardiogram done recently at [facility] showed LVEF 65 to 70% with moderate diastolic dysfunction, mildly dilated RV and mildly elevated RVSP with mild to moderate tricuspid regurgitation.  Current regimen includes Demadex 60 mg twice daily with potassium supplement, also to start metolazone 2.5 mg weekly.  Could take an additional metolazone for weight gain of 2 to 3 pounds in 24 hours or 5 pounds over a week.  Reinforced the importance of checking daily weights.  Would recheck BMET in the next 10 days.   2.  Multivessel CAD status post CABG in 2009 with LIMA to LAD and SVG to RCA.  She reports no active angina at this time.  Not on aspirin given use of Eliquis.  Continue Lipitor.  Recent mildly abnormal high-sensitivity troponin I levels most consistent with demand ischemia rather than ACS.   3.  History of aortic stenosis status post AVR in 2009 with 21 mm [facility] pericardial prosthesis.  Follow-up echocardiogram during recent hospital stay at [facility] showed stable AVR function, mean AV gradient 10 mmHg and trace aortic regurgitation.   4.  Persistent atrial fibrillation/flutter with CHA2DS2-VASc score of 5.  Likely has conduction system disease and propensity to tachycardia-bradycardia syndrome.  Placed on Toprol-XL 12.5 mg daily, she took a dose today and her heart rate is in the 80s.  Continue to wear ZIO monitor to get better assessment of heart rate variability.   5.  CKD stage IIIb, recent creatinine 1.28.   6.  Chronic hypoxic respiratory failure, she remains on supplemental oxygen at 2 L nasal cannula.  Disposition:  Follow up  in mid August as already scheduled.  Signed, [name], M.D., F.A.C.C. [facility]

## 2023-01-16 NOTE — Telephone Encounter (Signed)
Spoke with Santina Evans, she is wanting to know whether Lakina should also be taking Losartan. She didn't get a Rx when discharged from hospital or sent in yesterday after visit with you. Understands that she is to take the Metoprolol succinate 12.5 mg, but wasn't clear on the Losartan 12.5 mg. Reviewed note from yesterdays. Please advise.

## 2023-01-17 DIAGNOSIS — I509 Heart failure, unspecified: Secondary | ICD-10-CM | POA: Diagnosis not present

## 2023-01-17 NOTE — Telephone Encounter (Signed)
Patient informed and verbalized understanding of plan. 

## 2023-01-23 DIAGNOSIS — I10 Essential (primary) hypertension: Secondary | ICD-10-CM | POA: Diagnosis not present

## 2023-01-23 DIAGNOSIS — I5032 Chronic diastolic (congestive) heart failure: Secondary | ICD-10-CM | POA: Diagnosis not present

## 2023-01-23 DIAGNOSIS — I4891 Unspecified atrial fibrillation: Secondary | ICD-10-CM | POA: Diagnosis not present

## 2023-01-23 DIAGNOSIS — Z09 Encounter for follow-up examination after completed treatment for conditions other than malignant neoplasm: Secondary | ICD-10-CM | POA: Diagnosis not present

## 2023-01-23 DIAGNOSIS — N1832 Chronic kidney disease, stage 3b: Secondary | ICD-10-CM | POA: Diagnosis not present

## 2023-01-27 DIAGNOSIS — B700 Diphyllobothriasis: Secondary | ICD-10-CM | POA: Diagnosis not present

## 2023-01-27 DIAGNOSIS — R809 Proteinuria, unspecified: Secondary | ICD-10-CM | POA: Diagnosis not present

## 2023-01-27 DIAGNOSIS — N189 Chronic kidney disease, unspecified: Secondary | ICD-10-CM | POA: Diagnosis not present

## 2023-01-27 DIAGNOSIS — D509 Iron deficiency anemia, unspecified: Secondary | ICD-10-CM | POA: Diagnosis not present

## 2023-01-27 DIAGNOSIS — D649 Anemia, unspecified: Secondary | ICD-10-CM | POA: Diagnosis not present

## 2023-01-27 DIAGNOSIS — I129 Hypertensive chronic kidney disease with stage 1 through stage 4 chronic kidney disease, or unspecified chronic kidney disease: Secondary | ICD-10-CM | POA: Diagnosis not present

## 2023-01-27 DIAGNOSIS — N184 Chronic kidney disease, stage 4 (severe): Secondary | ICD-10-CM | POA: Diagnosis not present

## 2023-01-27 DIAGNOSIS — D638 Anemia in other chronic diseases classified elsewhere: Secondary | ICD-10-CM | POA: Diagnosis not present

## 2023-01-31 DIAGNOSIS — D638 Anemia in other chronic diseases classified elsewhere: Secondary | ICD-10-CM | POA: Diagnosis not present

## 2023-01-31 DIAGNOSIS — D509 Iron deficiency anemia, unspecified: Secondary | ICD-10-CM | POA: Diagnosis not present

## 2023-01-31 DIAGNOSIS — I129 Hypertensive chronic kidney disease with stage 1 through stage 4 chronic kidney disease, or unspecified chronic kidney disease: Secondary | ICD-10-CM | POA: Diagnosis not present

## 2023-01-31 DIAGNOSIS — N184 Chronic kidney disease, stage 4 (severe): Secondary | ICD-10-CM | POA: Diagnosis not present

## 2023-02-04 DIAGNOSIS — R0602 Shortness of breath: Secondary | ICD-10-CM | POA: Diagnosis not present

## 2023-02-04 DIAGNOSIS — R001 Bradycardia, unspecified: Secondary | ICD-10-CM | POA: Diagnosis not present

## 2023-02-04 DIAGNOSIS — I5020 Unspecified systolic (congestive) heart failure: Secondary | ICD-10-CM | POA: Diagnosis not present

## 2023-02-05 NOTE — Progress Notes (Unsigned)
Cardiology Office Note:   Date: 02/06/2023 ID:  KARRA ASCHOFF, DOB 1941-01-26, MRN [id number] PCP:  Ignatius Specking, MD [facility] Providers Cardiologist:  [name], MD   Referring MD: Ignatius Specking, MD  CC: Here for 1 month follow-up for HF History of Present Illness:    [name] is a 82 y.o. female with a PMH of CAD, s/p CABG in 2009, HFpEF, AS, s/p bioprosthetic aortic valve replacement, persistent A-fib, HTN, HLD, COPD, and CKD stage IV, who presents today for 1 month follow-up.   Has a remote history of CABG x 2 with SVG-RCA and LIMA-LAD as well as AVR in 2009.  NST in 2019 was negative for ischemia.  Echocardiogram in June 2023 showed normal EF, with dynamic LVOT and moderate symmetrical LVH.  Aortic valve was stable with normal function and mild stenosis/regurgitation.  She presented to [facility] in January 2024 with shortness of breath despite home use of O2 at 2 to 3 L and had associated chest pressure.  EKG was negative. Troponins were elevated with peak at 2,302. CXR revealed cardiomegaly with mild pulmonary vascular congestion mild by basilar and mid right lung atelectasis.  Noted increasing DOE over the past month. Transferred to [facility] due to concern for NSTEMI.  Diuresed in the ED.  TTE revealed EF 65 to 70%, grade 2 DD, stable aortic valve. Therapy recommended home health PT/OT. Metoprolol was stopped due to patient being bradycardic with heart rate in 30s.   Hospital admission 12/2022 at [facility] for shortness of breath and acute on chronic hypoxic/hypercarbic respiratory failure, placed on BiPAP, diuresed and tx with steroids and nebulizer. EKG showed A-fib with slow ventricular rate in 40's, however was d/c on Toprol 12.5 mg daily. Trop were slightly elevated secondary to chronic heart failure. TTE was updated and full report noted below.  Last seen by Dr. Diona Browner on January 15, 2023.  She was doing well at that time and was diuresed well.  Was still using  supplemental oxygen. Was instructed to take Metolazone 2.5 mg weekly, then as needed per discharge instructions.   Today she presents for follow-up. Says she turned in her Zio monitor about 2 weeks ago. Overall doing well. Weights are stable per her report. SBP's averaging 140's -150's at home. Denies any chest pain, worsening shortness of breath, palpitations, syncope, presyncope, dizziness, orthopnea, PND, significant weight changes, acute bleeding, or claudication.  Please see the history of present illness.    All other systems reviewed and are negative.  EKGs/Labs/Other Studies Reviewed:    The following studies were reviewed today:   EKG:  EKG is not ordered today.    Echo 12/2022 ([facility]): Summary 1. The left ventricle is normal in size with mildly to moderately increased wall thickness. 2. The left ventricular systolic function is normal, LVEF is visually estimated at 65-70%. 3. There is grade II diastolic dysfunction (elevated filling pressure). 4. The mitral valve leaflets are mildly thickened with normal leaflet mobility. 5. Mitral annular calcification is present. 6. Aortic valve replacement (21 mm bioprosthetic, implantation date: 2009). 7. Aortic valve Doppler indices are consistent with normal prosthetic valve function. 8. The left atrium is moderately dilated in size. 9. The right ventricle is mildly dilated in size, with normal systolic function. 10. There is mild to moderate tricuspid regurgitation. 11. There is mild-moderate pulmonary hypertension. 12. The right atrium is mildly dilated in size. 13. IVC size and inspiratory change suggest mildly elevated right atrial pressure. (5-10 mmHg).  Left Ventricle The left ventricle is normal in size with mildly to moderately increased wall thickness. The left ventricular systolic function is normal, LVEF is visually estimated at 65-70%. There is grade II diastolic dysfunction (elevated filling pressure). Right Ventricle The right ventricle is  mildly dilated in size, with normal systolic function. Left Atrium The left atrium is moderately dilated in size. Right Atrium The right atrium is mildly dilated in size. Aortic Valve Aortic valve replacement (21 mm bioprosthetic, implantation date: 2009). The prosthetic aortic valve is well seated. Peak AV transvalvular velocity: 2.0 m/s. Mean gradient: 10 mmHg. Doppler velocity index: 0.47. LVOT diameter: 1.9 cm. LVOT stroke volume index: 33 ml/m2. AV estimated effective orifice area (by continuity equation): 1.5 cm2. There is trace regurgitation of the prosthetic aortic valve. Aortic valve Doppler indices are consistent with normal prosthetic valve function. AV Doppler velocity ratio (dimensionless index): 0.47. Mitral Valve The mitral valve leaflets are mildly thickened with normal leaflet mobility. Mitral annular calcification is present. There is trivial mitral valve regurgitation. Tricuspid Valve The tricuspid valve leaflets are normal, with normal leaflet mobility. There is mild to moderate tricuspid regurgitation. There is no tricuspid valve stenosis. There is mild-moderate pulmonary hypertension. TR maximum velocity: 3.2 m/s Estimated PASP: 48 mmHg. Pulmonic Valve The pulmonic valve is normal. There is mild pulmonic regurgitation. There is no evidence of a significant transvalvular gradient. Aorta The aorta is normal in size in the visualized segments. Inferior Vena Cava IVC size and inspiratory change suggest mildly elevated right atrial pressure. (5-10 mmHg). Pericardium/Pleural There is no pericardial effusion.  Cardiac monitor on 07/31/2022: Predominant rhythm is sinus with IVCD.  Heart rate ranged from 33 bpm up to 123 bpm and average heart rate 64 bpm. Occasional PACs were noted representing 1.9% total beats with otherwise rare atrial couplets and triplets. Occasional PVCs representing 3.4% total beats were noted with otherwise rare ventricular couplets and triplets.  Also limited episodes of  ventricular bigeminy and trigeminy. There were two brief episodes of NSVT noted, the longest of which lasted 6 beats. There were five episodes of PSVT noted, the longest of which lasted 20 beats. There were two pauses noted, the longest of which lasted 3.8 seconds (2:03 AM on February 1).  The second pause was 3 seconds in duration (2:53 PM on January 20).  Neither of these were patient triggered events.  Also intermittently noted junctional rhythm and possible idioventricular rhythm observed in the setting of bradycardia.   Limited Echo 07/05/2022:  1. Left ventricular ejection fraction, by estimation, is 65 to 70%. The  left ventricle has normal function. The left ventricle has no regional  wall motion abnormalities. There is moderate concentric left ventricular  hypertrophy. Indeterminate diastolic  filling due to E-A fusion.   2. Right ventricular systolic function was not well visualized. The right  ventricular size is not well visualized. There is normal pulmonary artery  systolic pressure. The estimated right ventricular systolic pressure is  12.1 mmHg.   3. The mitral valve is degenerative. Trivial mitral valve regurgitation.  No evidence of mitral stenosis.   4. 21 mm Magna pericardial bioprosthetic aortic valve. Vmax 2.5 m/s, MG  14 mmHg, EOA 1.96 cm2, DI 0.62. Trivial regurgitation noted. The aortic  valve has been repaired/replaced. Aortic valve regurgitation is trivial.  Procedure Date: 12/10/2007.   5. The inferior vena cava is normal in size with greater than 50%  respiratory variability, suggesting right atrial pressure of 3 mmHg.   Comparison(s): No significant change from  prior study.   Lexiscan on 12/23/2017: There was no ST segment deviation noted during stress. Sinus rhythm with right bundle branch block and frequent PACs seen throughout study. Defect 1: There is a defect present in the mid anterior and apical anterior location. Images appear to be improved with stress  consistent with reversed redistribution. There are no significant ischemic territories. This is a low risk study. Nuclear stress EF: 48%.  Risk Assessment/Calculations:    CHA2DS2-VASc Score = 6  This indicates a 9.7% annual risk of stroke. The patient's score is based upon: CHF History: 1 HTN History: 1 Diabetes History: 0 Stroke History: 0 Vascular Disease History: 1 Age Score: 2 Gender Score: 1   Physical Exam:    VS:  BP 120/70 (BP Location: Left Arm, Patient Position: Sitting, Cuff Size: Normal)   Pulse 84   Ht 5\' 3"  (1.6 m)   Wt 221 lb 3.2 oz (100.3 kg)   LMP  (LMP Unknown)   SpO2 96%   BMI 39.18 kg/m     Wt Readings from Last 3 Encounters:  02/06/23 221 lb 3.2 oz (100.3 kg)  01/15/23 232 lb 6.4 oz (105.4 kg)  11/14/22 226 lb 9.6 oz (102.8 kg)   GEN: Obese, 82 y.o. female in no acute distress, wearing chronic O2 HEENT: Normal NECK: No JVD; No carotid bruits CARDIAC: S1/S2, RRR, Grade 2/6 murmur, no rubs, no gallops; 2+ pulses RESPIRATORY:  Diminished crackles to bilateral posterior lung bases, overall diminished, without wheezing or rhonchi  MUSCULOSKELETAL:  Nonpitting edema (R>L); No deformity  SKIN: Warm and dry NEUROLOGIC:  Alert and oriented x 3 PSYCHIATRIC:  Normal affect   ASSESSMENT & PLAN:    In order of problems listed above:  Chronic HFpEF Stage C, NYHA class II-III symptoms. Weight is down since last visit. Admits to chronic leg swelling, R>L lower extremity.  Continue Jardiance, Torsemide, Toprol XL (tolerating low dose well - HR well controlled), potassium supplementation, and metolazone as scheduled. GDMT limited with current kidney disease. Low sodium diet, fluid restriction <2L, and daily weights encouraged. Educated to contact our office for weight gain of 2 lbs overnight or 5 lbs in one week. Will refill metolazone per her request.   CAD, s/p CABG in 2009 Stable with no anginal symptoms. No indication for ischemic evaluation. Continue  current medication regimen. Heart healthy diet and regular cardiovascular exercise as tolerated encouraged.   Persistent A-fib, hx of bradycardia/pauses/arrhythmia Denies any tachycardia or palpitations. Most recent Zio monitor is pending. Previous cardiac monitor findings as mentioned above. Continues to be asymptomatic. Appears pt was started on Toprol XL 12.5 mg daily at [facility], tolerating well. Will continue to monitor. Continue Eliquis 2.5 mg BID, on appropriate dosage. Heart healthy diet and regular cardiovascular exercise as tolerated encouraged.   AS, s/p bioprosthetic AVR Echo 12/2022 revealed 21 mm magna pericardial bioprosthetic aortic valve with overall normal valvular function.  Continue current medication regimen. SBE prophylaxis discussed. Heart healthy diet and regular cardiovascular exercise as tolerated encouraged.   HTN Blood pressure on recheck stable.  Discussed SBP goal < 140 and discussed to monitor BP at home at least 2 hours after medications and sitting for 5-10 minutes. Given BP log. Continue current medication regimen.  Low salt, heart healthy diet recommended.  HLD Past lipid panel revealed LDL 85. Continue atorvastatin and fenofibrate. Heart healthy diet and regular cardiovascular exercise as tolerated encouraged.   COPD, lung crackles, DOE Pt admits to DOE, stable. Recent CXR at [facility] negative for  volume overload or infiltrates. Continue current medication regimen. Continue to f/u with Pulmonology and PCP. Continue oxygen therapy.   CKD stage IV Recent sCr was stable. Avoid nephrotoxic agents. Continue to follow-up with Nephrology.   Disposition: Follow-up with me or APP in 2 months or sooner if anything changes.    Medication Adjustments/Labs and Tests Ordered: Current medicines are reviewed at length with the patient today.  Concerns regarding medicines are outlined above.  No orders of the defined types were placed in this encounter.  Meds ordered this  encounter  Medications   metolazone (ZAROXOLYN) 2.5 MG tablet    Sig: Take 1 tablet (2.5 mg total) by mouth as directed. Weekly as directed then as needed. With an extra potassium for weight gain of 3lbs/day or 5 lbs/week    Dispense:  30 tablet    Refill:  2    Patient Instructions  Medication Instructions:   Metolazone refilled today  Continue all other medications.     Labwork:  none  Testing/Procedures:  none  Follow-Up:  2 months   Any Other Special Instructions Will Be Listed Below (If Applicable).   If you need a refill on your cardiac medications before your next appointment, please call your pharmacy.    SignedSharlene Dory, NP  02/08/2023 12:42 PM    [facility]

## 2023-02-06 ENCOUNTER — Encounter: Payer: Self-pay | Admitting: Nurse Practitioner

## 2023-02-06 ENCOUNTER — Ambulatory Visit: Payer: Medicare PPO | Attending: Nurse Practitioner | Admitting: Nurse Practitioner

## 2023-02-06 VITALS — BP 120/70 | HR 84 | Ht 63.0 in | Wt 221.2 lb

## 2023-02-06 DIAGNOSIS — E785 Hyperlipidemia, unspecified: Secondary | ICD-10-CM

## 2023-02-06 DIAGNOSIS — I10 Essential (primary) hypertension: Secondary | ICD-10-CM | POA: Diagnosis not present

## 2023-02-06 DIAGNOSIS — Z87898 Personal history of other specified conditions: Secondary | ICD-10-CM | POA: Diagnosis not present

## 2023-02-06 DIAGNOSIS — I2510 Atherosclerotic heart disease of native coronary artery without angina pectoris: Secondary | ICD-10-CM | POA: Diagnosis not present

## 2023-02-06 DIAGNOSIS — R0609 Other forms of dyspnea: Secondary | ICD-10-CM | POA: Diagnosis not present

## 2023-02-06 DIAGNOSIS — Z953 Presence of xenogenic heart valve: Secondary | ICD-10-CM | POA: Diagnosis not present

## 2023-02-06 DIAGNOSIS — N184 Chronic kidney disease, stage 4 (severe): Secondary | ICD-10-CM

## 2023-02-06 DIAGNOSIS — I5032 Chronic diastolic (congestive) heart failure: Secondary | ICD-10-CM | POA: Diagnosis not present

## 2023-02-06 DIAGNOSIS — I4819 Other persistent atrial fibrillation: Secondary | ICD-10-CM | POA: Diagnosis not present

## 2023-02-06 DIAGNOSIS — J449 Chronic obstructive pulmonary disease, unspecified: Secondary | ICD-10-CM

## 2023-02-06 MED ORDER — METOLAZONE 2.5 MG PO TABS: 2.5000 mg | ORAL_TABLET | ORAL | 2 refills | Status: DC

## 2023-02-06 NOTE — Patient Instructions (Signed)
Medication Instructions:   Metolazone refilled today  Continue all other medications.     Labwork:  none  Testing/Procedures:  none  Follow-Up:  2 months   Any Other Special Instructions Will Be Listed Below (If Applicable).   If you need a refill on your cardiac medications before your next appointment, please call your pharmacy.

## 2023-02-07 ENCOUNTER — Encounter: Payer: Self-pay | Admitting: *Deleted

## 2023-02-07 ENCOUNTER — Telehealth: Payer: Self-pay | Admitting: Cardiology

## 2023-02-07 DIAGNOSIS — I4821 Permanent atrial fibrillation: Secondary | ICD-10-CM | POA: Diagnosis not present

## 2023-02-07 NOTE — Telephone Encounter (Signed)
Pt c/o medication issue:  1. Name of Medication:   metoprolol succinate (TOPROL-XL) 25 MG 24 hr tablet    2. How are you currently taking this medication (dosage and times per day)? Take 12.5 mg by mouth daily.   3. Are you having a reaction (difficulty breathing--STAT)? No  4. What is your medication issue? Sam from [facility] would like to know the correct dosage for the patient. The patient was in the hospital and they changed her dosage to 1/2 a tablet and would like to know if Dr. Diona Browner would like her to take a full tablet or half a tablet. Please advise.

## 2023-02-07 NOTE — Telephone Encounter (Signed)
Sam from [facility] notified.

## 2023-02-08 ENCOUNTER — Encounter: Payer: Self-pay | Admitting: Nurse Practitioner

## 2023-02-11 ENCOUNTER — Telehealth: Payer: Self-pay | Admitting: *Deleted

## 2023-02-11 DIAGNOSIS — R001 Bradycardia, unspecified: Secondary | ICD-10-CM

## 2023-02-11 DIAGNOSIS — I4819 Other persistent atrial fibrillation: Secondary | ICD-10-CM

## 2023-02-11 NOTE — Telephone Encounter (Signed)
Patient informed and verbalized understanding of plan. Copy sent to PCP 

## 2023-02-11 NOTE — Telephone Encounter (Signed)
-----   Message from Sharlene Dory sent at 02/08/2023 11:52 PM EDT ----- Monitor report reviewed. Predominately A-fib with evidence of some pauses, longest pause at 7 seconds. Beta blocker previously stopped in past d/t her bradycardia. Toprol XL was restarted during past hospitalization at [facility]. Please stop Metoprolol and please refer to EP for evaluate ASAP for bradycardia/pauses.   Thanks!  Sharlene Dory, AGNP-C

## 2023-02-12 ENCOUNTER — Ambulatory Visit: Payer: Medicare PPO | Admitting: Nurse Practitioner

## 2023-02-13 ENCOUNTER — Telehealth: Payer: Self-pay | Admitting: Nurse Practitioner

## 2023-02-13 DIAGNOSIS — R0602 Shortness of breath: Secondary | ICD-10-CM | POA: Diagnosis not present

## 2023-02-13 DIAGNOSIS — I5020 Unspecified systolic (congestive) heart failure: Secondary | ICD-10-CM | POA: Diagnosis not present

## 2023-02-13 NOTE — Telephone Encounter (Signed)
Spoke wit patient, concerned that her heart rate is 133 today.  Yesterday was 134.  States she is checking this on her BP monitor.  Was recently told to stop the Toprol on 02/11/2023 & ASAP referral to EP (scheduled for October) by Sharlene Dory, NP.  BP today 129/85.  Has no symptoms at all - states she never feels any different.  Informed patient that the BP monitor may not be picking up HR arcuately if her rhythm is predominately afib.  May need to bring patient to office for EKG / vitals.  [place] office is closed tomorrow.  Will send message to provider for further advice in the meantime.

## 2023-02-13 NOTE — Telephone Encounter (Signed)
Patient notified and verbalized understanding.    Will send message to schedulers to see if appointment could be moved up from October.  States she will go to [place] if she has to get in sooner, but prefers [place] if possible.

## 2023-02-13 NOTE — Telephone Encounter (Signed)
STAT if HR is under 50 or over 120 (normal HR is 60-100 beats per minute)  What is your heart rate? 133  Do you have a log of your heart rate readings (document readings)?    Do you have any other symptoms? No symptoms

## 2023-02-19 NOTE — Progress Notes (Signed)
Electrophysiology Office Note:    Date:  02/20/2023   ID:  [name], DOB 03-16-1941, MRN [id number]  PCP:  Ignatius Specking, MD   [facility] Providers Cardiologist:  [name], MD     Referring MD: Ignatius Specking, MD   History of Present Illness:    [name] is a 82 y.o. female with a medical history significant for persistent atrial fibrillation, coronary disease status post CABG in 2009, aortic stenosis status post bioprosthetic AVR, chronic CHFpEF, COPD, CKD 4, referred for arrhythmia management.     She has a significant cardiac history with two-vessel bypass and bioprosthetic AVR in 2009.  The most recent echo in 2024 showed normal valve function with trivial regurgitation and normal ejection fraction.  She was admitted to [facility] in January 2024 with shortness of breath despite supplemental oxygen and home.  She was transferred to [facility] due to elevated troponin and concern for NSTEMI.  She was diuresed for acute CHFpEF with an EF of 65-70% and grade 2 diastolic dysfunction.  Metoprolol was stopped due to bradycardia with heart rate in the 30s during the admission.  A monitor was placed in January.  Results are detailed below.  Of note she had symptomatic bradycardia with heart rates in the 40s.  This occurred in the middle of the night upon awakening to go to the bathroom.  She was hospitalized again at [facility] in July 2024 with recurrence of shortness of breath.  Hypoxic/hypercarbic respiratory failure was treated with BiPAP, steroids, nebulizer, and diuretics.  She was in atrial fibrillation with slow ventricular rates.  She was discharged on metoprolol 12.5.  A ZIO monitor was placed with results detailed below.  In brief, this showed frequent episodes of atrial fibrillation and likely atypical flutter with poorly controlled rates.  It also showed asymptomatic pauses of up to 7.4 seconds which appeared to be prolonged sinus node refractoriness  after conversion from atrial fibrillation.     Today, she reports that she is at baseline.  She has a log of her heart rates which range up to the 110's.  EKGs/Labs/Other Studies Reviewed Today:    Echocardiogram:  TTE July 05, 2022 Ejection fraction 65 to 70%.  No wall motion abnormalities.  Moderate concentric LVH.  Indeterminate diastolic filling.  Bioprosthetic aortic valve is functioning normally with only trivial regurgitation. Left atrial diameter 4.7 cm   Monitors:  Zio patch 14 days January 2024 - my interpretation Sinus rhythm 33 to 123 bpm, average 64. 1.9% PACs, 3.4% PVCs There were multiple symptom triggered events typically associated with sinus rhythm.  At least one episode of shortness of breath occurred at about 12 AM when the patient got up to go to the bathroom and correlated with sinus and junctional rhythm with a rate of 43 bpm. There were 2 pauses 1 3 seconds, the other 3.8 seconds.  Neither correlated with symptoms.  1 was at 2 AM, the other at 2:53 PM and was preceded by a brief run of wide-complex tachycardia.  Zio monitor July 2024 - my interpretation Sinus rhythm 4216 bpm, average 65 Faxed strips are unintelligible.  There are multiple runs labile atrial flutter ?82% burden of atrial fibrillation/flutter, AVG rate 93 bpm There were multiple pauses, the longest 7.4 seconds.  None was associated with the patient triggered event.  These appear to be postconversion pauses.  Stress testing:   Advanced imaging:   Cardiac catherization   EKG:   EKG  Interpretation Date/Time:  Thursday February 20 2023 11:56:55 EDT Ventricular Rate:  49 PR Interval:  148 QRS Duration:  154 QT Interval:  510 QTC Calculation: 460 R Axis:   43  Text Interpretation: Sinus bradycardia with sinus arrhythmia Right bundle branch block When compared with ECG of 05-Jul-2022 09:33, No significant change was found Confirmed by York Pellant [phone]) on 02/20/2023 12:10:09 PM      Physical Exam:    VS:  BP (!) 162/60   Pulse (!) 49   Ht 5\' 3"  (1.6 m)   Wt 230 lb (104.3 kg)   LMP  (LMP Unknown)   SpO2 96%   BMI 40.74 kg/m     Wt Readings from Last 3 Encounters:  02/20/23 230 lb (104.3 kg)  02/06/23 221 lb 3.2 oz (100.3 kg)  01/15/23 232 lb 6.4 oz (105.4 kg)     GEN:  Well nourished, well developed in no acute distress CARDIAC: brady RR, no murmurs, rubs, gallops RESPIRATORY:  Normal work of breathing MUSCULOSKELETAL: trace edema    ASSESSMENT & PLAN:    Atrial fibrillation with RVR Paroxysmal Symptomatic with high heart rates Medical therapy is limited by sinus bradycardia She is not a good candidate for ablation  Bradycardia; tachybradycardia syndrome She has had both sinus node dysfunction resulting in symptoms of shortness of breath while awake as well as prolonged post-conversion pauses of over 7 seconds documented Additionally, current rate control is inadequate and limited by bradycardia  I discussed the indication for the procedure and the logistics, risks, potential benefit, and after care. I specifically explained that risks include but are not limited to infection, bleeding,damage to blood vessels, lung, and the heart -- but risk of prolonged hospitalization, need for surgery, or the event of stroke, heart attack, or death are low but not zero. After discussion, we made the joint decision to proceed with device placement.  Chronic heart failure with preserved ejection fraction Currently on torsemide and metolazone.  Aortic stenosis status post bioprosthetic aortic valve replacement Continue follow-up in general cardiology clinic  CKD stage IV Limits potential rhythm control options for atrial fibrillation  COPD Has significant shortness of breath at times without overt volume overload Was admitted with hypoxic/hypercarbic respiratory failure    Signed, Maurice Small, MD  02/20/2023 12:10 PM     [facility]

## 2023-02-20 ENCOUNTER — Ambulatory Visit: Payer: Medicare PPO | Attending: Cardiovascular Disease | Admitting: Cardiovascular Disease

## 2023-02-20 ENCOUNTER — Encounter: Payer: Self-pay | Admitting: Cardiovascular Disease

## 2023-02-20 VITALS — BP 162/60 | HR 49 | Ht 63.0 in | Wt 230.0 lb

## 2023-02-20 DIAGNOSIS — I4819 Other persistent atrial fibrillation: Secondary | ICD-10-CM

## 2023-02-20 NOTE — Patient Instructions (Signed)
Medication Instructions:  Your physician recommends that you continue on your current medications as directed. Please refer to the Current Medication list given to you today. *If you need a refill on your cardiac medications before your next appointment, please call your pharmacy*   Testing/Procedures: Dual Chamber Pacemaker - Thursday, October 10 Your physician has recommended that you have a pacemaker inserted. A pacemaker is a small device that is placed under the skin of your chest or abdomen to help control abnormal heart rhythms. This device uses electrical pulses to prompt the heart to beat at a normal rate. Pacemakers are used to treat heart rhythms that are too slow. Wire (leads) are attached to the pacemaker that goes into the chambers of you heart. This is done in the hospital and usually requires and overnight stay. Please see the instruction sheet given to you today for more information.   Follow-Up: At [facility], you and your health needs are our priority.  As part of our continuing mission to provide you with exceptional heart care, we have created designated Provider Care Teams.  These Care Teams include your primary Cardiologist (physician) and Advanced Practice Providers (APPs -  Physician Assistants and Nurse Practitioners) who all work together to provide you with the care you need, when you need it.  We recommend signing up for the patient portal called "MyChart".  Sign up information is provided on this After Visit Summary.  MyChart is used to connect with patients for Virtual Visits (Telemedicine).  Patients are able to view lab/test results, encounter notes, upcoming appointments, etc.  Non-urgent messages can be sent to your provider as well.   To learn more about what you can do with MyChart, go to [email or web address].    Your next appointment:   We will schedule follow up for you after your pacemaker implant  Provider:   York Pellant, MD

## 2023-03-06 DIAGNOSIS — D692 Other nonthrombocytopenic purpura: Secondary | ICD-10-CM | POA: Diagnosis not present

## 2023-03-06 DIAGNOSIS — J209 Acute bronchitis, unspecified: Secondary | ICD-10-CM | POA: Diagnosis not present

## 2023-03-06 DIAGNOSIS — J020 Streptococcal pharyngitis: Secondary | ICD-10-CM | POA: Diagnosis not present

## 2023-03-06 DIAGNOSIS — J029 Acute pharyngitis, unspecified: Secondary | ICD-10-CM | POA: Diagnosis not present

## 2023-03-06 DIAGNOSIS — Z299 Encounter for prophylactic measures, unspecified: Secondary | ICD-10-CM | POA: Diagnosis not present

## 2023-03-06 DIAGNOSIS — J440 Chronic obstructive pulmonary disease with acute lower respiratory infection: Secondary | ICD-10-CM | POA: Diagnosis not present

## 2023-03-07 DIAGNOSIS — R0602 Shortness of breath: Secondary | ICD-10-CM | POA: Diagnosis not present

## 2023-03-07 DIAGNOSIS — I5020 Unspecified systolic (congestive) heart failure: Secondary | ICD-10-CM | POA: Diagnosis not present

## 2023-03-16 DIAGNOSIS — I5020 Unspecified systolic (congestive) heart failure: Secondary | ICD-10-CM | POA: Diagnosis not present

## 2023-03-16 DIAGNOSIS — R0602 Shortness of breath: Secondary | ICD-10-CM | POA: Diagnosis not present

## 2023-03-17 DIAGNOSIS — I4819 Other persistent atrial fibrillation: Secondary | ICD-10-CM | POA: Diagnosis not present

## 2023-03-18 LAB — CBC
Hematocrit: 37 % (ref 34.0–46.6)
Hemoglobin: 12 g/dL (ref 11.1–15.9)
MCH: 33.2 pg — ABNORMAL HIGH (ref 26.6–33.0)
MCHC: 32.4 g/dL (ref 31.5–35.7)
MCV: 103 fL — ABNORMAL HIGH (ref 79–97)
Platelets: 200 10*3/uL (ref 150–450)
RBC: 3.61 x10E6/uL — ABNORMAL LOW (ref 3.77–5.28)
RDW: 13.6 % (ref 11.7–15.4)
WBC: 11.3 10*3/uL — ABNORMAL HIGH (ref 3.4–10.8)

## 2023-03-18 LAB — BASIC METABOLIC PANEL
BUN/Creatinine Ratio: 26 (ref 12–28)
BUN: 41 mg/dL — ABNORMAL HIGH (ref 8–27)
CO2: 32 mmol/L — ABNORMAL HIGH (ref 20–29)
Calcium: 8.9 mg/dL (ref 8.7–10.3)
Chloride: 92 mmol/L — ABNORMAL LOW (ref 96–106)
Creatinine, Ser: 1.59 mg/dL — ABNORMAL HIGH (ref 0.57–1.00)
Glucose: 98 mg/dL (ref 70–99)
Potassium: 4.5 mmol/L (ref 3.5–5.2)
Sodium: 142 mmol/L (ref 134–144)
eGFR: 32 mL/min/{1.73_m2} — ABNORMAL LOW (ref >59)

## 2023-03-26 DIAGNOSIS — L723 Sebaceous cyst: Secondary | ICD-10-CM | POA: Diagnosis not present

## 2023-03-26 DIAGNOSIS — K5900 Constipation, unspecified: Secondary | ICD-10-CM | POA: Diagnosis not present

## 2023-03-26 DIAGNOSIS — Z299 Encounter for prophylactic measures, unspecified: Secondary | ICD-10-CM | POA: Diagnosis not present

## 2023-03-26 DIAGNOSIS — I5032 Chronic diastolic (congestive) heart failure: Secondary | ICD-10-CM | POA: Diagnosis not present

## 2023-03-26 DIAGNOSIS — I10 Essential (primary) hypertension: Secondary | ICD-10-CM | POA: Diagnosis not present

## 2023-03-28 ENCOUNTER — Ambulatory Visit: Payer: Medicare PPO | Admitting: Cardiovascular Disease

## 2023-04-01 DIAGNOSIS — D631 Anemia in chronic kidney disease: Secondary | ICD-10-CM | POA: Diagnosis not present

## 2023-04-01 DIAGNOSIS — N189 Chronic kidney disease, unspecified: Secondary | ICD-10-CM | POA: Diagnosis not present

## 2023-04-03 ENCOUNTER — Other Ambulatory Visit: Payer: Self-pay

## 2023-04-03 ENCOUNTER — Ambulatory Visit (HOSPITAL_COMMUNITY)
Admission: RE | Disposition: A | Discharge status: Home / Self Care | Payer: Self-pay | Source: Home / Self Care | Attending: Cardiovascular Disease

## 2023-04-03 ENCOUNTER — Ambulatory Visit (HOSPITAL_COMMUNITY)
Admission: RE | Admit: 2023-04-03 | Discharge: 2023-04-03 | Disposition: A | Discharge status: Home / Self Care | Payer: Medicare PPO | Attending: Cardiovascular Disease | Admitting: Cardiovascular Disease

## 2023-04-03 ENCOUNTER — Ambulatory Visit (HOSPITAL_COMMUNITY): Payer: Medicare PPO

## 2023-04-03 DIAGNOSIS — R079 Chest pain, unspecified: Secondary | ICD-10-CM

## 2023-04-03 DIAGNOSIS — I495 Sick sinus syndrome: Secondary | ICD-10-CM | POA: Diagnosis not present

## 2023-04-03 DIAGNOSIS — I5032 Chronic diastolic (congestive) heart failure: Secondary | ICD-10-CM | POA: Insufficient documentation

## 2023-04-03 DIAGNOSIS — Z79899 Other long term (current) drug therapy: Secondary | ICD-10-CM | POA: Insufficient documentation

## 2023-04-03 DIAGNOSIS — J9611 Chronic respiratory failure with hypoxia: Secondary | ICD-10-CM

## 2023-04-03 DIAGNOSIS — R600 Localized edema: Secondary | ICD-10-CM

## 2023-04-03 DIAGNOSIS — N184 Chronic kidney disease, stage 4 (severe): Secondary | ICD-10-CM | POA: Insufficient documentation

## 2023-04-03 DIAGNOSIS — Z951 Presence of aortocoronary bypass graft: Secondary | ICD-10-CM | POA: Insufficient documentation

## 2023-04-03 DIAGNOSIS — I350 Nonrheumatic aortic (valve) stenosis: Secondary | ICD-10-CM | POA: Diagnosis not present

## 2023-04-03 DIAGNOSIS — I517 Cardiomegaly: Secondary | ICD-10-CM | POA: Diagnosis not present

## 2023-04-03 DIAGNOSIS — Z953 Presence of xenogenic heart valve: Secondary | ICD-10-CM | POA: Diagnosis not present

## 2023-04-03 DIAGNOSIS — I359 Nonrheumatic aortic valve disorder, unspecified: Secondary | ICD-10-CM

## 2023-04-03 DIAGNOSIS — R001 Bradycardia, unspecified: Secondary | ICD-10-CM

## 2023-04-03 DIAGNOSIS — I2510 Atherosclerotic heart disease of native coronary artery without angina pectoris: Secondary | ICD-10-CM | POA: Insufficient documentation

## 2023-04-03 DIAGNOSIS — J449 Chronic obstructive pulmonary disease, unspecified: Secondary | ICD-10-CM | POA: Diagnosis not present

## 2023-04-03 DIAGNOSIS — Z950 Presence of cardiac pacemaker: Secondary | ICD-10-CM | POA: Diagnosis not present

## 2023-04-03 DIAGNOSIS — I4819 Other persistent atrial fibrillation: Secondary | ICD-10-CM | POA: Insufficient documentation

## 2023-04-03 DIAGNOSIS — I10 Essential (primary) hypertension: Secondary | ICD-10-CM

## 2023-04-03 HISTORY — PX: PACEMAKER IMPLANT: EP1218

## 2023-04-03 LAB — BASIC METABOLIC PANEL
Anion gap: 16 — ABNORMAL HIGH (ref 5–15)
BUN: 40 mg/dL — ABNORMAL HIGH (ref 8–23)
CO2: 41 mmol/L — ABNORMAL HIGH (ref 22–32)
Calcium: 10 mg/dL (ref 8.9–10.3)
Chloride: 83 mmol/L — ABNORMAL LOW (ref 98–111)
Creatinine, Ser: 1.68 mg/dL — ABNORMAL HIGH (ref 0.44–1.00)
GFR, Estimated: 30 mL/min — ABNORMAL LOW (ref >60)
Glucose, Bld: 125 mg/dL — ABNORMAL HIGH (ref 70–99)
Potassium: 3.2 mmol/L — ABNORMAL LOW (ref 3.5–5.1)
Sodium: 140 mmol/L (ref 135–145)

## 2023-04-03 SURGERY — PACEMAKER IMPLANT

## 2023-04-03 MED ORDER — CEFAZOLIN SODIUM-DEXTROSE 2-4 GM/100ML-% IV SOLN
2.0000 g | INTRAVENOUS | Status: AC
  Administered 2023-04-03: 2 g via INTRAVENOUS

## 2023-04-03 MED ORDER — FENTANYL CITRATE (PF) 100 MCG/2ML IJ SOLN
INTRAMUSCULAR | Status: DC | PRN
  Administered 2023-04-03 (×3): 25 ug via INTRAVENOUS

## 2023-04-03 MED ORDER — MIDAZOLAM HCL 5 MG/5ML IJ SOLN
INTRAMUSCULAR | Status: DC | PRN
  Administered 2023-04-03 (×3): 1 mg via INTRAVENOUS

## 2023-04-03 MED ORDER — FLUMAZENIL 1 MG/10ML IV SOLN
INTRAVENOUS | Status: AC
  Filled 2023-04-03: qty 10

## 2023-04-03 MED ORDER — CEFAZOLIN SODIUM-DEXTROSE 1-4 GM/50ML-% IV SOLN: INTRAVENOUS | Status: DC | PRN

## 2023-04-03 MED ORDER — ONDANSETRON HCL 4 MG/2ML IJ SOLN: 4.0000 mg | Freq: Four times a day (QID) | INTRAMUSCULAR | Status: DC | PRN

## 2023-04-03 MED ORDER — FENTANYL CITRATE (PF) 100 MCG/2ML IJ SOLN
INTRAMUSCULAR | Status: AC
  Filled 2023-04-03: qty 2

## 2023-04-03 MED ORDER — MIDAZOLAM HCL 5 MG/5ML IJ SOLN
INTRAMUSCULAR | Status: AC
  Filled 2023-04-03: qty 5

## 2023-04-03 MED ORDER — SODIUM CHLORIDE 0.9 % IV SOLN
80.0000 mg | INTRAVENOUS | Status: AC
  Administered 2023-04-03: 80 mg

## 2023-04-03 MED ORDER — LIDOCAINE HCL 1 % IJ SOLN
INTRAMUSCULAR | Status: AC
  Filled 2023-04-03: qty 20

## 2023-04-03 MED ORDER — ACETAMINOPHEN 325 MG PO TABS: 325.0000 mg | ORAL_TABLET | ORAL | Status: DC | PRN

## 2023-04-03 MED ORDER — SODIUM CHLORIDE 0.9 % IV SOLN
INTRAVENOUS | Status: AC
  Filled 2023-04-03: qty 2

## 2023-04-03 MED ORDER — METOPROLOL SUCCINATE ER 50 MG PO TB24: 50.0000 mg | ORAL_TABLET | Freq: Every day | ORAL | 11 refills | Status: DC

## 2023-04-03 MED ORDER — CEFAZOLIN SODIUM-DEXTROSE 2-4 GM/100ML-% IV SOLN
INTRAVENOUS | Status: AC
  Filled 2023-04-03: qty 100

## 2023-04-03 MED ORDER — POVIDONE-IODINE 10 % EX SWAB
2.0000 | Freq: Once | CUTANEOUS | Status: AC
  Administered 2023-04-03: 2 via TOPICAL

## 2023-04-03 MED ORDER — SODIUM CHLORIDE 0.9 % IV SOLN: INTRAVENOUS | Status: DC

## 2023-04-03 MED ORDER — LIDOCAINE HCL (PF) 1 % IJ SOLN
INTRAMUSCULAR | Status: DC | PRN
  Administered 2023-04-03: 60 mL

## 2023-04-03 SURGICAL SUPPLY — 13 items
CABLE SURGICAL S-101-97-12 (CABLE) ×2 IMPLANT
CATH RIGHTSITE C315HIS02 (CATHETERS) IMPLANT
IPG PACE AZUR XT DR MRI W1DR01 (Pacemaker) IMPLANT
KIT MICROPUNCTURE NIT STIFF (SHEATH) IMPLANT
LEAD CAPSURE NOVUS 5076-52CM (Lead) IMPLANT
LEAD SELECT SECURE 3830 383069 (Lead) IMPLANT
PACE AZURE XT DR MRI W1DR01 (Pacemaker) ×1 IMPLANT
PAD DEFIB RADIO PHYSIO CONN (PAD) ×2 IMPLANT
SELECT SECURE 3830 383069 (Lead) ×1 IMPLANT
SHEATH 7FR PRELUDE SNAP 13 (SHEATH) IMPLANT
SLITTER 6232ADJ (MISCELLANEOUS) IMPLANT
TRAY PACEMAKER INSERTION (PACKS) ×2 IMPLANT
WIRE HI TORQ VERSACORE-J 145CM (WIRE) IMPLANT

## 2023-04-03 NOTE — H&P (Signed)
Electrophysiology Office Note:    Date:  04/03/2023   ID:  [name], DOB 1940-07-09, MRN [id number]  PCP:  Ignatius Specking, MD   [facility] Providers Cardiologist:  [name], MD Electrophysiologist:  Maurice Small, MD     Referring MD: No ref. provider found   History of Present Illness:    [name] is a 82 y.o. female with a medical history significant for persistent atrial fibrillation, coronary disease status post CABG in 2009, aortic stenosis status post bioprosthetic AVR, chronic CHFpEF, COPD, CKD 4, referred for arrhythmia management.     She has a significant cardiac history with two-vessel bypass and bioprosthetic AVR in 2009.  The most recent echo in 2024 showed normal valve function with trivial regurgitation and normal ejection fraction.  She was admitted to [facility] in January 2024 with shortness of breath despite supplemental oxygen and home.  She was transferred to [facility] due to elevated troponin and concern for NSTEMI.  She was diuresed for acute CHFpEF with an EF of 65-70% and grade 2 diastolic dysfunction.  Metoprolol was stopped due to bradycardia with heart rate in the 30s during the admission.  A monitor was placed in January.  Results are detailed below.  Of note she had symptomatic bradycardia with heart rates in the 40s.  This occurred in the middle of the night upon awakening to go to the bathroom.  She was hospitalized again at [facility] in July 2024 with recurrence of shortness of breath.  Hypoxic/hypercarbic respiratory failure was treated with BiPAP, steroids, nebulizer, and diuretics.  She was in atrial fibrillation with slow ventricular rates.  She was discharged on metoprolol 12.5.  A ZIO monitor was placed with results detailed below.  In brief, this showed frequent episodes of atrial fibrillation and likely atypical flutter with poorly controlled rates.  It also showed asymptomatic pauses of up to 7.4 seconds which  appeared to be prolonged sinus node refractoriness after conversion from atrial fibrillation.     Today, she reports that she is at baseline.  She presents for placement of pacemaker.  EKGs/Labs/Other Studies Reviewed Today:    Echocardiogram:  TTE July 05, 2022 Ejection fraction 65 to 70%.  No wall motion abnormalities.  Moderate concentric LVH.  Indeterminate diastolic filling.  Bioprosthetic aortic valve is functioning normally with only trivial regurgitation. Left atrial diameter 4.7 cm   Monitors:  Zio patch 14 days January 2024 - my interpretation Sinus rhythm 33 to 123 bpm, average 64. 1.9% PACs, 3.4% PVCs There were multiple symptom triggered events typically associated with sinus rhythm.  At least one episode of shortness of breath occurred at about 12 AM when the patient got up to go to the bathroom and correlated with sinus and junctional rhythm with a rate of 43 bpm. There were 2 pauses 1 3 seconds, the other 3.8 seconds.  Neither correlated with symptoms.  1 was at 2 AM, the other at 2:53 PM and was preceded by a brief run of wide-complex tachycardia.  Zio monitor July 2024 - my interpretation Sinus rhythm 4216 bpm, average 65 Faxed strips are unintelligible.  There are multiple runs labile atrial flutter ?82% burden of atrial fibrillation/flutter, AVG rate 93 bpm There were multiple pauses, the longest 7.4 seconds.  None was associated with the patient triggered event.  These appear to be postconversion pauses.  Stress testing:   Advanced imaging:   Cardiac catherization   EKG:  Physical Exam:    VS:  BP (!) 151/68   Pulse 77   Temp 98 F (36.7 C)   Ht 5\' 3"  (1.6 m)   Wt 103 kg   LMP  (LMP Unknown)   SpO2 96%   BMI 40.21 kg/m     Wt Readings from Last 3 Encounters:  04/03/23 103 kg  02/20/23 104.3 kg  02/06/23 100.3 kg     GEN:  Well nourished, well developed in no acute distress CARDIAC: brady RR, no murmurs, rubs,  gallops RESPIRATORY:  Normal work of breathing MUSCULOSKELETAL: trace edema    ASSESSMENT & PLAN:    Atrial fibrillation with RVR Paroxysmal Symptomatic with high heart rates Medical therapy is limited by sinus bradycardia She is not a good candidate for ablation  Bradycardia; tachybradycardia syndrome She has had both sinus node dysfunction resulting in symptoms of shortness of breath while awake as well as prolonged post-conversion pauses of over 7 seconds documented Additionally, current rate control is inadequate and limited by bradycardia  I discussed the indication for the procedure and the logistics, risks, potential benefit, and after care. I specifically explained that risks include but are not limited to infection, bleeding,damage to blood vessels, lung, and the heart -- but risk of prolonged hospitalization, need for surgery, or the event of stroke, heart attack, or death are low but not zero. After discussion, we made the joint decision to proceed with device placement.  Chronic heart failure with preserved ejection fraction Currently on torsemide and metolazone.  Aortic stenosis status post bioprosthetic aortic valve replacement Continue follow-up in general cardiology clinic  CKD stage IV Limits potential rhythm control options for atrial fibrillation  COPD Has significant shortness of breath at times without overt volume overload Was admitted with hypoxic/hypercarbic respiratory failure    Signed, Maurice Small, MD  04/03/2023 1:10 PM    [facility]

## 2023-04-03 NOTE — Discharge Instructions (Signed)
After Your Pacemaker   You have a [employer] Pacemaker  ACTIVITY Do not lift your arm above shoulder height for 1 week after your procedure. After 7 days, you may progress as below.  You should remove your sling 24 hours after your procedure, unless otherwise instructed by your provider.     Thursday April 10, 2023  Friday April 11, 2023 Saturday April 12, 2023 Sunday April 13, 2023   Do not lift, push, pull, or carry anything over 10 pounds with the affected arm until 6 weeks (Thursday May 15, 2023 ) after your procedure.   You may drive AFTER your wound check, unless you have been told otherwise by your provider.   Ask your healthcare provider when you can go back to work   INCISION/Dressing If you are on a blood thinner such as Coumadin, Xarelto, Eliquis, Plavix, or Pradaxa please confirm with your provider when this should be resumed.  If large square, outer bandage is left in place, this can be removed after 24 hours from your procedure. Do not remove steri-strips or glue as below.   If a PRESSURE DRESSING (a bulky dressing that usually goes up over your shoulder) was applied or left in place, please follow instructions given by your provider on when to return to have this removed.   Monitor your Pacemaker site for redness, swelling, and drainage. Call the device clinic at [phone] if you experience these symptoms or fever/chills.  If your incision is sealed with Steri-strips or staples, you may shower 7 days after your procedure or when told by your provider. Do not remove the steri-strips or let the shower hit directly on your site. You may wash around your site with soap and water.    If you were discharged in a sling, please do not wear this during the day more than 48 hours after your surgery unless otherwise instructed. This may increase the risk of stiffness and soreness in your shoulder.   Avoid lotions, ointments, or perfumes over your incision until it  is well-healed.  You may use a hot tub or a pool AFTER your wound check appointment if the incision is completely closed.  Pacemaker Alerts:  Some alerts are vibratory and others beep. These are NOT emergencies. Please call our office to let us know. If this occurs at night or on weekends, it can wait until the next business day. Send a remote transmission.  If your device is capable of reading fluid status (for heart failure), you will be offered monthly monitoring to review this with you.   DEVICE MANAGEMENT Remote monitoring is used to monitor your pacemaker from home. This monitoring is scheduled every 91 days by our office. It allows [place] to keep an eye on the functioning of your device to ensure it is working properly. You will routinely see your Electrophysiologist annually (more often if necessary).   You should receive your ID card for your new device in 4-8 weeks. Keep this card with you at all times once received. Consider wearing a medical alert bracelet or necklace.  Your Pacemaker may be MRI compatible. This will be discussed at your next office visit/wound check.  You should avoid contact with strong electric or magnetic fields.   Do not use amateur (ham) radio equipment or electric (arc) welding torches. MP3 player headphones with magnets should not be used. Some devices are safe to use if held at least 12 inches (30 cm) from your Pacemaker. These include power tools, lawn mowers,  and speakers. If you are unsure if something is safe to use, ask your health care provider.  When using your cell phone, hold it to the ear that is on the opposite side from the Pacemaker. Do not leave your cell phone in a pocket over the Pacemaker.  You may safely use electric blankets, heating pads, computers, and microwave ovens.  Call the office right away if: You have chest pain. You feel more short of breath than you have felt before. You feel more light-headed than you have felt before. Your  incision starts to open up.  This information is not intended to replace advice given to you by your health care provider. Make sure you discuss any questions you have with your health care provider.

## 2023-04-04 ENCOUNTER — Encounter (HOSPITAL_COMMUNITY): Payer: Self-pay | Admitting: Cardiovascular Disease

## 2023-04-04 MED FILL — Lidocaine HCl Local Inj 1%: INTRAMUSCULAR | Qty: 60 | Status: AC

## 2023-04-06 DIAGNOSIS — I5020 Unspecified systolic (congestive) heart failure: Secondary | ICD-10-CM | POA: Diagnosis not present

## 2023-04-06 DIAGNOSIS — R0602 Shortness of breath: Secondary | ICD-10-CM | POA: Diagnosis not present

## 2023-04-10 DIAGNOSIS — N184 Chronic kidney disease, stage 4 (severe): Secondary | ICD-10-CM | POA: Diagnosis not present

## 2023-04-10 DIAGNOSIS — E873 Alkalosis: Secondary | ICD-10-CM | POA: Diagnosis not present

## 2023-04-10 DIAGNOSIS — I129 Hypertensive chronic kidney disease with stage 1 through stage 4 chronic kidney disease, or unspecified chronic kidney disease: Secondary | ICD-10-CM | POA: Diagnosis not present

## 2023-04-10 DIAGNOSIS — E876 Hypokalemia: Secondary | ICD-10-CM | POA: Diagnosis not present

## 2023-04-14 ENCOUNTER — Ambulatory Visit: Payer: Medicare PPO | Attending: Nurse Practitioner | Admitting: Nurse Practitioner

## 2023-04-14 ENCOUNTER — Encounter: Payer: Self-pay | Admitting: Nurse Practitioner

## 2023-04-14 VITALS — BP 130/80 | HR 52 | Ht 63.0 in | Wt 235.2 lb

## 2023-04-14 DIAGNOSIS — I2510 Atherosclerotic heart disease of native coronary artery without angina pectoris: Secondary | ICD-10-CM | POA: Diagnosis not present

## 2023-04-14 DIAGNOSIS — N184 Chronic kidney disease, stage 4 (severe): Secondary | ICD-10-CM

## 2023-04-14 DIAGNOSIS — R0609 Other forms of dyspnea: Secondary | ICD-10-CM

## 2023-04-14 DIAGNOSIS — I5032 Chronic diastolic (congestive) heart failure: Secondary | ICD-10-CM | POA: Diagnosis not present

## 2023-04-14 DIAGNOSIS — I495 Sick sinus syndrome: Secondary | ICD-10-CM

## 2023-04-14 DIAGNOSIS — I4819 Other persistent atrial fibrillation: Secondary | ICD-10-CM | POA: Diagnosis not present

## 2023-04-14 DIAGNOSIS — Z950 Presence of cardiac pacemaker: Secondary | ICD-10-CM

## 2023-04-14 DIAGNOSIS — I10 Essential (primary) hypertension: Secondary | ICD-10-CM

## 2023-04-14 DIAGNOSIS — Z953 Presence of xenogenic heart valve: Secondary | ICD-10-CM

## 2023-04-14 DIAGNOSIS — E785 Hyperlipidemia, unspecified: Secondary | ICD-10-CM

## 2023-04-14 DIAGNOSIS — J449 Chronic obstructive pulmonary disease, unspecified: Secondary | ICD-10-CM

## 2023-04-14 MED ORDER — SPIRONOLACTONE 25 MG PO TABS
12.5000 mg | ORAL_TABLET | Freq: Every day | ORAL | 1 refills | Status: DC
Start: 2023-04-14 — End: 2023-04-14

## 2023-04-14 MED ORDER — POTASSIUM CHLORIDE CRYS ER 20 MEQ PO TBCR: 20.0000 meq | EXTENDED_RELEASE_TABLET | Freq: Every day | ORAL | 6 refills | Status: DC

## 2023-04-14 NOTE — Progress Notes (Unsigned)
Cardiology Office Note:   Date: 02/06/2023 ID:  [name], DOB Dec 03, 1940, MRN [id number] PCP:  Ignatius Specking, MD [facility] Providers Cardiologist:  [name], MD Electrophysiologist:  Maurice Small, MD   Referring MD: Ignatius Specking, MD  CC: Here for 1 month follow-up for HF History of Present Illness:    [name] is a 82 y.o. female with a PMH of CAD, s/p CABG in 2009, HFpEF, AS, s/p bioprosthetic aortic valve replacement, persistent A-fib, HTN, HLD, COPD, and CKD stage IV, who presents today for 1 month follow-up.   Has a remote history of CABG x 2 with SVG-RCA and LIMA-LAD as well as AVR in 2009.  NST in 2019 was negative for ischemia.  Echocardiogram in June 2023 showed normal EF, with dynamic LVOT and moderate symmetrical LVH.  Aortic valve was stable with normal function and mild stenosis/regurgitation.  She presented to [facility] in January 2024 with shortness of breath despite home use of O2 at 2 to 3 L and had associated chest pressure.  EKG was negative. Troponins were elevated with peak at 2,302. CXR revealed cardiomegaly with mild pulmonary vascular congestion mild by basilar and mid right lung atelectasis.  Noted increasing DOE over the past month. Transferred to [facility] due to concern for NSTEMI.  Diuresed in the ED.  TTE revealed EF 65 to 70%, grade 2 DD, stable aortic valve. Therapy recommended home health PT/OT. Metoprolol was stopped due to patient being bradycardic with heart rate in 30s.   Hospital admission 12/2022 at [facility] for shortness of breath and acute on chronic hypoxic/hypercarbic respiratory failure, placed on BiPAP, diuresed and tx with steroids and nebulizer. EKG showed A-fib with slow ventricular rate in 40's, however was d/c on Toprol 12.5 mg daily. Trop were slightly elevated secondary to chronic heart failure. TTE was updated and full report noted below.  Last seen by Dr. Diona Browner on January 15, 2023.  She was doing well at that time and  was diuresed well.  Was still using supplemental oxygen. Was instructed to take Metolazone 2.5 mg weekly, then as needed per discharge instructions.   Today she presents for follow-up. Says she turned in her Zio monitor about 2 weeks ago. Overall doing well. Weights are stable per her report. SBP's averaging 140's -150's at home. Denies any chest pain, worsening shortness of breath, palpitations, syncope, presyncope, dizziness, orthopnea, PND, significant weight changes, acute bleeding, or claudication.    Please see the history of present illness.    All other systems reviewed and are negative.  04/14/2023:   Took extra Torsemide recently.    Metolazone tomorrow morning one before torsemide,  Extra Torsemide 4 tablets in AM and 4 in PM tomorrow (10/22) and 10/23 same torsemide regimen then back down to regular torsemide. Continue your extra potassium as you've been doing. Tomorrow  - 10/22 take 2 potassium tablets. 10/23 Take 2 potassium tablets.   [address] labs - BMET, proBNP, and Mag. 10/24 Start Spironolactone.    EKGs/Labs/Other Studies Reviewed:    The following studies were reviewed today:   EKG:  EKG is not ordered today.    Echo 12/2022 ([facility]): Summary 1. The left ventricle is normal in size with mildly to moderately increased wall thickness. 2. The left ventricular systolic function is normal, LVEF is visually estimated at 65-70%. 3. There is grade II diastolic dysfunction (elevated filling pressure). 4. The mitral valve leaflets are mildly thickened with normal leaflet mobility. 5. Mitral  annular calcification is present. 6. Aortic valve replacement (21 mm bioprosthetic, implantation date: 2009). 7. Aortic valve Doppler indices are consistent with normal prosthetic valve function. 8. The left atrium is moderately dilated in size. 9. The right ventricle is mildly dilated in size, with normal systolic function. 10. There is mild to moderate tricuspid regurgitation. 11. There  is mild-moderate pulmonary hypertension. 12. The right atrium is mildly dilated in size. 13. IVC size and inspiratory change suggest mildly elevated right atrial pressure. (5-10 mmHg). Left Ventricle The left ventricle is normal in size with mildly to moderately increased wall thickness. The left ventricular systolic function is normal, LVEF is visually estimated at 65-70%. There is grade II diastolic dysfunction (elevated filling pressure). Right Ventricle The right ventricle is mildly dilated in size, with normal systolic function. Left Atrium The left atrium is moderately dilated in size. Right Atrium The right atrium is mildly dilated in size. Aortic Valve Aortic valve replacement (21 mm bioprosthetic, implantation date: 2009). The prosthetic aortic valve is well seated. Peak AV transvalvular velocity: 2.0 m/s. Mean gradient: 10 mmHg. Doppler velocity index: 0.47. LVOT diameter: 1.9 cm. LVOT stroke volume index: 33 ml/m2. AV estimated effective orifice area (by continuity equation): 1.5 cm2. There is trace regurgitation of the prosthetic aortic valve. Aortic valve Doppler indices are consistent with normal prosthetic valve function. AV Doppler velocity ratio (dimensionless index): 0.47. Mitral Valve The mitral valve leaflets are mildly thickened with normal leaflet mobility. Mitral annular calcification is present. There is trivial mitral valve regurgitation. Tricuspid Valve The tricuspid valve leaflets are normal, with normal leaflet mobility. There is mild to moderate tricuspid regurgitation. There is no tricuspid valve stenosis. There is mild-moderate pulmonary hypertension. TR maximum velocity: 3.2 m/s Estimated PASP: 48 mmHg. Pulmonic Valve The pulmonic valve is normal. There is mild pulmonic regurgitation. There is no evidence of a significant transvalvular gradient. Aorta The aorta is normal in size in the visualized segments. Inferior Vena Cava IVC size and inspiratory change suggest mildly elevated  right atrial pressure. (5-10 mmHg). Pericardium/Pleural There is no pericardial effusion.  Cardiac monitor on 07/31/2022: Predominant rhythm is sinus with IVCD.  Heart rate ranged from 33 bpm up to 123 bpm and average heart rate 64 bpm. Occasional PACs were noted representing 1.9% total beats with otherwise rare atrial couplets and triplets. Occasional PVCs representing 3.4% total beats were noted with otherwise rare ventricular couplets and triplets.  Also limited episodes of ventricular bigeminy and trigeminy. There were two brief episodes of NSVT noted, the longest of which lasted 6 beats. There were five episodes of PSVT noted, the longest of which lasted 20 beats. There were two pauses noted, the longest of which lasted 3.8 seconds (2:03 AM on February 1).  The second pause was 3 seconds in duration (2:53 PM on January 20).  Neither of these were patient triggered events.  Also intermittently noted junctional rhythm and possible idioventricular rhythm observed in the setting of bradycardia.   Limited Echo 07/05/2022:  1. Left ventricular ejection fraction, by estimation, is 65 to 70%. The  left ventricle has normal function. The left ventricle has no regional  wall motion abnormalities. There is moderate concentric left ventricular  hypertrophy. Indeterminate diastolic  filling due to E-A fusion.   2. Right ventricular systolic function was not well visualized. The right  ventricular size is not well visualized. There is normal pulmonary artery  systolic pressure. The estimated right ventricular systolic pressure is  12.1 mmHg.   3. The mitral valve  is degenerative. Trivial mitral valve regurgitation.  No evidence of mitral stenosis.   4. 21 mm Magna pericardial bioprosthetic aortic valve. Vmax 2.5 m/s, MG  14 mmHg, EOA 1.96 cm2, DI 0.62. Trivial regurgitation noted. The aortic  valve has been repaired/replaced. Aortic valve regurgitation is trivial.  Procedure Date: 12/10/2007.   5. The  inferior vena cava is normal in size with greater than 50%  respiratory variability, suggesting right atrial pressure of 3 mmHg.   Comparison(s): No significant change from prior study.   Lexiscan on 12/23/2017: There was no ST segment deviation noted during stress. Sinus rhythm with right bundle branch block and frequent PACs seen throughout study. Defect 1: There is a defect present in the mid anterior and apical anterior location. Images appear to be improved with stress consistent with reversed redistribution. There are no significant ischemic territories. This is a low risk study. Nuclear stress EF: 48%.  Risk Assessment/Calculations:    CHA2DS2-VASc Score = 6  This indicates a 9.7% annual risk of stroke. The patient's score is based upon: CHF History: 1 HTN History: 1 Diabetes History: 0 Stroke History: 0 Vascular Disease History: 1 Age Score: 2 Gender Score: 1   Physical Exam:    VS:  LMP  (LMP Unknown)     Wt Readings from Last 3 Encounters:  04/03/23 227 lb (103 kg)  02/20/23 230 lb (104.3 kg)  02/06/23 221 lb 3.2 oz (100.3 kg)   GEN: Obese, 82 y.o. female in no acute distress, wearing chronic O2 HEENT: Normal NECK: No JVD; No carotid bruits CARDIAC: S1/S2, RRR, Grade 2/6 murmur, no rubs, no gallops; 2+ pulses RESPIRATORY:  Diminished crackles to bilateral posterior lung bases, overall diminished, without wheezing or rhonchi  MUSCULOSKELETAL:  Nonpitting edema (R>L); No deformity  SKIN: Warm and dry NEUROLOGIC:  Alert and oriented x 3 PSYCHIATRIC:  Normal affect   ASSESSMENT & PLAN:    In order of problems listed above:  Chronic HFpEF Stage C, NYHA class II-III symptoms. Weight is down since last visit. Admits to chronic leg swelling, R>L lower extremity.  Continue Jardiance, Torsemide, Toprol XL (tolerating low dose well - HR well controlled), potassium supplementation, and metolazone as scheduled. GDMT limited with current kidney disease. Low sodium diet,  fluid restriction <2L, and daily weights encouraged. Educated to contact our office for weight gain of 2 lbs overnight or 5 lbs in one week. Will refill metolazone per her request.   CAD, s/p CABG in 2009 Stable with no anginal symptoms. No indication for ischemic evaluation. Continue current medication regimen. Heart healthy diet and regular cardiovascular exercise as tolerated encouraged.   Persistent A-fib, hx of bradycardia/pauses/arrhythmia Denies any tachycardia or palpitations. Most recent Zio monitor is pending. Previous cardiac monitor findings as mentioned above. Continues to be asymptomatic. Appears pt was started on Toprol XL 12.5 mg daily at [facility], tolerating well. Will continue to monitor. Continue Eliquis 2.5 mg BID, on appropriate dosage. Heart healthy diet and regular cardiovascular exercise as tolerated encouraged.   AS, s/p bioprosthetic AVR Echo 12/2022 revealed 21 mm magna pericardial bioprosthetic aortic valve with overall normal valvular function.  Continue current medication regimen. SBE prophylaxis discussed. Heart healthy diet and regular cardiovascular exercise as tolerated encouraged.   HTN Blood pressure on recheck stable.  Discussed SBP goal < 140 and discussed to monitor BP at home at least 2 hours after medications and sitting for 5-10 minutes. Given BP log. Continue current medication regimen.  Low salt, heart healthy diet recommended.  HLD Past lipid panel revealed LDL 85. Continue atorvastatin and fenofibrate. Heart healthy diet and regular cardiovascular exercise as tolerated encouraged.   COPD, lung crackles, DOE Pt admits to DOE, stable. Recent CXR at [facility] negative for volume overload or infiltrates. Continue current medication regimen. Continue to f/u with Pulmonology and PCP. Continue oxygen therapy.   CKD stage IV Recent sCr was stable. Avoid nephrotoxic agents. Continue to follow-up with Nephrology.   Disposition: Follow-up with me or APP in 2 months  or sooner if anything changes.    Medication Adjustments/Labs and Tests Ordered: Current medicines are reviewed at length with the patient today.  Concerns regarding medicines are outlined above.  No orders of the defined types were placed in this encounter.  No orders of the defined types were placed in this encounter.   There are no Patient Instructions on file for this visit.   Signed, Sharlene Dory, NP  04/14/2023 12:53 PM    [facility]

## 2023-04-14 NOTE — Patient Instructions (Addendum)
Medication Instructions:  Your physician has recommended you make the following change in your medication:  Please take one tablet of Metolazone tomorrow morning, 30 minutes before torsemide. Please take 4 tablets of Torsemide in AM and 4 tablets in PM tomorrow (10/22) and continues this same regimen for Torsemide on 10/23. On 10/22 and 10/23, take 40 mEq of potassium, then return to normal dosing.   Labwork: 1 week - [facility] ([employer], proBNP, Mag)   Testing/Procedures: None   Follow-Up: Your physician recommends that you schedule a follow-up appointment in: 4-6 weeks   Any Other Special Instructions Will Be Listed Below (If Applicable).  If you need a refill on your cardiac medications before your next appointment, please call your pharmacy.

## 2023-04-15 DIAGNOSIS — R0602 Shortness of breath: Secondary | ICD-10-CM | POA: Diagnosis not present

## 2023-04-15 DIAGNOSIS — I5020 Unspecified systolic (congestive) heart failure: Secondary | ICD-10-CM | POA: Diagnosis not present

## 2023-04-17 ENCOUNTER — Ambulatory Visit: Payer: Medicare PPO | Attending: Cardiology

## 2023-04-17 ENCOUNTER — Other Ambulatory Visit (HOSPITAL_BASED_OUTPATIENT_CLINIC_OR_DEPARTMENT_OTHER): Payer: Self-pay | Admitting: Nurse Practitioner

## 2023-04-17 DIAGNOSIS — I2510 Atherosclerotic heart disease of native coronary artery without angina pectoris: Secondary | ICD-10-CM | POA: Diagnosis not present

## 2023-04-17 DIAGNOSIS — Z79899 Other long term (current) drug therapy: Secondary | ICD-10-CM

## 2023-04-17 DIAGNOSIS — E876 Hypokalemia: Secondary | ICD-10-CM

## 2023-04-17 DIAGNOSIS — I5032 Chronic diastolic (congestive) heart failure: Secondary | ICD-10-CM

## 2023-04-17 DIAGNOSIS — I495 Sick sinus syndrome: Secondary | ICD-10-CM

## 2023-04-17 DIAGNOSIS — I4819 Other persistent atrial fibrillation: Secondary | ICD-10-CM | POA: Diagnosis not present

## 2023-04-17 DIAGNOSIS — N1832 Chronic kidney disease, stage 3b: Secondary | ICD-10-CM

## 2023-04-17 LAB — CUP PACEART INCLINIC DEVICE CHECK
Battery Remaining Longevity: 178 mo
Battery Voltage: 3.22 V
Brady Statistic AP VP Percent: 0.25 %
Brady Statistic AP VS Percent: 47.97 %
Brady Statistic AS VP Percent: 0.03 %
Brady Statistic AS VS Percent: 51.85 %
Brady Statistic RA Percent Paced: 7.78 %
Brady Statistic RV Percent Paced: 7.47 %
Date Time Interrogation Session: 20241024150353
Implantable Lead Connection Status: 753985
Implantable Lead Connection Status: 753985
Implantable Lead Implant Date: 20241010
Implantable Lead Implant Date: 20241010
Implantable Lead Location: 753859
Implantable Lead Location: 753860
Implantable Lead Model: 3830
Implantable Lead Model: 5076
Implantable Pulse Generator Implant Date: 20241010
Lead Channel Impedance Value: 380 Ohm
Lead Channel Impedance Value: 513 Ohm
Lead Channel Impedance Value: 513 Ohm
Lead Channel Impedance Value: 722 Ohm
Lead Channel Pacing Threshold Amplitude: 0.625 V
Lead Channel Pacing Threshold Amplitude: 0.75 V
Lead Channel Pacing Threshold Pulse Width: 0.4 ms
Lead Channel Pacing Threshold Pulse Width: 0.4 ms
Lead Channel Sensing Intrinsic Amplitude: 26.25 mV
Lead Channel Sensing Intrinsic Amplitude: 3.875 mV
Lead Channel Sensing Intrinsic Amplitude: 30.375 mV
Lead Channel Sensing Intrinsic Amplitude: 4.75 mV
Lead Channel Setting Pacing Amplitude: 3.5 V
Lead Channel Setting Pacing Amplitude: 3.5 V
Lead Channel Setting Pacing Pulse Width: 0.4 ms
Lead Channel Setting Sensing Sensitivity: 1.2 mV
Zone Setting Status: 755011

## 2023-04-17 NOTE — Patient Instructions (Signed)
After Your Pacemaker   Monitor your pacemaker site for redness, swelling, and drainage. Call the device clinic at [phone] if you experience these symptoms or fever/chills.  Your incision was closed with Steri-strips or staples:  You may shower 7 days after your procedure and wash your incision with soap and water. Avoid lotions, ointments, or perfumes over your incision until it is well-healed.  You may use a hot tub or a pool after your wound check appointment if the incision is completely closed.  Do not lift, push or pull greater than 10 pounds with the affected arm until 6 weeks after your procedure. UNTIL AFTER NOVEMBER 21ST. There are no other restrictions in arm movement after your wound check appointment.  You may drive, unless driving has been restricted by your healthcare providers.  Remote monitoring is used to monitor your pacemaker from home. This monitoring is scheduled every 91 days by our office. It allows [place] to keep an eye on the functioning of your device to ensure it is working properly. You will routinely see your Electrophysiologist annually (more often if necessary).

## 2023-04-17 NOTE — Progress Notes (Signed)
Wound check appointment. Steri-strips removed. Wound without redness or edema. Incision edges approximated, wound well healed. Normal device function. Thresholds, sensing, and impedances consistent with implant measurements. Device programmed at 3.5V/auto capture programmed on for extra safety margin until 3 month visit. Histogram distribution appropriate for patient and level of activity. Known afib on Eliquis. Patient educated about wound care, arm mobility, lifting restrictions. ROV in 3 months with implanting physician.

## 2023-04-18 LAB — BRAIN NATRIURETIC PEPTIDE: BNP: 47.2 pg/mL (ref 0.0–100.0)

## 2023-04-18 LAB — BASIC METABOLIC PANEL
BUN/Creatinine Ratio: 24 (ref 12–28)
BUN: 50 mg/dL — ABNORMAL HIGH (ref 8–27)
CO2: 37 mmol/L — ABNORMAL HIGH (ref 20–29)
Calcium: 9.3 mg/dL (ref 8.7–10.3)
Chloride: 87 mmol/L — ABNORMAL LOW (ref 96–106)
Creatinine, Ser: 2.06 mg/dL — ABNORMAL HIGH (ref 0.57–1.00)
Glucose: 142 mg/dL — ABNORMAL HIGH (ref 70–99)
Potassium: 3.1 mmol/L — ABNORMAL LOW (ref 3.5–5.2)
Sodium: 143 mmol/L (ref 134–144)
eGFR: 24 mL/min/{1.73_m2} — ABNORMAL LOW (ref >59)

## 2023-04-18 LAB — MAGNESIUM: Magnesium: 2.1 mg/dL (ref 1.6–2.3)

## 2023-04-23 MED ORDER — POTASSIUM CHLORIDE CRYS ER 20 MEQ PO TBCR: EXTENDED_RELEASE_TABLET | ORAL | 1 refills | Status: DC

## 2023-04-23 NOTE — Addendum Note (Signed)
Addended by: Sharen Hones on: 04/23/2023 09:17 AM   Modules accepted: Orders

## 2023-05-01 ENCOUNTER — Telehealth: Payer: Self-pay | Admitting: Cardiovascular Disease

## 2023-05-01 NOTE — Telephone Encounter (Signed)
Spoke with [name] at [facility] to inquire about refills on Metoprolol she stated that she has plenty of refills and that it was already pre-packaged for her and will not be ready for another refill until 11/25. Also called patient to verify she stated she just looked in her bottle not the package advised her to look and that once the 25th comes she can get her other refill. Patient verbalized understanding.

## 2023-05-01 NOTE — Telephone Encounter (Signed)
 *  STAT* If patient is at the pharmacy, call can be transferred to refill team.   1. Which medications need to be refilled? (please list name of each medication and dose if known) metoprolol succinate (TOPROL XL) 50 MG 24 hr tablet    2. Would you like to learn more about the convenience, safety, & potential cost savings by using the [facility]?    3. Are you open to using the [facility] (Type [facility].   4. Which pharmacy/location (including street and city if local pharmacy) is medication to be sent to?  [facility] - [place], [place] - [address]        5. Do they need a 30 day or 90 day supply? 30 days   Pt is completely out of meds

## 2023-05-07 DIAGNOSIS — I5032 Chronic diastolic (congestive) heart failure: Secondary | ICD-10-CM | POA: Diagnosis not present

## 2023-05-07 DIAGNOSIS — I5020 Unspecified systolic (congestive) heart failure: Secondary | ICD-10-CM | POA: Diagnosis not present

## 2023-05-07 DIAGNOSIS — R0602 Shortness of breath: Secondary | ICD-10-CM | POA: Diagnosis not present

## 2023-05-15 ENCOUNTER — Ambulatory Visit: Payer: Medicare PPO | Attending: Nurse Practitioner | Admitting: Nurse Practitioner

## 2023-05-15 ENCOUNTER — Encounter: Payer: Self-pay | Admitting: Nurse Practitioner

## 2023-05-15 VITALS — BP 116/62 | HR 60 | Ht 63.0 in | Wt 233.0 lb

## 2023-05-15 DIAGNOSIS — R0609 Other forms of dyspnea: Secondary | ICD-10-CM

## 2023-05-15 DIAGNOSIS — Z953 Presence of xenogenic heart valve: Secondary | ICD-10-CM

## 2023-05-15 DIAGNOSIS — Z950 Presence of cardiac pacemaker: Secondary | ICD-10-CM | POA: Diagnosis not present

## 2023-05-15 DIAGNOSIS — I10 Essential (primary) hypertension: Secondary | ICD-10-CM

## 2023-05-15 DIAGNOSIS — I5032 Chronic diastolic (congestive) heart failure: Secondary | ICD-10-CM | POA: Diagnosis not present

## 2023-05-15 DIAGNOSIS — N184 Chronic kidney disease, stage 4 (severe): Secondary | ICD-10-CM

## 2023-05-15 DIAGNOSIS — E785 Hyperlipidemia, unspecified: Secondary | ICD-10-CM

## 2023-05-15 DIAGNOSIS — J449 Chronic obstructive pulmonary disease, unspecified: Secondary | ICD-10-CM

## 2023-05-15 DIAGNOSIS — I4819 Other persistent atrial fibrillation: Secondary | ICD-10-CM | POA: Diagnosis not present

## 2023-05-15 DIAGNOSIS — I2510 Atherosclerotic heart disease of native coronary artery without angina pectoris: Secondary | ICD-10-CM

## 2023-05-15 MED ORDER — SPIRONOLACTONE 25 MG PO TABS: 12.5000 mg | ORAL_TABLET | Freq: Every day | ORAL | 1 refills | Status: DC

## 2023-05-15 NOTE — Progress Notes (Signed)
Cardiology Office Note:   Date: 05/15/2023 ID:  SELAH MAURI, DOB [birth year], MRN [id number] PCP:  Ignatius Specking, MD [facility] Providers Cardiologist:  [name], MD Electrophysiologist:  Maurice Small, MD   Referring MD: Ignatius Specking, MD  CC: Here for scheduled follow-up History of Present Illness:    EARLEN STANDREW is a 82 y.o. female with a PMH of CAD, s/p CABG in 2009, HFpEF, AS, s/p bioprosthetic aortic valve replacement, tachybradycardia syndrome, s/p PPM, persistent A-fib, HTN, HLD, COPD, and CKD stage IV, who presents today for scheduled follow-up.   Has a remote history of CABG x 2 with SVG-RCA and LIMA-LAD as well as AVR in 2009.  NST in 2019 was negative for ischemia.  Echocardiogram in June 2023 showed normal EF, with dynamic LVOT and moderate symmetrical LVH.  Aortic valve was stable with normal function and mild stenosis/regurgitation.  She presented to [facility] in January 2024 with shortness of breath despite home use of O2 at 2 to 3 L and had associated chest pressure.  EKG was negative. Troponins were elevated with peak at 2,302. CXR revealed cardiomegaly with mild pulmonary vascular congestion mild by basilar and mid right lung atelectasis.  Noted increasing DOE over the past month. Transferred to [facility] due to concern for NSTEMI.  Diuresed in the ED.  TTE revealed EF 65 to 70%, grade 2 DD, stable aortic valve. Therapy recommended home health PT/OT. Metoprolol was stopped due to patient being bradycardic with heart rate in 30s.   Hospital admission 12/2022 at [facility] for shortness of breath and acute on chronic hypoxic/hypercarbic respiratory failure, placed on BiPAP, diuresed and tx with steroids and nebulizer. EKG showed A-fib with slow ventricular rate in 40's, however was d/c on Toprol 12.5 mg daily. Trop were slightly elevated secondary to chronic heart failure. TTE was updated and full report noted below.  Last seen by Dr. Diona Browner on January 15, 2023.  She  was doing well at that time and was diuresed well.  Was still using supplemental oxygen. Was instructed to take Metolazone 2.5 mg weekly, then as needed per discharge instructions.   Was evaluated by EP on February 20, 2023 and underwent PPM on 04/03/2023 for tachybradycardia syndrome.   Last seen for follow-up on April 14, 2023. Was doing well since her pacemaker implantation, however did note more leg edema since last visit. Had been taking extra Torsemide dosages recently to improve her edema. Recently saw Nephrologist and wanted to know if she can start Spironolactone. Denied any chest pain, worsening shortness of breath, palpitations, syncope, presyncope, dizziness, orthopnea, PND, acute bleeding, or claudication. Per our records, her weight was up about 14 lbs from last office visit.  Today she presents for follow-up. Doing better and leg swelling has improved. Denies any chest pain, worsening shortness of breath, palpitations, syncope, presyncope, dizziness, orthopnea, PND, swelling or significant weight changes, acute bleeding, or claudication. She is only taking Metolazone 2.5 mg once per week PRN for edema.   Please see the history of present illness.    All other systems reviewed and are negative.   EKGs/Labs/Other Studies Reviewed:    The following studies were reviewed today:  EKG:  Pt declined for EKG to be obtained today.   Echo 12/2022 ([facility]): Summary 1. The left ventricle is normal in size with mildly to moderately increased wall thickness. 2. The left ventricular systolic function is normal, LVEF is visually estimated at 65-70%. 3. There is grade II diastolic  dysfunction (elevated filling pressure). 4. The mitral valve leaflets are mildly thickened with normal leaflet mobility. 5. Mitral annular calcification is present. 6. Aortic valve replacement (21 mm bioprosthetic, implantation date: 2009). 7. Aortic valve Doppler indices are consistent with normal prosthetic valve function.  8. The left atrium is moderately dilated in size. 9. The right ventricle is mildly dilated in size, with normal systolic function. 10. There is mild to moderate tricuspid regurgitation. 11. There is mild-moderate pulmonary hypertension. 12. The right atrium is mildly dilated in size. 13. IVC size and inspiratory change suggest mildly elevated right atrial pressure. (5-10 mmHg). Left Ventricle The left ventricle is normal in size with mildly to moderately increased wall thickness. The left ventricular systolic function is normal, LVEF is visually estimated at 65-70%. There is grade II diastolic dysfunction (elevated filling pressure). Right Ventricle The right ventricle is mildly dilated in size, with normal systolic function. Left Atrium The left atrium is moderately dilated in size. Right Atrium The right atrium is mildly dilated in size. Aortic Valve Aortic valve replacement (21 mm bioprosthetic, implantation date: 2009). The prosthetic aortic valve is well seated. Peak AV transvalvular velocity: 2.0 m/s. Mean gradient: 10 mmHg. Doppler velocity index: 0.47. LVOT diameter: 1.9 cm. LVOT stroke volume index: 33 ml/m2. AV estimated effective orifice area (by continuity equation): 1.5 cm2. There is trace regurgitation of the prosthetic aortic valve. Aortic valve Doppler indices are consistent with normal prosthetic valve function. AV Doppler velocity ratio (dimensionless index): 0.47. Mitral Valve The mitral valve leaflets are mildly thickened with normal leaflet mobility. Mitral annular calcification is present. There is trivial mitral valve regurgitation. Tricuspid Valve The tricuspid valve leaflets are normal, with normal leaflet mobility. There is mild to moderate tricuspid regurgitation. There is no tricuspid valve stenosis. There is mild-moderate pulmonary hypertension. TR maximum velocity: 3.2 m/s Estimated PASP: 48 mmHg. Pulmonic Valve The pulmonic valve is normal. There is mild pulmonic regurgitation. There  is no evidence of a significant transvalvular gradient. Aorta The aorta is normal in size in the visualized segments. Inferior Vena Cava IVC size and inspiratory change suggest mildly elevated right atrial pressure. (5-10 mmHg). Pericardium/Pleural There is no pericardial effusion.  Cardiac monitor on 07/31/2022: Predominant rhythm is sinus with IVCD.  Heart rate ranged from 33 bpm up to 123 bpm and average heart rate 64 bpm. Occasional PACs were noted representing 1.9% total beats with otherwise rare atrial couplets and triplets. Occasional PVCs representing 3.4% total beats were noted with otherwise rare ventricular couplets and triplets.  Also limited episodes of ventricular bigeminy and trigeminy. There were two brief episodes of NSVT noted, the longest of which lasted 6 beats. There were five episodes of PSVT noted, the longest of which lasted 20 beats. There were two pauses noted, the longest of which lasted 3.8 seconds (2:03 AM on February 1).  The second pause was 3 seconds in duration (2:53 PM on January 20).  Neither of these were patient triggered events.  Also intermittently noted junctional rhythm and possible idioventricular rhythm observed in the setting of bradycardia.   Limited Echo 07/05/2022:  1. Left ventricular ejection fraction, by estimation, is 65 to 70%. The  left ventricle has normal function. The left ventricle has no regional  wall motion abnormalities. There is moderate concentric left ventricular  hypertrophy. Indeterminate diastolic  filling due to E-A fusion.   2. Right ventricular systolic function was not well visualized. The right  ventricular size is not well visualized. There is normal pulmonary artery  systolic pressure. The estimated right ventricular systolic pressure is  12.1 mmHg.   3. The mitral valve is degenerative. Trivial mitral valve regurgitation.  No evidence of mitral stenosis.   4. 21 mm Magna pericardial bioprosthetic aortic valve. Vmax 2.5 m/s,  MG  14 mmHg, EOA 1.96 cm2, DI 0.62. Trivial regurgitation noted. The aortic  valve has been repaired/replaced. Aortic valve regurgitation is trivial.  Procedure Date: 12/10/2007.   5. The inferior vena cava is normal in size with greater than 50%  respiratory variability, suggesting right atrial pressure of 3 mmHg.   Comparison(s): No significant change from prior study.   Lexiscan on 12/23/2017: There was no ST segment deviation noted during stress. Sinus rhythm with right bundle branch block and frequent PACs seen throughout study. Defect 1: There is a defect present in the mid anterior and apical anterior location. Images appear to be improved with stress consistent with reversed redistribution. There are no significant ischemic territories. This is a low risk study. Nuclear stress EF: 48%.  Risk Assessment/Calculations:    CHA2DS2-VASc Score = 6  This indicates a 9.7% annual risk of stroke. The patient's score is based upon: CHF History: 1 HTN History: 1 Diabetes History: 0 Stroke History: 0 Vascular Disease History: 1 Age Score: 2 Gender Score: 1   Physical Exam:    VS:  BP 116/62   Pulse 60   Ht 5\' 3"  (1.6 m)   Wt 233 lb (105.7 kg)   LMP  (LMP Unknown)   SpO2 96%   BMI 41.27 kg/m     Wt Readings from Last 3 Encounters:  05/15/23 233 lb (105.7 kg)  04/14/23 235 lb 3.2 oz (106.7 kg)  04/03/23 227 lb (103 kg)   GEN: Obese, 82 y.o. female in no acute distress, wearing chronic O2 HEENT: Normal NECK: No JVD; No carotid bruits CARDIAC: S1/S2, RRR, Grade 2/6 murmur, no rubs, no gallops; 2+ pulses RESPIRATORY:  Overall clear and diminished, without wheezing or rhonchi  MUSCULOSKELETAL:  No edema; No deformity  SKIN: Warm and dry NEUROLOGIC:  Alert and oriented x 3 PSYCHIATRIC:  Normal affect   ASSESSMENT & PLAN:    In order of problems listed above:  Chronic HFpEF Stage C, NYHA class II-III symptoms. Weight gain appears to be caloric, BNP was normal recently.  Recent improvement in peripheral edema. Continue current GDMT. Patient verbalized understanding. GDMT limited with current kidney disease. Low sodium diet, fluid restriction <2L, and daily weights encouraged. Educated to contact our office for weight gain of 2 lbs overnight or 5 lbs in one week. Will refill Spironolactone per her request.    CAD, s/p CABG in 2009 Stable with no anginal symptoms. No indication for ischemic evaluation. Continue current medication regimen. Heart healthy diet and regular cardiovascular exercise as tolerated encouraged.   Persistent A-fib, tachybradycardia syndrome, s/p PPM Denies any tachycardia or palpitations.  Doing well after her pacemaker implantation.  Denies any issues.  Continue Eliquis 2.5 mg BID, on appropriate dosage. Heart healthy diet and regular cardiovascular exercise as tolerated encouraged.  Follow-up with EP as scheduled.  AS, s/p bioprosthetic AVR Echo 12/2022 revealed 21 mm magna pericardial bioprosthetic aortic valve with overall normal valvular function.  Continue current medication regimen. SBE prophylaxis discussed. Heart healthy diet and regular cardiovascular exercise as tolerated encouraged.   HTN Blood pressure stable.  Discussed to monitor BP at home at least 2 hours after medications and sitting for 5-10 minutes. Continue current medication regimen.  Low salt, heart healthy  diet recommended.  HLD Past lipid panel revealed LDL 85. Continue atorvastatin and fenofibrate. Heart healthy diet and regular cardiovascular exercise as tolerated encouraged.   COPD, DOE Breathing is stable per her report.  Continue current medication regimen. Continue to f/u with Pulmonology and PCP. Continue oxygen therapy.   CKD stage IV Recent sCr was stable. Avoid nephrotoxic agents. No medication changes at this time. Continue to follow-up with Nephrology.     Disposition: Follow-up with me or APP in 3-4 months or sooner if anything changes.     Medication Adjustments/Labs and Tests Ordered: Current medicines are reviewed at length with the patient today.  Concerns regarding medicines are outlined above.  No orders of the defined types were placed in this encounter.  Meds ordered this encounter  Medications   spironolactone (ALDACTONE) 25 MG tablet    Sig: Take 0.5 tablets (12.5 mg total) by mouth daily.    Dispense:  45 tablet    Refill:  1    Patient Instructions  Medication Instructions:  Your physician recommends that you continue on your current medications as directed. Please refer to the Current Medication list given to you today.  Labwork: None   Testing/Procedures: None   Follow-Up: Your physician recommends that you schedule a follow-up appointment in: 3-4 months   Any Other Special Instructions Will Be Listed Below (If Applicable).  If you need a refill on your cardiac medications before your next appointment, please call your pharmacy.   Signed, Sharlene Dory, NP

## 2023-05-15 NOTE — Patient Instructions (Addendum)
Medication Instructions:  Your physician recommends that you continue on your current medications as directed. Please refer to the Current Medication list given to you today.  Labwork: None   Testing/Procedures: None   Follow-Up: Your physician recommends that you schedule a follow-up appointment in: 3-4 months   Any Other Special Instructions Will Be Listed Below (If Applicable).  If you need a refill on your cardiac medications before your next appointment, please call your pharmacy.

## 2023-05-16 DIAGNOSIS — I5020 Unspecified systolic (congestive) heart failure: Secondary | ICD-10-CM | POA: Diagnosis not present

## 2023-05-16 DIAGNOSIS — R0602 Shortness of breath: Secondary | ICD-10-CM | POA: Diagnosis not present

## 2023-05-27 DIAGNOSIS — D6869 Other thrombophilia: Secondary | ICD-10-CM | POA: Diagnosis not present

## 2023-05-27 DIAGNOSIS — J9610 Chronic respiratory failure, unspecified whether with hypoxia or hypercapnia: Secondary | ICD-10-CM | POA: Diagnosis not present

## 2023-05-27 DIAGNOSIS — M109 Gout, unspecified: Secondary | ICD-10-CM | POA: Diagnosis not present

## 2023-05-27 DIAGNOSIS — R32 Unspecified urinary incontinence: Secondary | ICD-10-CM | POA: Diagnosis not present

## 2023-05-27 DIAGNOSIS — K219 Gastro-esophageal reflux disease without esophagitis: Secondary | ICD-10-CM | POA: Diagnosis not present

## 2023-05-27 DIAGNOSIS — L309 Dermatitis, unspecified: Secondary | ICD-10-CM | POA: Diagnosis not present

## 2023-05-27 DIAGNOSIS — N1832 Chronic kidney disease, stage 3b: Secondary | ICD-10-CM | POA: Diagnosis not present

## 2023-05-27 DIAGNOSIS — Z87891 Personal history of nicotine dependence: Secondary | ICD-10-CM | POA: Diagnosis not present

## 2023-05-27 DIAGNOSIS — R269 Unspecified abnormalities of gait and mobility: Secondary | ICD-10-CM | POA: Diagnosis not present

## 2023-05-27 DIAGNOSIS — D509 Iron deficiency anemia, unspecified: Secondary | ICD-10-CM | POA: Diagnosis not present

## 2023-05-27 DIAGNOSIS — I130 Hypertensive heart and chronic kidney disease with heart failure and stage 1 through stage 4 chronic kidney disease, or unspecified chronic kidney disease: Secondary | ICD-10-CM | POA: Diagnosis not present

## 2023-05-27 DIAGNOSIS — K5900 Constipation, unspecified: Secondary | ICD-10-CM | POA: Diagnosis not present

## 2023-05-27 DIAGNOSIS — E785 Hyperlipidemia, unspecified: Secondary | ICD-10-CM | POA: Diagnosis not present

## 2023-05-27 DIAGNOSIS — M1990 Unspecified osteoarthritis, unspecified site: Secondary | ICD-10-CM | POA: Diagnosis not present

## 2023-05-27 DIAGNOSIS — M545 Low back pain, unspecified: Secondary | ICD-10-CM | POA: Diagnosis not present

## 2023-05-27 DIAGNOSIS — Z8249 Family history of ischemic heart disease and other diseases of the circulatory system: Secondary | ICD-10-CM | POA: Diagnosis not present

## 2023-05-27 DIAGNOSIS — E876 Hypokalemia: Secondary | ICD-10-CM | POA: Diagnosis not present

## 2023-05-27 DIAGNOSIS — I2510 Atherosclerotic heart disease of native coronary artery without angina pectoris: Secondary | ICD-10-CM | POA: Diagnosis not present

## 2023-06-02 DIAGNOSIS — H43813 Vitreous degeneration, bilateral: Secondary | ICD-10-CM | POA: Diagnosis not present

## 2023-06-06 DIAGNOSIS — I5020 Unspecified systolic (congestive) heart failure: Secondary | ICD-10-CM | POA: Diagnosis not present

## 2023-06-06 DIAGNOSIS — R0602 Shortness of breath: Secondary | ICD-10-CM | POA: Diagnosis not present

## 2023-06-15 DIAGNOSIS — R0602 Shortness of breath: Secondary | ICD-10-CM | POA: Diagnosis not present

## 2023-06-15 DIAGNOSIS — I5020 Unspecified systolic (congestive) heart failure: Secondary | ICD-10-CM | POA: Diagnosis not present

## 2023-06-30 ENCOUNTER — Other Ambulatory Visit: Payer: Self-pay | Admitting: Cardiology

## 2023-06-30 NOTE — Telephone Encounter (Signed)
 Prescription refill request for Eliquis received. Indication: AF Last office visit: 05/15/23  Shawnie Dapper NP Scr: 1.87 on 05/07/23  Epic Age: 83 Weight: 105.7kg  Based on above findings Eliquis 2.5mg  twice daily is the appropriate dose.  Refill approved.

## 2023-07-04 ENCOUNTER — Encounter: Payer: Self-pay | Admitting: Cardiovascular Disease

## 2023-07-04 ENCOUNTER — Ambulatory Visit: Payer: Medicare PPO

## 2023-07-04 ENCOUNTER — Ambulatory Visit: Payer: Medicare PPO | Attending: Cardiovascular Disease | Admitting: Cardiovascular Disease

## 2023-07-04 VITALS — BP 118/64 | HR 63 | Ht 63.0 in | Wt 232.0 lb

## 2023-07-04 DIAGNOSIS — I495 Sick sinus syndrome: Secondary | ICD-10-CM | POA: Diagnosis not present

## 2023-07-04 DIAGNOSIS — I4819 Other persistent atrial fibrillation: Secondary | ICD-10-CM | POA: Diagnosis not present

## 2023-07-04 DIAGNOSIS — I10 Essential (primary) hypertension: Secondary | ICD-10-CM

## 2023-07-04 NOTE — Progress Notes (Signed)
 Electrophysiology Office Note:    Date:  07/04/2023   ID:  [name], DOB 1941-03-30, MRN [id number]  PCP:  Rosamond Leta NOVAK, MD   [facility] Providers Cardiologist:  Jayson Sierras, MD Electrophysiologist:  Eulas FORBES Furbish, MD     Referring MD: Rosamond Leta NOVAK, MD   History of Present Illness:    [name] is a 83 y.o. female with a medical history significant for persistent atrial fibrillation, coronary disease status post CABG in 2009, aortic stenosis status post bioprosthetic AVR, chronic CHFpEF, COPD, CKD 4, referred for arrhythmia management.     She has a significant cardiac history with two-vessel bypass and bioprosthetic AVR in 2009.  The most recent echo in 2024 showed normal valve function with trivial regurgitation and normal ejection fraction.  She was admitted to [facility] in January 2024 with shortness of breath despite supplemental oxygen  and home.  She was transferred to [facility] due to elevated troponin and concern for NSTEMI.  She was diuresed for acute CHFpEF with an EF of 65-70% and grade 2 diastolic dysfunction.  Metoprolol  was stopped due to bradycardia with heart rate in the 30s during the admission.  A monitor was placed in January.  Results are detailed below.  Of note she had symptomatic bradycardia with heart rates in the 40s.  This occurred in the middle of the night upon awakening to go to the bathroom.  She was hospitalized again at [facility] in July 2024 with recurrence of shortness of breath.  Hypoxic/hypercarbic respiratory failure was treated with BiPAP, steroids, nebulizer, and diuretics.  She was in atrial fibrillation with slow ventricular rates.  She was discharged on metoprolol  12.5.  A ZIO monitor was placed with results detailed below.  In brief, this showed frequent episodes of atrial fibrillation and likely atypical flutter with poorly controlled rates.  It also showed asymptomatic pauses of up to 7.4 seconds which  appeared to be prolonged sinus node refractoriness after conversion from atrial fibrillation.    She underwent placement of a Medtronic dual-chamber pacemaker in October 2024.  Since device implant, she has not noticed any significant change in energy level. she has no device related complaints -- no new tenderness, drainage, redness.   Today, she reports that she is at baseline.   EKGs/Labs/Other Studies Reviewed Today:    Echocardiogram:  TTE July 05, 2022 Ejection fraction 65 to 70%.  No wall motion abnormalities.  Moderate concentric LVH.  Indeterminate diastolic filling.  Bioprosthetic aortic valve is functioning normally with only trivial regurgitation. Left atrial diameter 4.7 cm   Monitors:  Zio patch 14 days January 2024 - my interpretation Sinus rhythm 33 to 123 bpm, average 64. 1.9% PACs, 3.4% PVCs There were multiple symptom triggered events typically associated with sinus rhythm.  At least one episode of shortness of breath occurred at about 12 AM when the patient got up to go to the bathroom and correlated with sinus and junctional rhythm with a rate of 43 bpm. There were 2 pauses 1 3 seconds, the other 3.8 seconds.  Neither correlated with symptoms.  1 was at 2 AM, the other at 2:53 PM and was preceded by a brief run of wide-complex tachycardia.  Zio monitor July 2024 - my interpretation Sinus rhythm 4216 bpm, average 65 Faxed strips are unintelligible.  There are multiple runs labile atrial flutter ?82% burden of atrial fibrillation/flutter, AVG rate 93 bpm There were multiple pauses, the longest 7.4 seconds.  None was  associated with the patient triggered event.  These appear to be postconversion pauses.  Stress testing:   Advanced imaging:   Cardiac catherization   EKG:         Physical Exam:    VS:  LMP  (LMP Unknown)     Wt Readings from Last 3 Encounters:  05/15/23 233 lb (105.7 kg)  04/14/23 235 lb 3.2 oz (106.7 kg)  04/03/23 227 lb (103 kg)      GEN:  Well nourished, well developed in no acute distress CARDIAC: brady RR, no murmurs, rubs, gallops The device site is normal -- no tenderness, edema, drainage, redness, threatened erosion.  RESPIRATORY:  Normal work of breathing MUSCULOSKELETAL: trace edema    ASSESSMENT & PLAN:    Atrial fibrillation with RVR Paroxysmal Symptomatic with high heart rates She is not a good candidate for ablation  Bradycardia; tachybradycardia syndrome; Medtronic dual chamber pacemaker She has had both sinus node dysfunction resulting in symptoms of shortness of breath while awake as well as prolonged post-conversion pauses of over 7 seconds documented S/p placement of a medtronic dual chamber pacemaker I reviewed today's device interrogation. See PaceArt for details Rate response turned on today due to fatigue and flat histograms   Chronic heart failure with preserved ejection fraction Currently on torsemide  and metolazone .  Aortic stenosis status post bioprosthetic aortic valve replacement Continue follow-up in general cardiology clinic  CKD stage IV Limits potential rhythm control options for atrial fibrillation  COPD Has significant shortness of breath at times without overt volume overload Was admitted with hypoxic/hypercarbic respiratory failure    Signed, Eulas FORBES Furbish, MD  07/04/2023 9:06 AM    [facility]

## 2023-07-04 NOTE — Patient Instructions (Signed)
 Medication Instructions:  Continue all current medications.   Labwork: none  Testing/Procedures: none  Follow-Up: 6 months   Any Other Special Instructions Will Be Listed Below (If Applicable).   If you need a refill on your cardiac medications before your next appointment, please call your pharmacy.

## 2023-07-05 LAB — CUP PACEART REMOTE DEVICE CHECK
Battery Remaining Longevity: 169 mo
Battery Voltage: 3.2 V
Brady Statistic AP VP Percent: 0.09 %
Brady Statistic AP VS Percent: 86.15 %
Brady Statistic AS VP Percent: 0 %
Brady Statistic AS VS Percent: 13.76 %
Brady Statistic RA Percent Paced: 86.42 %
Brady Statistic RV Percent Paced: 0.1 %
Date Time Interrogation Session: 20250110013902
Implantable Lead Connection Status: 753985
Implantable Lead Connection Status: 753985
Implantable Lead Implant Date: 20241010
Implantable Lead Implant Date: 20241010
Implantable Lead Location: 753859
Implantable Lead Location: 753860
Implantable Lead Model: 3830
Implantable Lead Model: 5076
Implantable Pulse Generator Implant Date: 20241010
Lead Channel Impedance Value: 380 Ohm
Lead Channel Impedance Value: 475 Ohm
Lead Channel Impedance Value: 570 Ohm
Lead Channel Impedance Value: 665 Ohm
Lead Channel Pacing Threshold Amplitude: 0.625 V
Lead Channel Pacing Threshold Amplitude: 0.875 V
Lead Channel Pacing Threshold Pulse Width: 0.4 ms
Lead Channel Pacing Threshold Pulse Width: 0.4 ms
Lead Channel Sensing Intrinsic Amplitude: 31.625 mV
Lead Channel Sensing Intrinsic Amplitude: 31.625 mV
Lead Channel Sensing Intrinsic Amplitude: 4.25 mV
Lead Channel Sensing Intrinsic Amplitude: 4.25 mV
Lead Channel Setting Pacing Amplitude: 1.5 V
Lead Channel Setting Pacing Amplitude: 2 V
Lead Channel Setting Pacing Pulse Width: 0.4 ms
Lead Channel Setting Sensing Sensitivity: 1.2 mV
Zone Setting Status: 755011

## 2023-07-07 DIAGNOSIS — E873 Alkalosis: Secondary | ICD-10-CM | POA: Diagnosis not present

## 2023-07-07 DIAGNOSIS — R0602 Shortness of breath: Secondary | ICD-10-CM | POA: Diagnosis not present

## 2023-07-07 DIAGNOSIS — I5020 Unspecified systolic (congestive) heart failure: Secondary | ICD-10-CM | POA: Diagnosis not present

## 2023-07-07 LAB — CUP PACEART INCLINIC DEVICE CHECK
Date Time Interrogation Session: 20250110102220
Implantable Lead Connection Status: 753985
Implantable Lead Connection Status: 753985
Implantable Lead Implant Date: 20241010
Implantable Lead Implant Date: 20241010
Implantable Lead Location: 753859
Implantable Lead Location: 753860
Implantable Lead Model: 3830
Implantable Lead Model: 5076
Implantable Pulse Generator Implant Date: 20241010

## 2023-07-08 ENCOUNTER — Other Ambulatory Visit: Payer: Self-pay | Admitting: Nurse Practitioner

## 2023-07-14 DIAGNOSIS — N2581 Secondary hyperparathyroidism of renal origin: Secondary | ICD-10-CM | POA: Diagnosis not present

## 2023-07-14 DIAGNOSIS — R809 Proteinuria, unspecified: Secondary | ICD-10-CM | POA: Diagnosis not present

## 2023-07-14 DIAGNOSIS — I129 Hypertensive chronic kidney disease with stage 1 through stage 4 chronic kidney disease, or unspecified chronic kidney disease: Secondary | ICD-10-CM | POA: Diagnosis not present

## 2023-07-14 DIAGNOSIS — N184 Chronic kidney disease, stage 4 (severe): Secondary | ICD-10-CM | POA: Diagnosis not present

## 2023-07-16 DIAGNOSIS — R0602 Shortness of breath: Secondary | ICD-10-CM | POA: Diagnosis not present

## 2023-07-16 DIAGNOSIS — I5020 Unspecified systolic (congestive) heart failure: Secondary | ICD-10-CM | POA: Diagnosis not present

## 2023-07-28 DIAGNOSIS — I25119 Atherosclerotic heart disease of native coronary artery with unspecified angina pectoris: Secondary | ICD-10-CM | POA: Diagnosis not present

## 2023-07-28 DIAGNOSIS — I5032 Chronic diastolic (congestive) heart failure: Secondary | ICD-10-CM | POA: Diagnosis not present

## 2023-07-28 DIAGNOSIS — I4891 Unspecified atrial fibrillation: Secondary | ICD-10-CM | POA: Diagnosis not present

## 2023-07-28 DIAGNOSIS — I10 Essential (primary) hypertension: Secondary | ICD-10-CM | POA: Diagnosis not present

## 2023-07-28 DIAGNOSIS — Z299 Encounter for prophylactic measures, unspecified: Secondary | ICD-10-CM | POA: Diagnosis not present

## 2023-07-28 DIAGNOSIS — I700 Atherosclerosis of aorta: Secondary | ICD-10-CM | POA: Diagnosis not present

## 2023-08-07 DIAGNOSIS — R0602 Shortness of breath: Secondary | ICD-10-CM | POA: Diagnosis not present

## 2023-08-07 DIAGNOSIS — I5020 Unspecified systolic (congestive) heart failure: Secondary | ICD-10-CM | POA: Diagnosis not present

## 2023-08-08 NOTE — Addendum Note (Signed)
Addended by: Elease Etienne A on: 08/08/2023 12:42 PM   Modules accepted: Orders

## 2023-08-08 NOTE — Progress Notes (Signed)
Remote pacemaker transmission.

## 2023-08-16 DIAGNOSIS — R0602 Shortness of breath: Secondary | ICD-10-CM | POA: Diagnosis not present

## 2023-08-16 DIAGNOSIS — I5020 Unspecified systolic (congestive) heart failure: Secondary | ICD-10-CM | POA: Diagnosis not present

## 2023-08-19 ENCOUNTER — Ambulatory Visit: Payer: Medicare PPO | Attending: Nurse Practitioner | Admitting: Nurse Practitioner

## 2023-08-19 ENCOUNTER — Encounter: Payer: Self-pay | Admitting: Nurse Practitioner

## 2023-08-19 VITALS — BP 142/70 | HR 87 | Ht 62.0 in | Wt 235.6 lb

## 2023-08-19 DIAGNOSIS — I4819 Other persistent atrial fibrillation: Secondary | ICD-10-CM

## 2023-08-19 DIAGNOSIS — I5032 Chronic diastolic (congestive) heart failure: Secondary | ICD-10-CM

## 2023-08-19 DIAGNOSIS — J449 Chronic obstructive pulmonary disease, unspecified: Secondary | ICD-10-CM | POA: Diagnosis not present

## 2023-08-19 DIAGNOSIS — N184 Chronic kidney disease, stage 4 (severe): Secondary | ICD-10-CM

## 2023-08-19 DIAGNOSIS — E785 Hyperlipidemia, unspecified: Secondary | ICD-10-CM

## 2023-08-19 DIAGNOSIS — Z950 Presence of cardiac pacemaker: Secondary | ICD-10-CM

## 2023-08-19 DIAGNOSIS — I2510 Atherosclerotic heart disease of native coronary artery without angina pectoris: Secondary | ICD-10-CM

## 2023-08-19 DIAGNOSIS — Z953 Presence of xenogenic heart valve: Secondary | ICD-10-CM

## 2023-08-19 DIAGNOSIS — I10 Essential (primary) hypertension: Secondary | ICD-10-CM

## 2023-08-19 DIAGNOSIS — R0609 Other forms of dyspnea: Secondary | ICD-10-CM | POA: Diagnosis not present

## 2023-08-19 DIAGNOSIS — I890 Lymphedema, not elsewhere classified: Secondary | ICD-10-CM

## 2023-08-19 NOTE — Patient Instructions (Addendum)
 Medication Instructions:  Your physician recommends that you continue on your current medications as directed. Please refer to the Current Medication list given to you today.  Labwork: None   Testing/Procedures: None   Follow-Up: Your physician recommends that you schedule a follow-up appointment in: 4-6 months   Any Other Special Instructions Will Be Listed Below (If Applicable).  If you need a refill on your cardiac medications before your next appointment, please call your pharmacy.

## 2023-08-19 NOTE — Progress Notes (Unsigned)
 Cardiology Office Note:   Date: 08/19/2023 ID:  AUDRIELLA BLAKELEY, DOB 03-31-1941, MRN [id number] PCP:  Ignatius Specking, MD [facility] Providers Cardiologist:  [name], MD Electrophysiologist:  Maurice Small, MD   Referring MD: Ignatius Specking, MD  CC: Here for scheduled follow-up History of Present Illness:    [name] is a 83 y.o. female with a PMH of CAD, s/p CABG in 2009, HFpEF, AS, s/p bioprosthetic aortic valve replacement, tachybradycardia syndrome, s/p PPM, persistent A-fib, HTN, HLD, COPD, and CKD stage IV, who presents today for scheduled follow-up.   Has a remote history of CABG x 2 with SVG-RCA and LIMA-LAD as well as AVR in 2009.  NST in 2019 was negative for ischemia.  Echocardiogram in June 2023 showed normal EF, with dynamic LVOT and moderate symmetrical LVH.  Aortic valve was stable with normal function and mild stenosis/regurgitation.  She presented to [facility] in January 2024 with shortness of breath despite home use of O2 at 2 to 3 L and had associated chest pressure.  EKG was negative. Troponins were elevated with peak at 2,302. CXR revealed cardiomegaly with mild pulmonary vascular congestion mild by basilar and mid right lung atelectasis.  Noted increasing DOE over the past month. Transferred to [facility] due to concern for NSTEMI.  Diuresed in the ED.  TTE revealed EF 65 to 70%, grade 2 DD, stable aortic valve. Therapy recommended home health PT/OT. Metoprolol was stopped due to patient being bradycardic with heart rate in 30s.   Hospital admission 12/2022 at [facility] for shortness of breath and acute on chronic hypoxic/hypercarbic respiratory failure, placed on BiPAP, diuresed and tx with steroids and nebulizer. EKG showed A-fib with slow ventricular rate in 40's, however was d/c on Toprol 12.5 mg daily. Trop were slightly elevated secondary to chronic heart failure. TTE was updated and full report noted below.  Last seen by Dr. Diona Browner on January 15, 2023.  She  was doing well at that time and was diuresed well.  Was still using supplemental oxygen. Was instructed to take Metolazone 2.5 mg weekly, then as needed per discharge instructions.   Was evaluated by EP on February 20, 2023 and underwent PPM on 04/03/2023 for tachybradycardia syndrome.   05/15/2023 - Today she presents for follow-up. Doing better and leg swelling has improved. Denies any chest pain, worsening shortness of breath, palpitations, syncope, presyncope, dizziness, orthopnea, PND, swelling or significant weight changes, acute bleeding, or claudication. She is only taking Metolazone 2.5 mg once per week PRN for edema.   08/19/2023 - Doing well.  Denies any acute cardiac complaints or issues.  Weight is stable. Tolerating her medications well. Denies any chest pain, worsening shortness of breath, palpitations, syncope, presyncope, dizziness, orthopnea, PND, swelling or significant weight changes, acute bleeding, or claudication.  Please see the history of present illness.    All other systems reviewed and are negative.   EKGs/Labs/Other Studies Reviewed:    The following studies were reviewed today:  EKG: EKG is not ordered today.  Echo 12/2022 ([facility]): Summary 1. The left ventricle is normal in size with mildly to moderately increased wall thickness. 2. The left ventricular systolic function is normal, LVEF is visually estimated at 65-70%. 3. There is grade II diastolic dysfunction (elevated filling pressure). 4. The mitral valve leaflets are mildly thickened with normal leaflet mobility. 5. Mitral annular calcification is present. 6. Aortic valve replacement (21 mm bioprosthetic, implantation date: 2009). 7. Aortic valve Doppler indices are consistent  with normal prosthetic valve function. 8. The left atrium is moderately dilated in size. 9. The right ventricle is mildly dilated in size, with normal systolic function. 10. There is mild to moderate tricuspid regurgitation. 11. There is  mild-moderate pulmonary hypertension. 12. The right atrium is mildly dilated in size. 13. IVC size and inspiratory change suggest mildly elevated right atrial pressure. (5-10 mmHg). Left Ventricle The left ventricle is normal in size with mildly to moderately increased wall thickness. The left ventricular systolic function is normal, LVEF is visually estimated at 65-70%. There is grade II diastolic dysfunction (elevated filling pressure). Right Ventricle The right ventricle is mildly dilated in size, with normal systolic function. Left Atrium The left atrium is moderately dilated in size. Right Atrium The right atrium is mildly dilated in size. Aortic Valve Aortic valve replacement (21 mm bioprosthetic, implantation date: 2009). The prosthetic aortic valve is well seated. Peak AV transvalvular velocity: 2.0 m/s. Mean gradient: 10 mmHg. Doppler velocity index: 0.47. LVOT diameter: 1.9 cm. LVOT stroke volume index: 33 ml/m2. AV estimated effective orifice area (by continuity equation): 1.5 cm2. There is trace regurgitation of the prosthetic aortic valve. Aortic valve Doppler indices are consistent with normal prosthetic valve function. AV Doppler velocity ratio (dimensionless index): 0.47. Mitral Valve The mitral valve leaflets are mildly thickened with normal leaflet mobility. Mitral annular calcification is present. There is trivial mitral valve regurgitation. Tricuspid Valve The tricuspid valve leaflets are normal, with normal leaflet mobility. There is mild to moderate tricuspid regurgitation. There is no tricuspid valve stenosis. There is mild-moderate pulmonary hypertension. TR maximum velocity: 3.2 m/s Estimated PASP: 48 mmHg. Pulmonic Valve The pulmonic valve is normal. There is mild pulmonic regurgitation. There is no evidence of a significant transvalvular gradient. Aorta The aorta is normal in size in the visualized segments. Inferior Vena Cava IVC size and inspiratory change suggest mildly elevated right  atrial pressure. (5-10 mmHg). Pericardium/Pleural There is no pericardial effusion.  Cardiac monitor on 07/31/2022: Predominant rhythm is sinus with IVCD.  Heart rate ranged from 33 bpm up to 123 bpm and average heart rate 64 bpm. Occasional PACs were noted representing 1.9% total beats with otherwise rare atrial couplets and triplets. Occasional PVCs representing 3.4% total beats were noted with otherwise rare ventricular couplets and triplets.  Also limited episodes of ventricular bigeminy and trigeminy. There were two brief episodes of NSVT noted, the longest of which lasted 6 beats. There were five episodes of PSVT noted, the longest of which lasted 20 beats. There were two pauses noted, the longest of which lasted 3.8 seconds (2:03 AM on February 1).  The second pause was 3 seconds in duration (2:53 PM on January 20).  Neither of these were patient triggered events.  Also intermittently noted junctional rhythm and possible idioventricular rhythm observed in the setting of bradycardia.   Limited Echo 07/05/2022:  1. Left ventricular ejection fraction, by estimation, is 65 to 70%. The  left ventricle has normal function. The left ventricle has no regional  wall motion abnormalities. There is moderate concentric left ventricular  hypertrophy. Indeterminate diastolic  filling due to E-A fusion.   2. Right ventricular systolic function was not well visualized. The right  ventricular size is not well visualized. There is normal pulmonary artery  systolic pressure. The estimated right ventricular systolic pressure is  12.1 mmHg.   3. The mitral valve is degenerative. Trivial mitral valve regurgitation.  No evidence of mitral stenosis.   4. 21 mm Magna pericardial bioprosthetic aortic  valve. Vmax 2.5 m/s, MG  14 mmHg, EOA 1.96 cm2, DI 0.62. Trivial regurgitation noted. The aortic  valve has been repaired/replaced. Aortic valve regurgitation is trivial.  Procedure Date: 12/10/2007.   5. The  inferior vena cava is normal in size with greater than 50%  respiratory variability, suggesting right atrial pressure of 3 mmHg.   Comparison(s): No significant change from prior study.   Lexiscan on 12/23/2017: There was no ST segment deviation noted during stress. Sinus rhythm with right bundle branch block and frequent PACs seen throughout study. Defect 1: There is a defect present in the mid anterior and apical anterior location. Images appear to be improved with stress consistent with reversed redistribution. There are no significant ischemic territories. This is a low risk study. Nuclear stress EF: 48%.  Physical Exam:    VS:  BP (!) 123/57 (BP Location: Right Arm, Patient Position: Sitting, Cuff Size: Large)   Pulse 87   Ht 5\' 2"  (1.575 m)   Wt 235 lb 9.6 oz (106.9 kg)   LMP  (LMP Unknown)   SpO2 95%   BMI 43.09 kg/m     Wt Readings from Last 3 Encounters:  08/19/23 235 lb 9.6 oz (106.9 kg)  07/04/23 232 lb (105.2 kg)  05/15/23 233 lb (105.7 kg)   GEN: Obese, 83 y.o. female in no acute distress, wearing chronic O2 HEENT: Normal NECK: No JVD; No carotid bruits CARDIAC: S1/S2, RRR, Grade 2/6 murmur, no rubs, no gallops; 2+ pulses RESPIRATORY:  Overall clear and diminished, without wheezing or rhonchi  MUSCULOSKELETAL:  no pitting edema, trace lymphedema R>L (chronic per patient's report and stable per her report); No deformity  SKIN: Warm and dry NEUROLOGIC:  Alert and oriented x 3 PSYCHIATRIC:  Normal affect   ASSESSMENT & PLAN:    In order of problems listed above:  Chronic HFpEF Stage C, NYHA class II-III symptoms. Weight stable. Euvolemic and well compensated on exam. Continue current GDMT. Patient verbalized understanding. GDMT limited with kidney disease. Low sodium diet, fluid restriction <2L, and daily weights encouraged. Educated to contact our office for weight gain of 2 lbs overnight or 5 lbs in one week.   CAD, s/p CABG in 2009 Stable with no anginal  symptoms. No indication for ischemic evaluation. Continue current medication regimen. Heart healthy diet and regular cardiovascular exercise as tolerated encouraged.   Persistent A-fib, tachybradycardia syndrome, s/p PPM Denies any tachycardia or palpitations. Denies any issues.  Continue Eliquis 2.5 mg BID, on appropriate dosage.  Normal device interrogation in office in January 2025.  Heart healthy diet and regular cardiovascular exercise as tolerated encouraged.  Follow-up with EP as scheduled.  AS, s/p bioprosthetic AVR Echo 12/2022 revealed 21 mm magna pericardial bioprosthetic aortic valve with overall normal valvular function.  Continue current medication regimen. SBE prophylaxis discussed. Heart healthy diet and regular cardiovascular exercise as tolerated encouraged.  Plan to consider updating echocardiogram at next office visit.   HTN Blood pressure stable.  Discussed to monitor BP at home at least 2 hours after medications and sitting for 5-10 minutes. Continue current medication regimen.  Low salt, heart healthy diet recommended.  HLD Past lipid panel revealed LDL 85. Continue atorvastatin and fenofibrate. Heart healthy diet and regular cardiovascular exercise as tolerated encouraged.   COPD, DOE Breathing is stable per her report.  Continue current medication regimen. Continue to f/u with Pulmonology and PCP. Continue oxygen therapy.   CKD stage IV Recent sCr was relatively stable. Avoid nephrotoxic agents. No medication  changes at this time. Continue to follow-up with Nephrology.   9. Lymphedema Slight/trace lymphedema to RLE than LLE, pt says it has been chronic and stable since her surgery in 2009. Continue current medication regimen. Low salt, heart healthy diet recommended. Leg elevation recommended.     Disposition: Follow-up with me or APP in 4-6 months or sooner if anything changes.    Medication Adjustments/Labs and Tests Ordered: Current medicines are reviewed at  length with the patient today.  Concerns regarding medicines are outlined above.  No orders of the defined types were placed in this encounter.  No orders of the defined types were placed in this encounter.   Patient Instructions  Medication Instructions:  Your physician recommends that you continue on your current medications as directed. Please refer to the Current Medication list given to you today.  Labwork: None   Testing/Procedures: None   Follow-Up: Your physician recommends that you schedule a follow-up appointment in: 4-6 months   Any Other Special Instructions Will Be Listed Below (If Applicable).  If you need a refill on your cardiac medications before your next appointment, please call your pharmacy.   Signed, Sharlene Dory, NP

## 2023-08-28 ENCOUNTER — Other Ambulatory Visit: Payer: Self-pay | Admitting: Cardiology

## 2023-09-04 DIAGNOSIS — R0602 Shortness of breath: Secondary | ICD-10-CM | POA: Diagnosis not present

## 2023-09-04 DIAGNOSIS — I5020 Unspecified systolic (congestive) heart failure: Secondary | ICD-10-CM | POA: Diagnosis not present

## 2023-09-08 ENCOUNTER — Telehealth: Payer: Self-pay | Admitting: Pharmacy Technician

## 2023-09-08 ENCOUNTER — Other Ambulatory Visit (HOSPITAL_COMMUNITY): Payer: Self-pay

## 2023-09-08 NOTE — Telephone Encounter (Signed)
 Pharmacy Patient Advocate Encounter  Received notification from [facility] that Prior Authorization for Eliquis has been APPROVED from 06/25/23 to 06/23/24. Ran test claim, Copay is $40.00- one month. This test claim was processed through [facility]- copay amounts may vary at other pharmacies due to pharmacy/plan contracts, or as the patient moves through the different stages of their insurance plan.   PA #/Case ID/Reference #: [id number]

## 2023-09-08 NOTE — Telephone Encounter (Signed)
 Pharmacy Patient Advocate Encounter   Received notification from Fax that prior authorization for Eliquis is required/requested.   Insurance verification completed.   The patient is insured through [employer] .   Per test claim: PA required; PA submitted to above mentioned insurance via CoverMyMeds Key/confirmation #/EOC [id number] Status is pending

## 2023-09-13 DIAGNOSIS — R0602 Shortness of breath: Secondary | ICD-10-CM | POA: Diagnosis not present

## 2023-09-13 DIAGNOSIS — I5020 Unspecified systolic (congestive) heart failure: Secondary | ICD-10-CM | POA: Diagnosis not present

## 2023-10-03 ENCOUNTER — Ambulatory Visit: Payer: Medicare PPO

## 2023-10-03 DIAGNOSIS — I495 Sick sinus syndrome: Secondary | ICD-10-CM | POA: Diagnosis not present

## 2023-10-05 DIAGNOSIS — I5020 Unspecified systolic (congestive) heart failure: Secondary | ICD-10-CM | POA: Diagnosis not present

## 2023-10-05 DIAGNOSIS — R0602 Shortness of breath: Secondary | ICD-10-CM | POA: Diagnosis not present

## 2023-10-06 LAB — CUP PACEART REMOTE DEVICE CHECK
Battery Remaining Longevity: 164 mo
Battery Voltage: 3.18 V
Brady Statistic AP VP Percent: 0.12 %
Brady Statistic AP VS Percent: 90.23 %
Brady Statistic AS VP Percent: 0 %
Brady Statistic AS VS Percent: 9.65 %
Brady Statistic RA Percent Paced: 90.64 %
Brady Statistic RV Percent Paced: 0.12 %
Date Time Interrogation Session: 20250410235730
Implantable Lead Connection Status: 753985
Implantable Lead Connection Status: 753985
Implantable Lead Implant Date: 20241010
Implantable Lead Implant Date: 20241010
Implantable Lead Location: 753859
Implantable Lead Location: 753860
Implantable Lead Model: 3830
Implantable Lead Model: 5076
Implantable Pulse Generator Implant Date: 20241010
Lead Channel Impedance Value: 361 Ohm
Lead Channel Impedance Value: 475 Ohm
Lead Channel Impedance Value: 551 Ohm
Lead Channel Impedance Value: 646 Ohm
Lead Channel Pacing Threshold Amplitude: 0.75 V
Lead Channel Pacing Threshold Amplitude: 0.75 V
Lead Channel Pacing Threshold Pulse Width: 0.4 ms
Lead Channel Pacing Threshold Pulse Width: 0.4 ms
Lead Channel Sensing Intrinsic Amplitude: 29.625 mV
Lead Channel Sensing Intrinsic Amplitude: 29.625 mV
Lead Channel Sensing Intrinsic Amplitude: 4.125 mV
Lead Channel Sensing Intrinsic Amplitude: 4.125 mV
Lead Channel Setting Pacing Amplitude: 1.5 V
Lead Channel Setting Pacing Amplitude: 2 V
Lead Channel Setting Pacing Pulse Width: 0.4 ms
Lead Channel Setting Sensing Sensitivity: 1.2 mV
Zone Setting Status: 755011

## 2023-10-13 DIAGNOSIS — N189 Chronic kidney disease, unspecified: Secondary | ICD-10-CM | POA: Diagnosis not present

## 2023-10-13 DIAGNOSIS — D631 Anemia in chronic kidney disease: Secondary | ICD-10-CM | POA: Diagnosis not present

## 2023-10-20 DIAGNOSIS — R809 Proteinuria, unspecified: Secondary | ICD-10-CM | POA: Diagnosis not present

## 2023-10-20 DIAGNOSIS — N184 Chronic kidney disease, stage 4 (severe): Secondary | ICD-10-CM | POA: Diagnosis not present

## 2023-10-20 DIAGNOSIS — I5032 Chronic diastolic (congestive) heart failure: Secondary | ICD-10-CM | POA: Diagnosis not present

## 2023-10-20 DIAGNOSIS — I129 Hypertensive chronic kidney disease with stage 1 through stage 4 chronic kidney disease, or unspecified chronic kidney disease: Secondary | ICD-10-CM | POA: Diagnosis not present

## 2023-10-23 DIAGNOSIS — Z0000 Encounter for general adult medical examination without abnormal findings: Secondary | ICD-10-CM | POA: Diagnosis not present

## 2023-10-23 DIAGNOSIS — Z1331 Encounter for screening for depression: Secondary | ICD-10-CM | POA: Diagnosis not present

## 2023-10-23 DIAGNOSIS — Z6841 Body Mass Index (BMI) 40.0 and over, adult: Secondary | ICD-10-CM | POA: Diagnosis not present

## 2023-10-23 DIAGNOSIS — Z7189 Other specified counseling: Secondary | ICD-10-CM | POA: Diagnosis not present

## 2023-10-23 DIAGNOSIS — I5032 Chronic diastolic (congestive) heart failure: Secondary | ICD-10-CM | POA: Diagnosis not present

## 2023-10-23 DIAGNOSIS — Z299 Encounter for prophylactic measures, unspecified: Secondary | ICD-10-CM | POA: Diagnosis not present

## 2023-10-23 DIAGNOSIS — Z1339 Encounter for screening examination for other mental health and behavioral disorders: Secondary | ICD-10-CM | POA: Diagnosis not present

## 2023-10-23 DIAGNOSIS — I10 Essential (primary) hypertension: Secondary | ICD-10-CM | POA: Diagnosis not present

## 2023-10-28 ENCOUNTER — Other Ambulatory Visit: Payer: Self-pay | Admitting: Nurse Practitioner

## 2023-11-06 NOTE — Addendum Note (Signed)
 Addended by: Lott Rouleau A on: 11/06/2023 11:34 AM   Modules accepted: Orders

## 2023-11-06 NOTE — Progress Notes (Signed)
 Remote pacemaker transmission.

## 2023-12-22 DIAGNOSIS — R5383 Other fatigue: Secondary | ICD-10-CM | POA: Diagnosis not present

## 2023-12-22 DIAGNOSIS — Z0000 Encounter for general adult medical examination without abnormal findings: Secondary | ICD-10-CM | POA: Diagnosis not present

## 2023-12-22 DIAGNOSIS — E7800 Pure hypercholesterolemia, unspecified: Secondary | ICD-10-CM | POA: Diagnosis not present

## 2023-12-22 DIAGNOSIS — E039 Hypothyroidism, unspecified: Secondary | ICD-10-CM | POA: Diagnosis not present

## 2023-12-22 DIAGNOSIS — Z299 Encounter for prophylactic measures, unspecified: Secondary | ICD-10-CM | POA: Diagnosis not present

## 2023-12-22 DIAGNOSIS — Z79899 Other long term (current) drug therapy: Secondary | ICD-10-CM | POA: Diagnosis not present

## 2023-12-22 DIAGNOSIS — I10 Essential (primary) hypertension: Secondary | ICD-10-CM | POA: Diagnosis not present

## 2024-01-02 ENCOUNTER — Ambulatory Visit: Payer: Medicare PPO

## 2024-01-02 DIAGNOSIS — I495 Sick sinus syndrome: Secondary | ICD-10-CM | POA: Diagnosis not present

## 2024-01-03 LAB — CUP PACEART REMOTE DEVICE CHECK
Battery Remaining Longevity: 160 mo
Battery Voltage: 3.14 V
Brady Statistic AP VP Percent: 0.07 %
Brady Statistic AP VS Percent: 85.31 %
Brady Statistic AS VP Percent: 0 %
Brady Statistic AS VS Percent: 14.62 %
Brady Statistic RA Percent Paced: 85.38 %
Brady Statistic RV Percent Paced: 0.07 %
Date Time Interrogation Session: 20250711053752
Implantable Lead Connection Status: 753985
Implantable Lead Connection Status: 753985
Implantable Lead Implant Date: 20241010
Implantable Lead Implant Date: 20241010
Implantable Lead Location: 753859
Implantable Lead Location: 753860
Implantable Lead Model: 3830
Implantable Lead Model: 5076
Implantable Pulse Generator Implant Date: 20241010
Lead Channel Impedance Value: 361 Ohm
Lead Channel Impedance Value: 475 Ohm
Lead Channel Impedance Value: 532 Ohm
Lead Channel Impedance Value: 608 Ohm
Lead Channel Pacing Threshold Amplitude: 0.75 V
Lead Channel Pacing Threshold Amplitude: 0.875 V
Lead Channel Pacing Threshold Pulse Width: 0.4 ms
Lead Channel Pacing Threshold Pulse Width: 0.4 ms
Lead Channel Sensing Intrinsic Amplitude: 31.5 mV
Lead Channel Sensing Intrinsic Amplitude: 31.5 mV
Lead Channel Sensing Intrinsic Amplitude: 4.25 mV
Lead Channel Sensing Intrinsic Amplitude: 4.25 mV
Lead Channel Setting Pacing Amplitude: 1.5 V
Lead Channel Setting Pacing Amplitude: 2 V
Lead Channel Setting Pacing Pulse Width: 0.4 ms
Lead Channel Setting Sensing Sensitivity: 1.2 mV
Zone Setting Status: 755011

## 2024-01-06 ENCOUNTER — Other Ambulatory Visit: Payer: Self-pay | Admitting: Cardiology

## 2024-01-06 NOTE — Telephone Encounter (Signed)
 Prescription refill request for Eliquis  received. Indication: AF Last office visit: 08/19/23  FORBES Crate NP Scr: 2.18 on 10/13/23  Epic Age: 83 Weight: 106.9kg  Based on above findings Eliquis  2.5mg  twice daily is the appropriate dose.  Refill approved.

## 2024-01-07 ENCOUNTER — Ambulatory Visit: Payer: Self-pay | Admitting: Cardiovascular Disease

## 2024-01-07 DIAGNOSIS — N1832 Chronic kidney disease, stage 3b: Secondary | ICD-10-CM | POA: Diagnosis not present

## 2024-01-07 DIAGNOSIS — Z299 Encounter for prophylactic measures, unspecified: Secondary | ICD-10-CM | POA: Diagnosis not present

## 2024-01-07 DIAGNOSIS — I4891 Unspecified atrial fibrillation: Secondary | ICD-10-CM | POA: Diagnosis not present

## 2024-01-07 DIAGNOSIS — I10 Essential (primary) hypertension: Secondary | ICD-10-CM | POA: Diagnosis not present

## 2024-01-07 DIAGNOSIS — I5032 Chronic diastolic (congestive) heart failure: Secondary | ICD-10-CM | POA: Diagnosis not present

## 2024-01-07 DIAGNOSIS — J9611 Chronic respiratory failure with hypoxia: Secondary | ICD-10-CM | POA: Diagnosis not present

## 2024-01-19 DIAGNOSIS — R809 Proteinuria, unspecified: Secondary | ICD-10-CM | POA: Diagnosis not present

## 2024-01-19 DIAGNOSIS — N189 Chronic kidney disease, unspecified: Secondary | ICD-10-CM | POA: Diagnosis not present

## 2024-01-19 DIAGNOSIS — E211 Secondary hyperparathyroidism, not elsewhere classified: Secondary | ICD-10-CM | POA: Diagnosis not present

## 2024-01-19 DIAGNOSIS — I10 Essential (primary) hypertension: Secondary | ICD-10-CM | POA: Diagnosis not present

## 2024-01-19 DIAGNOSIS — D631 Anemia in chronic kidney disease: Secondary | ICD-10-CM | POA: Diagnosis not present

## 2024-01-23 ENCOUNTER — Ambulatory Visit: Attending: Cardiovascular Disease | Admitting: Cardiovascular Disease

## 2024-01-23 ENCOUNTER — Encounter: Payer: Self-pay | Admitting: Cardiovascular Disease

## 2024-01-23 VITALS — BP 141/74 | HR 69 | Ht 63.0 in | Wt 240.0 lb

## 2024-01-23 DIAGNOSIS — I5032 Chronic diastolic (congestive) heart failure: Secondary | ICD-10-CM

## 2024-01-23 DIAGNOSIS — I4819 Other persistent atrial fibrillation: Secondary | ICD-10-CM

## 2024-01-23 LAB — CUP PACEART INCLINIC DEVICE CHECK
Date Time Interrogation Session: 20250801165620
Implantable Lead Connection Status: 753985
Implantable Lead Connection Status: 753985
Implantable Lead Implant Date: 20241010
Implantable Lead Implant Date: 20241010
Implantable Lead Location: 753859
Implantable Lead Location: 753860
Implantable Lead Model: 3830
Implantable Lead Model: 5076
Implantable Pulse Generator Implant Date: 20241010

## 2024-01-23 NOTE — Progress Notes (Signed)
 Electrophysiology Office Note:    Date:  01/23/2024   ID:  LURLEAN KERNEN, DOB November 29, 1940, MRN [id number]  PCP:  Rosamond Leta NOVAK, MD   [facility] Providers Cardiologist:  Jayson Sierras, MD Electrophysiologist:  Eulas FORBES Furbish, MD     Referring MD: Rosamond Leta NOVAK, MD   History of Present Illness:    [name] is a 83 y.o. female with a medical history significant for persistent atrial fibrillation, coronary disease status post CABG in 2009, aortic stenosis status post bioprosthetic AVR, chronic CHFpEF, COPD, CKD 4, referred for arrhythmia management.     She has a significant cardiac history with two-vessel bypass and bioprosthetic AVR in 2009.  The most recent echo in 2024 showed normal valve function with trivial regurgitation and normal ejection fraction.  She was admitted to [facility] in January 2024 with shortness of breath despite supplemental oxygen  and home.  She was transferred to [facility] due to elevated troponin and concern for NSTEMI.  She was diuresed for acute CHFpEF with an EF of 65-70% and grade 2 diastolic dysfunction.  Metoprolol  was stopped due to bradycardia with heart rate in the 30s during the admission.  A monitor was placed in January.  Results are detailed below.  Of note she had symptomatic bradycardia with heart rates in the 40s.  This occurred in the middle of the night upon awakening to go to the bathroom.  She was hospitalized again at [facility] in July 2024 with recurrence of shortness of breath.  Hypoxic/hypercarbic respiratory failure was treated with BiPAP, steroids, nebulizer, and diuretics.  She was in atrial fibrillation with slow ventricular rates.  She was discharged on metoprolol  12.5.  A ZIO monitor was placed with results detailed below.  In brief, this showed frequent episodes of atrial fibrillation and likely atypical flutter with poorly controlled rates.  It also showed asymptomatic pauses of up to 7.4 seconds which  appeared to be prolonged sinus node refractoriness after conversion from atrial fibrillation.    She underwent placement of a Medtronic dual-chamber pacemaker in October 2024.  Since device implant, she has not noticed any significant change in energy level. she has no device related complaints -- no new tenderness, drainage, redness.   Today, she reports that she is at baseline.   EKGs/Labs/Other Studies Reviewed Today:    Echocardiogram:  TTE July 05, 2022 Ejection fraction 65 to 70%.  No wall motion abnormalities.  Moderate concentric LVH.  Indeterminate diastolic filling.  Bioprosthetic aortic valve is functioning normally with only trivial regurgitation. Left atrial diameter 4.7 cm   Monitors:  Zio patch 14 days January 2024 - my interpretation Sinus rhythm 33 to 123 bpm, average 64. 1.9% PACs, 3.4% PVCs There were multiple symptom triggered events typically associated with sinus rhythm.  At least one episode of shortness of breath occurred at about 12 AM when the patient got up to go to the bathroom and correlated with sinus and junctional rhythm with a rate of 43 bpm. There were 2 pauses 1 3 seconds, the other 3.8 seconds.  Neither correlated with symptoms.  1 was at 2 AM, the other at 2:53 PM and was preceded by a brief run of wide-complex tachycardia.  Zio monitor July 2024 - my interpretation Sinus rhythm 4216 bpm, average 65 Faxed strips are unintelligible.  There are multiple runs labile atrial flutter ?82% burden of atrial fibrillation/flutter, AVG rate 93 bpm There were multiple pauses, the longest 7.4 seconds.  None was  associated with the patient triggered event.  These appear to be postconversion pauses.  Stress testing:   Advanced imaging:   Cardiac catherization   EKG:   EKG Interpretation Date/Time:  Friday January 23 2024 13:50:05 EDT Ventricular Rate:  69 PR Interval:  196 QRS Duration:  154 QT Interval:  460 QTC Calculation: 492 R  Axis:   77  Text Interpretation: Atrial-paced rhythm Right bundle branch block When compared with ECG of 04-Jul-2023 10:29, Questionable change in QRS axis T wave inversion no longer evident in Inferior leads Confirmed by Nancey Scotts [phone]) on 01/23/2024 2:01:15 PM     Physical Exam:    VS:  BP (!) 141/74 (BP Location: Left Arm, Cuff Size: Large)   Pulse 69   Ht 5' 3 (1.6 m)   Wt 240 lb (108.9 kg)   LMP  (LMP Unknown)   BMI 42.51 kg/m     Wt Readings from Last 3 Encounters:  01/23/24 240 lb (108.9 kg)  08/19/23 235 lb 9.6 oz (106.9 kg)  07/04/23 232 lb (105.2 kg)     GEN:  Well nourished, well developed in no acute distress CARDIAC: brady RR, no murmurs, rubs, gallops The device site is normal -- no tenderness, edema, drainage, redness, threatened erosion.  RESPIRATORY:  Normal work of breathing MUSCULOSKELETAL: trace edema    ASSESSMENT & PLAN:    Atrial fibrillation with RVR Paroxysmal Symptomatic with high heart rates -- episodes brief, rates are moderately well controlled She is not a good candidate for ablation Continue eliquis  2.5mg   Bradycardia; tachybradycardia syndrome; Medtronic dual chamber pacemaker She has had both sinus node dysfunction resulting in symptoms of shortness of breath while awake as well as prolonged post-conversion pauses of over 7 seconds documented S/p placement of a medtronic dual chamber pacemaker I reviewed today's device interrogation. See PaceArt for details Rate response turned on today due to fatigue and flat histograms   Chronic heart failure with preserved ejection fraction Currently on torsemide  and metolazone .  Aortic stenosis status post bioprosthetic aortic valve replacement Continue follow-up in general cardiology clinic  CKD stage IV Limits potential rhythm control options for atrial fibrillation  COPD Has significant shortness of breath at times without overt volume overload Was admitted with  hypoxic/hypercarbic respiratory failure    Signed, Scotts FORBES Nancey, MD  01/23/2024 2:01 PM    [facility]

## 2024-01-23 NOTE — Patient Instructions (Signed)

## 2024-01-26 DIAGNOSIS — N184 Chronic kidney disease, stage 4 (severe): Secondary | ICD-10-CM | POA: Diagnosis not present

## 2024-01-26 DIAGNOSIS — I5032 Chronic diastolic (congestive) heart failure: Secondary | ICD-10-CM | POA: Diagnosis not present

## 2024-01-26 DIAGNOSIS — I129 Hypertensive chronic kidney disease with stage 1 through stage 4 chronic kidney disease, or unspecified chronic kidney disease: Secondary | ICD-10-CM | POA: Diagnosis not present

## 2024-01-26 DIAGNOSIS — N2581 Secondary hyperparathyroidism of renal origin: Secondary | ICD-10-CM | POA: Diagnosis not present

## 2024-02-03 ENCOUNTER — Ambulatory Visit: Payer: Self-pay | Admitting: Cardiovascular Disease

## 2024-02-17 ENCOUNTER — Ambulatory Visit: Payer: Medicare PPO | Attending: Nurse Practitioner | Admitting: Nurse Practitioner

## 2024-02-17 ENCOUNTER — Encounter: Payer: Self-pay | Admitting: Nurse Practitioner

## 2024-02-17 VITALS — BP 102/60 | HR 78 | Ht 63.0 in | Wt 243.7 lb

## 2024-02-17 DIAGNOSIS — I4819 Other persistent atrial fibrillation: Secondary | ICD-10-CM

## 2024-02-17 DIAGNOSIS — I10 Essential (primary) hypertension: Secondary | ICD-10-CM | POA: Diagnosis not present

## 2024-02-17 DIAGNOSIS — I5033 Acute on chronic diastolic (congestive) heart failure: Secondary | ICD-10-CM

## 2024-02-17 DIAGNOSIS — Z950 Presence of cardiac pacemaker: Secondary | ICD-10-CM | POA: Diagnosis not present

## 2024-02-17 DIAGNOSIS — Z953 Presence of xenogenic heart valve: Secondary | ICD-10-CM | POA: Diagnosis not present

## 2024-02-17 DIAGNOSIS — J449 Chronic obstructive pulmonary disease, unspecified: Secondary | ICD-10-CM

## 2024-02-17 DIAGNOSIS — E785 Hyperlipidemia, unspecified: Secondary | ICD-10-CM | POA: Diagnosis not present

## 2024-02-17 DIAGNOSIS — R0609 Other forms of dyspnea: Secondary | ICD-10-CM

## 2024-02-17 DIAGNOSIS — E875 Hyperkalemia: Secondary | ICD-10-CM

## 2024-02-17 DIAGNOSIS — I5032 Chronic diastolic (congestive) heart failure: Secondary | ICD-10-CM

## 2024-02-17 DIAGNOSIS — I890 Lymphedema, not elsewhere classified: Secondary | ICD-10-CM

## 2024-02-17 DIAGNOSIS — I2510 Atherosclerotic heart disease of native coronary artery without angina pectoris: Secondary | ICD-10-CM

## 2024-02-17 DIAGNOSIS — N184 Chronic kidney disease, stage 4 (severe): Secondary | ICD-10-CM

## 2024-02-17 NOTE — Patient Instructions (Addendum)
 Medication Instructions:  Your physician has recommended you make the following change in your medication:  Hold your potassium pill tomorrow, then restart this back on Thursday.   Take one Metolazone  tablet tomorrow morning and another one Thursday morning, then return to taking metolazone  PRN.  Labwork: In 1 week at [facility]   Testing/Procedures: none  Follow-Up: Your physician recommends that you schedule a follow-up appointment in: 4 weeks   Any Other Special Instructions Will Be Listed Below (If Applicable).  If you need a refill on your cardiac medications before your next appointment, please call your pharmacy.  HEART FAILURE INSTRUCTION SHEET  Follow a low-salt diet-you are allowed no more than 2,000 mg of sodium per day. Watch your fluid intake. In general, you should not be taking more than 64 ounces a day (no more than 8 glasses per day). Sometimes we refer to this as 2 liters per day. This includes sources of water in food like soup, coffee, tea, milk etc. Weigh yourself on the same scale at the same time of the day preferably immediately after your first void. Keep a log of your weights. Call your doctor: (Anytime you feel any of the following symptoms)  3 lbs weight gain overnight or 5 lbs within a week Shortness of breath, with or without a day hacking cough Swelling in hands, feet or stomach If you have to sleep on extra pillows at night in order to breathe   IT IS IMPORTANT TO LET YOUR DOCTOR KNOW EARLY ON IF YOU ARE HAVING SYMPTOMS SO WE CAN HELP YOU!

## 2024-02-17 NOTE — Progress Notes (Signed)
 Cardiology Office Note:   Date: 02/17/2024 ID:  [name], DOB October 11, 1940, MRN [id number] PCP:  Rosamond Leta NOVAK, MD [facility] Providers Cardiologist:  Jayson Sierras, MD Electrophysiologist:  Eulas FORBES Furbish, MD   Referring MD: Rosamond Leta NOVAK, MD  CC: Here for scheduled 4-6 month follow-up History of Present Illness:    [name] is a 83 y.o. female with a PMH of CAD, s/p CABG in 2009, HFpEF, AS, s/p bioprosthetic aortic valve replacement, tachybradycardia syndrome, s/p PPM, persistent A-fib, HTN, HLD, COPD, and CKD stage IV, who presents today for scheduled follow-up.   Has a remote history of CABG x 2 with SVG-RCA and LIMA-LAD as well as AVR in 2009.  NST in 2019 was negative for ischemia.  Echocardiogram in June 2023 showed normal EF, with dynamic LVOT and moderate symmetrical LVH.  Aortic valve was stable with normal function and mild stenosis/regurgitation.  She presented to [facility] in January 2024 with shortness of breath despite home use of O2 at 2 to 3 L and had associated chest pressure.  EKG was negative. Troponins were elevated with peak at 2,302. CXR revealed cardiomegaly with mild pulmonary vascular congestion mild by basilar and mid right lung atelectasis.  Noted increasing DOE over the past month. Transferred to [facility] due to concern for NSTEMI.  Diuresed in the ED.  TTE revealed EF 65 to 70%, grade 2 DD, stable aortic valve. Therapy recommended home health PT/OT. Metoprolol  was stopped due to patient being bradycardic with heart rate in 30s.   Hospital admission 12/2022 at [facility] for shortness of breath and acute on chronic hypoxic/hypercarbic respiratory failure, placed on BiPAP, diuresed and tx with steroids and nebulizer. EKG showed A-fib with slow ventricular rate in 40's, however was d/c on Toprol  12.5 mg daily. Trop were slightly elevated secondary to chronic heart failure. TTE was updated and full report noted below.  Last seen by Dr. Sierras on January 15, 2023.  She was doing well at that time and was diuresed well.  Was still using supplemental oxygen . Was instructed to take Metolazone  2.5 mg weekly, then as needed per discharge instructions.   Was evaluated by EP on February 20, 2023 and underwent PPM on 04/03/2023 for tachybradycardia syndrome.   05/15/2023 - Today she presents for follow-up. Doing better and leg swelling has improved. Denies any chest pain, worsening shortness of breath, palpitations, syncope, presyncope, dizziness, orthopnea, PND, swelling or significant weight changes, acute bleeding, or claudication. She is only taking Metolazone  2.5 mg once per week PRN for edema.   08/19/2023 - Doing well.  Denies any acute cardiac complaints or issues.  Weight is stable. Tolerating her medications well. Denies any chest pain, worsening shortness of breath, palpitations, syncope, presyncope, dizziness, orthopnea, PND, swelling or significant weight changes, acute bleeding, or claudication.  02/17/2024 - Here today for follow-up.  She says she has not been doing well recently and is feeling more weakness and no energy.  Weight is up 4 pounds within the past week.  Does also admit to increased leg swelling.  Admits to DOE. Denies any chest pain, palpitations, syncope, presyncope, dizziness, orthopnea, PND, acute bleeding, or claudication.  Please see the history of present illness.    All other systems reviewed and are negative.   EKGs/Labs/Other Studies Reviewed:    The following studies were reviewed today:  EKG: EKG is not ordered today.  Echo 12/2022 ([facility]): Summary 1. The left ventricle is normal in size with mildly to  moderately increased wall thickness. 2. The left ventricular systolic function is normal, LVEF is visually estimated at 65-70%. 3. There is grade II diastolic dysfunction (elevated filling pressure). 4. The mitral valve leaflets are mildly thickened with normal leaflet mobility. 5. Mitral annular calcification is present.  6. Aortic valve replacement (21 mm bioprosthetic, implantation date: 2009). 7. Aortic valve Doppler indices are consistent with normal prosthetic valve function. 8. The left atrium is moderately dilated in size. 9. The right ventricle is mildly dilated in size, with normal systolic function. 10. There is mild to moderate tricuspid regurgitation. 11. There is mild-moderate pulmonary hypertension. 12. The right atrium is mildly dilated in size. 13. IVC size and inspiratory change suggest mildly elevated right atrial pressure. (5-10 mmHg). Left Ventricle The left ventricle is normal in size with mildly to moderately increased wall thickness. The left ventricular systolic function is normal, LVEF is visually estimated at 65-70%. There is grade II diastolic dysfunction (elevated filling pressure). Right Ventricle The right ventricle is mildly dilated in size, with normal systolic function. Left Atrium The left atrium is moderately dilated in size. Right Atrium The right atrium is mildly dilated in size. Aortic Valve Aortic valve replacement (21 mm bioprosthetic, implantation date: 2009). The prosthetic aortic valve is well seated. Peak AV transvalvular velocity: 2.0 m/s. Mean gradient: 10 mmHg. Doppler velocity index: 0.47. LVOT diameter: 1.9 cm. LVOT stroke volume index: 33 ml/m2. AV estimated effective orifice area (by continuity equation): 1.5 cm2. There is trace regurgitation of the prosthetic aortic valve. Aortic valve Doppler indices are consistent with normal prosthetic valve function. AV Doppler velocity ratio (dimensionless index): 0.47. Mitral Valve The mitral valve leaflets are mildly thickened with normal leaflet mobility. Mitral annular calcification is present. There is trivial mitral valve regurgitation. Tricuspid Valve The tricuspid valve leaflets are normal, with normal leaflet mobility. There is mild to moderate tricuspid regurgitation. There is no tricuspid valve stenosis. There is mild-moderate  pulmonary hypertension. TR maximum velocity: 3.2 m/s Estimated PASP: 48 mmHg. Pulmonic Valve The pulmonic valve is normal. There is mild pulmonic regurgitation. There is no evidence of a significant transvalvular gradient. Aorta The aorta is normal in size in the visualized segments. Inferior Vena Cava IVC size and inspiratory change suggest mildly elevated right atrial pressure. (5-10 mmHg). Pericardium/Pleural There is no pericardial effusion.  Cardiac monitor on 07/31/2022: Predominant rhythm is sinus with IVCD.  Heart rate ranged from 33 bpm up to 123 bpm and average heart rate 64 bpm. Occasional PACs were noted representing 1.9% total beats with otherwise rare atrial couplets and triplets. Occasional PVCs representing 3.4% total beats were noted with otherwise rare ventricular couplets and triplets.  Also limited episodes of ventricular bigeminy and trigeminy. There were two brief episodes of NSVT noted, the longest of which lasted 6 beats. There were five episodes of PSVT noted, the longest of which lasted 20 beats. There were two pauses noted, the longest of which lasted 3.8 seconds (2:03 AM on February 1).  The second pause was 3 seconds in duration (2:53 PM on January 20).  Neither of these were patient triggered events.  Also intermittently noted junctional rhythm and possible idioventricular rhythm observed in the setting of bradycardia.   Limited Echo 07/05/2022:  1. Left ventricular ejection fraction, by estimation, is 65 to 70%. The  left ventricle has normal function. The left ventricle has no regional  wall motion abnormalities. There is moderate concentric left ventricular  hypertrophy. Indeterminate diastolic  filling due to E-A fusion.  2. Right ventricular systolic function was not well visualized. The right  ventricular size is not well visualized. There is normal pulmonary artery  systolic pressure. The estimated right ventricular systolic pressure is  12.1 mmHg.   3. The  mitral valve is degenerative. Trivial mitral valve regurgitation.  No evidence of mitral stenosis.   4. 21 mm Magna pericardial bioprosthetic aortic valve. Vmax 2.5 m/s, MG  14 mmHg, EOA 1.96 cm2, DI 0.62. Trivial regurgitation noted. The aortic  valve has been repaired/replaced. Aortic valve regurgitation is trivial.  Procedure Date: 12/10/2007.   5. The inferior vena cava is normal in size with greater than 50%  respiratory variability, suggesting right atrial pressure of 3 mmHg.   Comparison(s): No significant change from prior study.   Lexiscan  on 12/23/2017: There was no ST segment deviation noted during stress. Sinus rhythm with right bundle branch block and frequent PACs seen throughout study. Defect 1: There is a defect present in the mid anterior and apical anterior location. Images appear to be improved with stress consistent with reversed redistribution. There are no significant ischemic territories. This is a low risk study. Nuclear stress EF: 48%.  Physical Exam:    VS:  BP 102/60   Pulse 78   Ht 5' 3 (1.6 m)   Wt 243 lb 11.2 oz (110.5 kg)   LMP  (LMP Unknown)   SpO2 96% Comment: 2 liters  BMI 43.17 kg/m     Wt Readings from Last 3 Encounters:  02/17/24 243 lb 11.2 oz (110.5 kg)  01/23/24 240 lb (108.9 kg)  08/19/23 235 lb 9.6 oz (106.9 kg)   GEN: Obese, 83 y.o. female in no acute distress, wearing chronic O2 HEENT: Normal NECK: No JVD; No carotid bruits CARDIAC: S1/S2, RRR, Grade 2/6 murmur, no rubs, no gallops; 2+ pulses RESPIRATORY:  Overall clear and diminished, without wheezing or rhonchi  MUSCULOSKELETAL:  non pitting edema,  lymphedema R>L (chronic per patient's report and stable per her report); No deformity  SKIN: Warm and dry NEUROLOGIC:  Alert and oriented x 3 PSYCHIATRIC:  Normal affect   ASSESSMENT & PLAN:    In order of problems listed above:  Acute on chronic diastolic CHF, hyperkalemia Stage C, NYHA class III symptoms. Weight up 4 pounds  within the past week, does show some signs of volume overload. Continue current GDMT. Patient verbalized understanding.  Tells me she took metolazone  this morning and I have instructed her to take a total of 3 additional days of metolazone , then she will resume as needed.  Due to her recent hyperkalemia seen at the end of July, she will hold her potassium tablet tomorrow and then restart this back on Thursday.  Will obtain BMET, proBNP, magnesium  in 1 week.  GDMT limited with kidney disease. Low sodium diet, fluid restriction <2L, and daily weights encouraged. Educated to contact our office for weight gain of 2 lbs overnight or 5 lbs in one week.   CAD, s/p CABG in 2009 Stable with no anginal symptoms. No indication for ischemic evaluation. Continue current medication regimen. Heart healthy diet and regular cardiovascular exercise as tolerated encouraged.   Persistent A-fib, tachybradycardia syndrome, s/p PPM Denies any tachycardia or palpitations. Denies any issues.  Continue Eliquis  2.5 mg BID, on appropriate dosage.  Normal device interrogation seen with last device check.  Heart healthy diet and regular cardiovascular exercise as tolerated encouraged.  Follow-up with EP as scheduled.  AS, s/p bioprosthetic AVR Echo 12/2022 revealed 21 mm magna pericardial bioprosthetic  aortic valve with overall normal valvular function.  Continue current medication regimen. SBE prophylaxis discussed. Heart healthy diet and regular cardiovascular exercise as tolerated encouraged.  Plan to consider updating echocardiogram at next office visit if no improvement with her symptoms.  HTN Blood pressure stable.  Discussed to monitor BP at home at least 2 hours after medications and sitting for 5-10 minutes. Continue current medication regimen.  Low salt, heart healthy diet recommended.  HLD Past lipid panel revealed LDL 85. Continue atorvastatin  and fenofibrate . Heart healthy diet and regular cardiovascular exercise as  tolerated encouraged.   COPD, DOE Breathing is stable per her report.  Continue current medication regimen. Continue to f/u with Pulmonology and PCP. Continue oxygen  therapy.   CKD stage IV Recent sCr was relatively stable. Avoid nephrotoxic agents. No medication changes at this time. Continue to follow-up with Nephrology.  Obtaining labs as mentioned above.  9. Lymphedema More increased edema as noted above, she does have chronic lymphedema to RLE than LLE, pt says it has been chronic and stable since her surgery in 2009. Continue current medication regimen. Low salt, heart healthy diet recommended. Leg elevation recommended.  Did discuss lymphedema clinic with her and we will hold off at this time due to shared medical decision-will revisit this in the future.   Disposition: Follow-up with me or APP in 4 weeks or sooner if anything changes.    Medication Adjustments/Labs and Tests Ordered: Current medicines are reviewed at length with the patient today.  Concerns regarding medicines are outlined above.  Orders Placed This Encounter  Procedures   Basic Metabolic Panel (BMET)   Brain natriuretic peptide   Magnesium    No orders of the defined types were placed in this encounter.   Patient Instructions  Medication Instructions:  Your physician has recommended you make the following change in your medication:  Hold your potassium pill tomorrow, then restart this back on Thursday.   Take one Metolazone  tablet tomorrow morning and another one Thursday morning, then return to taking metolazone  PRN.  Labwork: In 1 week at [facility]   Testing/Procedures: none  Follow-Up: Your physician recommends that you schedule a follow-up appointment in: 4 weeks   Any Other Special Instructions Will Be Listed Below (If Applicable).  If you need a refill on your cardiac medications before your next appointment, please call your pharmacy.  HEART FAILURE INSTRUCTION SHEET  Follow a low-salt  diet-you are allowed no more than 2,000 mg of sodium per day. Watch your fluid intake. In general, you should not be taking more than 64 ounces a day (no more than 8 glasses per day). Sometimes we refer to this as 2 liters per day. This includes sources of water in food like soup, coffee, tea, milk etc. Weigh yourself on the same scale at the same time of the day preferably immediately after your first void. Keep a log of your weights. Call your doctor: (Anytime you feel any of the following symptoms)  3 lbs weight gain overnight or 5 lbs within a week Shortness of breath, with or without a day hacking cough Swelling in hands, feet or stomach If you have to sleep on extra pillows at night in order to breathe   IT IS IMPORTANT TO LET YOUR DOCTOR KNOW EARLY ON IF YOU ARE HAVING SYMPTOMS SO WE CAN HELP YOU!    Signed, Almarie Crate, NP

## 2024-02-25 DIAGNOSIS — I5030 Unspecified diastolic (congestive) heart failure: Secondary | ICD-10-CM | POA: Diagnosis not present

## 2024-02-25 DIAGNOSIS — R7889 Finding of other specified substances, not normally found in blood: Secondary | ICD-10-CM | POA: Diagnosis not present

## 2024-02-26 ENCOUNTER — Other Ambulatory Visit: Payer: Self-pay | Admitting: Cardiovascular Disease

## 2024-02-26 ENCOUNTER — Other Ambulatory Visit: Payer: Self-pay | Admitting: Cardiology

## 2024-03-08 ENCOUNTER — Other Ambulatory Visit: Payer: Self-pay | Admitting: Cardiovascular Disease

## 2024-03-19 ENCOUNTER — Encounter: Payer: Self-pay | Admitting: Nurse Practitioner

## 2024-03-19 ENCOUNTER — Ambulatory Visit: Attending: Nurse Practitioner | Admitting: Nurse Practitioner

## 2024-03-19 VITALS — BP 100/68 | HR 82 | Ht 63.0 in | Wt 240.8 lb

## 2024-03-19 DIAGNOSIS — I5032 Chronic diastolic (congestive) heart failure: Secondary | ICD-10-CM

## 2024-03-19 DIAGNOSIS — Z79899 Other long term (current) drug therapy: Secondary | ICD-10-CM

## 2024-03-19 DIAGNOSIS — R0609 Other forms of dyspnea: Secondary | ICD-10-CM | POA: Diagnosis not present

## 2024-03-19 DIAGNOSIS — Z950 Presence of cardiac pacemaker: Secondary | ICD-10-CM | POA: Diagnosis not present

## 2024-03-19 DIAGNOSIS — E785 Hyperlipidemia, unspecified: Secondary | ICD-10-CM | POA: Diagnosis not present

## 2024-03-19 DIAGNOSIS — I4819 Other persistent atrial fibrillation: Secondary | ICD-10-CM | POA: Diagnosis not present

## 2024-03-19 DIAGNOSIS — Z953 Presence of xenogenic heart valve: Secondary | ICD-10-CM

## 2024-03-19 DIAGNOSIS — J449 Chronic obstructive pulmonary disease, unspecified: Secondary | ICD-10-CM

## 2024-03-19 DIAGNOSIS — I2510 Atherosclerotic heart disease of native coronary artery without angina pectoris: Secondary | ICD-10-CM

## 2024-03-19 DIAGNOSIS — I890 Lymphedema, not elsewhere classified: Secondary | ICD-10-CM

## 2024-03-19 DIAGNOSIS — N184 Chronic kidney disease, stage 4 (severe): Secondary | ICD-10-CM

## 2024-03-19 DIAGNOSIS — I10 Essential (primary) hypertension: Secondary | ICD-10-CM | POA: Diagnosis not present

## 2024-03-19 MED ORDER — MAGNESIUM OXIDE -MG SUPPLEMENT 200 MG PO TABS: 1.0000 | ORAL_TABLET | Freq: Every day | ORAL | 3 refills | Status: DC

## 2024-03-19 NOTE — Progress Notes (Addendum)
 Cardiology Office Note:   Date: 03/19/2024 ID:  [name], DOB 1941/01/21, MRN [id number] PCP:  Rosamond Leta NOVAK, MD [facility] Providers Cardiologist:  Jayson Sierras, MD Electrophysiologist:  Eulas FORBES Furbish, MD   Referring MD: Rosamond Leta NOVAK, MD  CC: Here for scheduled 4 week follow-up History of Present Illness:    [name] is a 83 y.o. female with a PMH of CAD, s/p CABG in 2009, HFpEF, AS, s/p bioprosthetic aortic valve replacement, tachybradycardia syndrome, s/p PPM, persistent A-fib, HTN, HLD, COPD, and CKD stage IV, who presents today for scheduled 4 week follow-up.   Has a remote history of CABG x 2 with SVG-RCA and LIMA-LAD as well as AVR in 2009.  NST in 2019 was negative for ischemia.  Echocardiogram in June 2023 showed normal EF, with dynamic LVOT and moderate symmetrical LVH.  Aortic valve was stable with normal function and mild stenosis/regurgitation.  She presented to [facility] in January 2024 with shortness of breath despite home use of O2 at 2 to 3 L and had associated chest pressure.  EKG was negative. Troponins were elevated with peak at 2,302. CXR revealed cardiomegaly with mild pulmonary vascular congestion mild by basilar and mid right lung atelectasis.  Noted increasing DOE over the past month. Transferred to [facility] due to concern for NSTEMI.  Diuresed in the ED.  TTE revealed EF 65 to 70%, grade 2 DD, stable aortic valve. Therapy recommended home health PT/OT. Metoprolol  was stopped due to patient being bradycardic with heart rate in 30s.   Hospital admission 12/2022 at [facility] for shortness of breath and acute on chronic hypoxic/hypercarbic respiratory failure, placed on BiPAP, diuresed and tx with steroids and nebulizer. EKG showed A-fib with slow ventricular rate in 40's, however was d/c on Toprol  12.5 mg daily. Trop were slightly elevated secondary to chronic heart failure. TTE was updated and full report noted below.  Last seen by Dr. Sierras on January 15, 2023.  She was doing well at that time and was diuresed well.  Was still using supplemental oxygen . Was instructed to take Metolazone  2.5 mg weekly, then as needed per discharge instructions.   Was evaluated by EP on February 20, 2023 and underwent PPM on 04/03/2023 for tachybradycardia syndrome.   05/15/2023 - Today she presents for follow-up. Doing better and leg swelling has improved. Denies any chest pain, worsening shortness of breath, palpitations, syncope, presyncope, dizziness, orthopnea, PND, swelling or significant weight changes, acute bleeding, or claudication. She is only taking Metolazone  2.5 mg once per week PRN for edema.   08/19/2023 - Doing well.  Denies any acute cardiac complaints or issues.  Weight is stable. Tolerating her medications well. Denies any chest pain, worsening shortness of breath, palpitations, syncope, presyncope, dizziness, orthopnea, PND, swelling or significant weight changes, acute bleeding, or claudication.  02/17/2024 - Here today for follow-up.  She says she has not been doing well recently and is feeling more weakness and no energy.  Weight is up 4 pounds within the past week.  Does also admit to increased leg swelling.  Admits to DOE. Denies any chest pain, palpitations, syncope, presyncope, dizziness, orthopnea, PND, acute bleeding, or claudication.  03/19/2024 -  Here for follow-up.  She tells me she is doing much better since I last saw her and can tell she has had good diuresis, weight has come down 3 pounds since last office visit. Says leg swelling has improved greatly. Denies any recent chest pain, worsening shortness of  breath, palpitations, syncope, presyncope, dizziness, orthopnea, PND, swelling or significant weight changes, acute bleeding, or claudication.   Please see the history of present illness.    All other systems reviewed and are negative.   EKGs/Labs/Other Studies Reviewed:    The following studies were reviewed today:  EKG: EKG is  not ordered today.  Echo 12/2022 ([facility]): Summary 1. The left ventricle is normal in size with mildly to moderately increased wall thickness. 2. The left ventricular systolic function is normal, LVEF is visually estimated at 65-70%. 3. There is grade II diastolic dysfunction (elevated filling pressure). 4. The mitral valve leaflets are mildly thickened with normal leaflet mobility. 5. Mitral annular calcification is present. 6. Aortic valve replacement (21 mm bioprosthetic, implantation date: 2009). 7. Aortic valve Doppler indices are consistent with normal prosthetic valve function. 8. The left atrium is moderately dilated in size. 9. The right ventricle is mildly dilated in size, with normal systolic function. 10. There is mild to moderate tricuspid regurgitation. 11. There is mild-moderate pulmonary hypertension. 12. The right atrium is mildly dilated in size. 13. IVC size and inspiratory change suggest mildly elevated right atrial pressure. (5-10 mmHg). Left Ventricle The left ventricle is normal in size with mildly to moderately increased wall thickness. The left ventricular systolic function is normal, LVEF is visually estimated at 65-70%. There is grade II diastolic dysfunction (elevated filling pressure). Right Ventricle The right ventricle is mildly dilated in size, with normal systolic function. Left Atrium The left atrium is moderately dilated in size. Right Atrium The right atrium is mildly dilated in size. Aortic Valve Aortic valve replacement (21 mm bioprosthetic, implantation date: 2009). The prosthetic aortic valve is well seated. Peak AV transvalvular velocity: 2.0 m/s. Mean gradient: 10 mmHg. Doppler velocity index: 0.47. LVOT diameter: 1.9 cm. LVOT stroke volume index: 33 ml/m2. AV estimated effective orifice area (by continuity equation): 1.5 cm2. There is trace regurgitation of the prosthetic aortic valve. Aortic valve Doppler indices are consistent with normal prosthetic valve function. AV  Doppler velocity ratio (dimensionless index): 0.47. Mitral Valve The mitral valve leaflets are mildly thickened with normal leaflet mobility. Mitral annular calcification is present. There is trivial mitral valve regurgitation. Tricuspid Valve The tricuspid valve leaflets are normal, with normal leaflet mobility. There is mild to moderate tricuspid regurgitation. There is no tricuspid valve stenosis. There is mild-moderate pulmonary hypertension. TR maximum velocity: 3.2 m/s Estimated PASP: 48 mmHg. Pulmonic Valve The pulmonic valve is normal. There is mild pulmonic regurgitation. There is no evidence of a significant transvalvular gradient. Aorta The aorta is normal in size in the visualized segments. Inferior Vena Cava IVC size and inspiratory change suggest mildly elevated right atrial pressure. (5-10 mmHg). Pericardium/Pleural There is no pericardial effusion.  Cardiac monitor on 07/31/2022: Predominant rhythm is sinus with IVCD.  Heart rate ranged from 33 bpm up to 123 bpm and average heart rate 64 bpm. Occasional PACs were noted representing 1.9% total beats with otherwise rare atrial couplets and triplets. Occasional PVCs representing 3.4% total beats were noted with otherwise rare ventricular couplets and triplets.  Also limited episodes of ventricular bigeminy and trigeminy. There were two brief episodes of NSVT noted, the longest of which lasted 6 beats. There were five episodes of PSVT noted, the longest of which lasted 20 beats. There were two pauses noted, the longest of which lasted 3.8 seconds (2:03 AM on February 1).  The second pause was 3 seconds in duration (2:53 PM on January 20).  Neither of these  were patient triggered events.  Also intermittently noted junctional rhythm and possible idioventricular rhythm observed in the setting of bradycardia.   Limited Echo 07/05/2022:  1. Left ventricular ejection fraction, by estimation, is 65 to 70%. The  left ventricle has normal function. The  left ventricle has no regional  wall motion abnormalities. There is moderate concentric left ventricular  hypertrophy. Indeterminate diastolic  filling due to E-A fusion.   2. Right ventricular systolic function was not well visualized. The right  ventricular size is not well visualized. There is normal pulmonary artery  systolic pressure. The estimated right ventricular systolic pressure is  12.1 mmHg.   3. The mitral valve is degenerative. Trivial mitral valve regurgitation.  No evidence of mitral stenosis.   4. 21 mm Magna pericardial bioprosthetic aortic valve. Vmax 2.5 m/s, MG  14 mmHg, EOA 1.96 cm2, DI 0.62. Trivial regurgitation noted. The aortic  valve has been repaired/replaced. Aortic valve regurgitation is trivial.  Procedure Date: 12/10/2007.   5. The inferior vena cava is normal in size with greater than 50%  respiratory variability, suggesting right atrial pressure of 3 mmHg.   Comparison(s): No significant change from prior study.   Lexiscan  on 12/23/2017: There was no ST segment deviation noted during stress. Sinus rhythm with right bundle branch block and frequent PACs seen throughout study. Defect 1: There is a defect present in the mid anterior and apical anterior location. Images appear to be improved with stress consistent with reversed redistribution. There are no significant ischemic territories. This is a low risk study. Nuclear stress EF: 48%.  Physical Exam:    VS:  BP 100/68   Pulse 82   Ht 5' 3 (1.6 m)   Wt 240 lb 12.8 oz (109.2 kg)   LMP  (LMP Unknown)   SpO2 93%   BMI 42.66 kg/m     Wt Readings from Last 3 Encounters:  03/19/24 240 lb 12.8 oz (109.2 kg)  02/17/24 243 lb 11.2 oz (110.5 kg)  01/23/24 240 lb (108.9 kg)   GEN: Obese, 83 y.o. female in no acute distress, wearing chronic O2 HEENT: Normal NECK: No JVD; No carotid bruits CARDIAC: S1/S2, RRR, Grade 2/6 murmur, no rubs, no gallops; 2+ pulses RESPIRATORY:  Overall clear and diminished,  without wheezing or rhonchi  MUSCULOSKELETAL:  non pitting edema,  lymphedema R>L (chronic per patient's report and stable per her report); No deformity  SKIN: Warm and dry NEUROLOGIC:  Alert and oriented x 3 PSYCHIATRIC:  Normal affect   ASSESSMENT & PLAN:    In order of problems listed above:  Chronic diastolic CHF Stage C, NYHA class II symptoms. Weight is down, leg swelling has improved. Continue current GDMT. Patient verbalized understanding.  GDMT limited with kidney disease. Low sodium diet, fluid restriction <2L, and daily weights encouraged. Educated to contact our office for weight gain of 2 lbs overnight or 5 lbs in one week.   CAD, s/p CABG in 2009 Stable with no anginal symptoms. No indication for ischemic evaluation. Continue current medication regimen. Heart healthy diet and regular cardiovascular exercise as tolerated encouraged.   Persistent A-fib, tachybradycardia syndrome, s/p PPM Denies any tachycardia or palpitations. Denies any issues.  Continue Eliquis  2.5 mg BID, on appropriate dosage.  Normal device check seen most recently in clinic.  Heart healthy diet and regular cardiovascular exercise as tolerated encouraged.  Follow-up with EP as scheduled.  AS, s/p bioprosthetic AVR Echo 12/2022 revealed 21 mm magna pericardial bioprosthetic aortic valve with overall normal  valvular function.  Continue current medication regimen. SBE prophylaxis discussed. Heart healthy diet and regular cardiovascular exercise as tolerated encouraged.  HTN Blood pressure stable.  Discussed to monitor BP at home at least 2 hours after medications and sitting for 5-10 minutes. Continue current medication regimen.  Low salt, heart healthy diet recommended.  HLD Past lipid panel revealed LDL 85. Continue atorvastatin  and fenofibrate . Heart healthy diet and regular cardiovascular exercise as tolerated encouraged.   COPD, DOE Breathing is improved per her report.  Continue current medication  regimen. Continue to f/u with Pulmonology and PCP. Continue oxygen  therapy.   CKD stage IV Recent sCr was improved. Avoid nephrotoxic agents. No medication changes at this time. Continue to follow-up with Nephrology.    9. Lymphedema Improved since last office visit, she does have chronic lymphedema to RLE than LLE, pt says it has been chronic and stable since her surgery in 2009. Continue current medication regimen. Low salt, heart healthy diet recommended. Leg elevation recommended.    10.  Low magnesium  level, medication management Most recent magnesium  level 1.5.  Will begin magnesium  oxide 200 mg daily and check magnesium  level in 2 weeks.  Disposition: Follow-up with MD or APP in 2-3 months or sooner if anything changes.   Medication Adjustments/Labs and Tests Ordered: Current medicines are reviewed at length with the patient today.  Concerns regarding medicines are outlined above.  Orders Placed This Encounter  Procedures   Magnesium    Meds ordered this encounter  Medications   Magnesium  Oxide -Mg Supplement 200 MG TABS    Sig: Take 1 tablet (200 mg total) by mouth daily.    Dispense:  90 tablet    Refill:  3    New 03/19/24    Patient Instructions  Medication Instructions:   Begin Magnesium  oxide 200mg  daily  Continue all other medications.     Labwork:  Mg - order given today Please do in 2 weeks Office will contact with results via phone, letter or mychart.     Testing/Procedures:  none  Follow-Up:  2-3 months   Any Other Special Instructions Will Be Listed Below (If Applicable).  CHF info given   If you need a refill on your cardiac medications before your next appointment, please call your pharmacy.    Signed, Almarie Crate, NP

## 2024-03-19 NOTE — Patient Instructions (Addendum)
 Medication Instructions:   Begin Magnesium  oxide 200mg  daily  Continue all other medications.     Labwork:  Mg - order given today Please do in 2 weeks Office will contact with results via phone, letter or mychart.     Testing/Procedures:  none  Follow-Up:  2-3 months   Any Other Special Instructions Will Be Listed Below (If Applicable).  CHF info given   If you need a refill on your cardiac medications before your next appointment, please call your pharmacy.

## 2024-03-26 ENCOUNTER — Other Ambulatory Visit: Payer: Self-pay | Admitting: Nurse Practitioner

## 2024-04-01 DIAGNOSIS — I10 Essential (primary) hypertension: Secondary | ICD-10-CM | POA: Diagnosis not present

## 2024-04-01 DIAGNOSIS — J9611 Chronic respiratory failure with hypoxia: Secondary | ICD-10-CM | POA: Diagnosis not present

## 2024-04-01 DIAGNOSIS — N1832 Chronic kidney disease, stage 3b: Secondary | ICD-10-CM | POA: Diagnosis not present

## 2024-04-01 DIAGNOSIS — I5032 Chronic diastolic (congestive) heart failure: Secondary | ICD-10-CM | POA: Diagnosis not present

## 2024-04-01 DIAGNOSIS — Z299 Encounter for prophylactic measures, unspecified: Secondary | ICD-10-CM | POA: Diagnosis not present

## 2024-04-01 DIAGNOSIS — I4891 Unspecified atrial fibrillation: Secondary | ICD-10-CM | POA: Diagnosis not present

## 2024-04-01 NOTE — Progress Notes (Signed)
 Remote PPM Transmission

## 2024-04-02 ENCOUNTER — Ambulatory Visit: Payer: Medicare PPO

## 2024-04-02 DIAGNOSIS — I495 Sick sinus syndrome: Secondary | ICD-10-CM

## 2024-04-02 LAB — CUP PACEART REMOTE DEVICE CHECK
Battery Remaining Longevity: 161 mo
Battery Voltage: 3.1 V
Brady Statistic RA Percent Paced: 0.21 %
Brady Statistic RV Percent Paced: 8.66 %
Date Time Interrogation Session: 20251009233443
Implantable Lead Connection Status: 753985
Implantable Lead Connection Status: 753985
Implantable Lead Implant Date: 20241010
Implantable Lead Implant Date: 20241010
Implantable Lead Location: 753859
Implantable Lead Location: 753860
Implantable Lead Model: 3830
Implantable Lead Model: 5076
Implantable Pulse Generator Implant Date: 20241010
Lead Channel Impedance Value: 380 Ohm
Lead Channel Impedance Value: 494 Ohm
Lead Channel Impedance Value: 589 Ohm
Lead Channel Impedance Value: 646 Ohm
Lead Channel Pacing Threshold Amplitude: 0.75 V
Lead Channel Pacing Threshold Amplitude: 0.75 V
Lead Channel Pacing Threshold Pulse Width: 0.4 ms
Lead Channel Pacing Threshold Pulse Width: 0.4 ms
Lead Channel Sensing Intrinsic Amplitude: 30.625 mV
Lead Channel Sensing Intrinsic Amplitude: 30.625 mV
Lead Channel Sensing Intrinsic Amplitude: 4.125 mV
Lead Channel Sensing Intrinsic Amplitude: 4.125 mV
Lead Channel Setting Pacing Amplitude: 1.5 V
Lead Channel Setting Pacing Amplitude: 2 V
Lead Channel Setting Pacing Pulse Width: 0.4 ms
Lead Channel Setting Sensing Sensitivity: 1.2 mV
Zone Setting Status: 755011

## 2024-04-05 NOTE — Progress Notes (Signed)
 Remote PPM Transmission

## 2024-04-07 DIAGNOSIS — Z79899 Other long term (current) drug therapy: Secondary | ICD-10-CM | POA: Diagnosis not present

## 2024-04-08 ENCOUNTER — Ambulatory Visit: Payer: Self-pay | Admitting: Nurse Practitioner

## 2024-04-08 ENCOUNTER — Ambulatory Visit: Payer: Self-pay | Admitting: Cardiovascular Disease

## 2024-04-25 ENCOUNTER — Other Ambulatory Visit: Payer: Self-pay | Admitting: Cardiology

## 2024-04-26 DIAGNOSIS — N183 Chronic kidney disease, stage 3 unspecified: Secondary | ICD-10-CM | POA: Diagnosis not present

## 2024-04-26 DIAGNOSIS — D7589 Other specified diseases of blood and blood-forming organs: Secondary | ICD-10-CM | POA: Diagnosis not present

## 2024-04-26 DIAGNOSIS — E611 Iron deficiency: Secondary | ICD-10-CM | POA: Diagnosis not present

## 2024-04-26 DIAGNOSIS — D631 Anemia in chronic kidney disease: Secondary | ICD-10-CM | POA: Diagnosis not present

## 2024-04-26 DIAGNOSIS — R809 Proteinuria, unspecified: Secondary | ICD-10-CM | POA: Diagnosis not present

## 2024-05-03 DIAGNOSIS — E873 Alkalosis: Secondary | ICD-10-CM | POA: Diagnosis not present

## 2024-05-03 DIAGNOSIS — N2581 Secondary hyperparathyroidism of renal origin: Secondary | ICD-10-CM | POA: Diagnosis not present

## 2024-05-03 DIAGNOSIS — I5032 Chronic diastolic (congestive) heart failure: Secondary | ICD-10-CM | POA: Diagnosis not present

## 2024-05-03 DIAGNOSIS — N1832 Chronic kidney disease, stage 3b: Secondary | ICD-10-CM | POA: Diagnosis not present

## 2024-05-04 ENCOUNTER — Other Ambulatory Visit: Payer: Self-pay | Admitting: Nurse Practitioner

## 2024-05-12 ENCOUNTER — Telehealth: Payer: Self-pay | Admitting: Cardiology

## 2024-05-12 NOTE — Telephone Encounter (Signed)
 1. Which medications need to be refilled? (please list name of each medication and dose if known)  spironolactone   2. Which pharmacy/location (including street and city if local pharmacy) is medication to be sent to? [facility] 3. Do they need a 30 day or 90 day supply? 90   Pt is out

## 2024-05-13 MED ORDER — SPIRONOLACTONE 25 MG PO TABS: 12.5000 mg | ORAL_TABLET | Freq: Every day | ORAL | 1 refills | Status: DC

## 2024-05-13 NOTE — Telephone Encounter (Signed)
 Refill sent, per ok, Almarie Crate, NP.

## 2024-05-13 NOTE — Telephone Encounter (Signed)
 Calling in for update on the refill. Please advise

## 2024-06-21 ENCOUNTER — Ambulatory Visit: Attending: Nurse Practitioner | Admitting: Nurse Practitioner

## 2024-06-21 ENCOUNTER — Encounter: Payer: Self-pay | Admitting: Nurse Practitioner

## 2024-06-21 VITALS — BP 114/60 | HR 71 | Ht 63.0 in | Wt 236.0 lb

## 2024-06-21 DIAGNOSIS — J449 Chronic obstructive pulmonary disease, unspecified: Secondary | ICD-10-CM | POA: Diagnosis not present

## 2024-06-21 DIAGNOSIS — Z950 Presence of cardiac pacemaker: Secondary | ICD-10-CM

## 2024-06-21 DIAGNOSIS — E785 Hyperlipidemia, unspecified: Secondary | ICD-10-CM | POA: Diagnosis not present

## 2024-06-21 DIAGNOSIS — I4819 Other persistent atrial fibrillation: Secondary | ICD-10-CM | POA: Diagnosis not present

## 2024-06-21 DIAGNOSIS — Z953 Presence of xenogenic heart valve: Secondary | ICD-10-CM | POA: Diagnosis not present

## 2024-06-21 DIAGNOSIS — I890 Lymphedema, not elsewhere classified: Secondary | ICD-10-CM

## 2024-06-21 DIAGNOSIS — I2510 Atherosclerotic heart disease of native coronary artery without angina pectoris: Secondary | ICD-10-CM

## 2024-06-21 DIAGNOSIS — R0609 Other forms of dyspnea: Secondary | ICD-10-CM

## 2024-06-21 DIAGNOSIS — N184 Chronic kidney disease, stage 4 (severe): Secondary | ICD-10-CM

## 2024-06-21 DIAGNOSIS — I10 Essential (primary) hypertension: Secondary | ICD-10-CM

## 2024-06-21 DIAGNOSIS — I5032 Chronic diastolic (congestive) heart failure: Secondary | ICD-10-CM

## 2024-06-21 MED ORDER — MAGNESIUM OXIDE -MG SUPPLEMENT 200 MG PO TABS: 1.0000 | ORAL_TABLET | Freq: Every day | ORAL | 3 refills | Status: AC

## 2024-06-21 MED ORDER — SPIRONOLACTONE 25 MG PO TABS: 12.5000 mg | ORAL_TABLET | Freq: Every day | ORAL | 2 refills | Status: AC

## 2024-06-21 NOTE — Progress Notes (Unsigned)
 " Cardiology Office Note:   Date: 03/19/2024 ID:  MINIE ROADCAP, DOB 1940-07-02, MRN [id number] PCP:  Rosamond Leta NOVAK, MD [facility] Providers Cardiologist:  Jayson Sierras, MD Electrophysiologist:  Eulas FORBES Furbish, MD   Referring MD: Rosamond Leta NOVAK, MD  CC: Here for scheduled 4 week follow-up History of Present Illness:    [name] is a 83 y.o. female with a PMH of CAD, s/p CABG in 2009, HFpEF, AS, s/p bioprosthetic aortic valve replacement, tachybradycardia syndrome, s/p PPM, persistent A-fib, HTN, HLD, COPD, and CKD stage IV, who presents today for scheduled 4 week follow-up.   Has a remote history of CABG x 2 with SVG-RCA and LIMA-LAD as well as AVR in 2009.  NST in 2019 was negative for ischemia.  Echocardiogram in June 2023 showed normal EF, with dynamic LVOT and moderate symmetrical LVH.  Aortic valve was stable with normal function and mild stenosis/regurgitation.  She presented to [facility] in January 2024 with shortness of breath despite home use of O2 at 2 to 3 L and had associated chest pressure.  EKG was negative. Troponins were elevated with peak at 2,302. CXR revealed cardiomegaly with mild pulmonary vascular congestion mild by basilar and mid right lung atelectasis.  Noted increasing DOE over the past month. Transferred to [facility] due to concern for NSTEMI.  Diuresed in the ED.  TTE revealed EF 65 to 70%, grade 2 DD, stable aortic valve. Therapy recommended home health PT/OT. Metoprolol  was stopped due to patient being bradycardic with heart rate in 30s.   Hospital admission 12/2022 at [facility] for shortness of breath and acute on chronic hypoxic/hypercarbic respiratory failure, placed on BiPAP, diuresed and tx with steroids and nebulizer. EKG showed A-fib with slow ventricular rate in 40's, however was d/c on Toprol  12.5 mg daily. Trop were slightly elevated secondary to chronic heart failure. TTE was updated and full report noted below.  Last seen by Dr. Sierras on January 15, 2023.  She was doing well at that time and was diuresed well.  Was still using supplemental oxygen . Was instructed to take Metolazone  2.5 mg weekly, then as needed per discharge instructions.   Was evaluated by EP on February 20, 2023 and underwent PPM on 04/03/2023 for tachybradycardia syndrome.   05/15/2023 - Today she presents for follow-up. Doing better and leg swelling has improved. Denies any chest pain, worsening shortness of breath, palpitations, syncope, presyncope, dizziness, orthopnea, PND, swelling or significant weight changes, acute bleeding, or claudication. She is only taking Metolazone  2.5 mg once per week PRN for edema.   08/19/2023 - Doing well.  Denies any acute cardiac complaints or issues.  Weight is stable. Tolerating her medications well. Denies any chest pain, worsening shortness of breath, palpitations, syncope, presyncope, dizziness, orthopnea, PND, swelling or significant weight changes, acute bleeding, or claudication.  02/17/2024 - Here today for follow-up.  She says she has not been doing well recently and is feeling more weakness and no energy.  Weight is up 4 pounds within the past week.  Does also admit to increased leg swelling.  Admits to DOE. Denies any chest pain, palpitations, syncope, presyncope, dizziness, orthopnea, PND, acute bleeding, or claudication.  03/19/2024 -  Here for follow-up.  She tells me she is doing much better since I last saw her and can tell she has had good diuresis, weight has come down 3 pounds since last office visit. Says leg swelling has improved greatly. Denies any recent chest pain, worsening shortness  of breath, palpitations, syncope, presyncope, dizziness, orthopnea, PND, swelling or significant weight changes, acute bleeding, or claudication.   Please see the history of present illness.    All other systems reviewed and are negative.   EKGs/Labs/Other Studies Reviewed:    The following studies were reviewed today:  EKG: EKG is  not ordered today.  Echo 12/2022 ([facility]): Summary 1. The left ventricle is normal in size with mildly to moderately increased wall thickness. 2. The left ventricular systolic function is normal, LVEF is visually estimated at 65-70%. 3. There is grade II diastolic dysfunction (elevated filling pressure). 4. The mitral valve leaflets are mildly thickened with normal leaflet mobility. 5. Mitral annular calcification is present. 6. Aortic valve replacement (21 mm bioprosthetic, implantation date: 2009). 7. Aortic valve Doppler indices are consistent with normal prosthetic valve function. 8. The left atrium is moderately dilated in size. 9. The right ventricle is mildly dilated in size, with normal systolic function. 10. There is mild to moderate tricuspid regurgitation. 11. There is mild-moderate pulmonary hypertension. 12. The right atrium is mildly dilated in size. 13. IVC size and inspiratory change suggest mildly elevated right atrial pressure. (5-10 mmHg). Left Ventricle The left ventricle is normal in size with mildly to moderately increased wall thickness. The left ventricular systolic function is normal, LVEF is visually estimated at 65-70%. There is grade II diastolic dysfunction (elevated filling pressure). Right Ventricle The right ventricle is mildly dilated in size, with normal systolic function. Left Atrium The left atrium is moderately dilated in size. Right Atrium The right atrium is mildly dilated in size. Aortic Valve Aortic valve replacement (21 mm bioprosthetic, implantation date: 2009). The prosthetic aortic valve is well seated. Peak AV transvalvular velocity: 2.0 m/s. Mean gradient: 10 mmHg. Doppler velocity index: 0.47. LVOT diameter: 1.9 cm. LVOT stroke volume index: 33 ml/m2. AV estimated effective orifice area (by continuity equation): 1.5 cm2. There is trace regurgitation of the prosthetic aortic valve. Aortic valve Doppler indices are consistent with normal prosthetic valve function. AV  Doppler velocity ratio (dimensionless index): 0.47. Mitral Valve The mitral valve leaflets are mildly thickened with normal leaflet mobility. Mitral annular calcification is present. There is trivial mitral valve regurgitation. Tricuspid Valve The tricuspid valve leaflets are normal, with normal leaflet mobility. There is mild to moderate tricuspid regurgitation. There is no tricuspid valve stenosis. There is mild-moderate pulmonary hypertension. TR maximum velocity: 3.2 m/s Estimated PASP: 48 mmHg. Pulmonic Valve The pulmonic valve is normal. There is mild pulmonic regurgitation. There is no evidence of a significant transvalvular gradient. Aorta The aorta is normal in size in the visualized segments. Inferior Vena Cava IVC size and inspiratory change suggest mildly elevated right atrial pressure. (5-10 mmHg). Pericardium/Pleural There is no pericardial effusion.  Cardiac monitor on 07/31/2022: Predominant rhythm is sinus with IVCD.  Heart rate ranged from 33 bpm up to 123 bpm and average heart rate 64 bpm. Occasional PACs were noted representing 1.9% total beats with otherwise rare atrial couplets and triplets. Occasional PVCs representing 3.4% total beats were noted with otherwise rare ventricular couplets and triplets.  Also limited episodes of ventricular bigeminy and trigeminy. There were two brief episodes of NSVT noted, the longest of which lasted 6 beats. There were five episodes of PSVT noted, the longest of which lasted 20 beats. There were two pauses noted, the longest of which lasted 3.8 seconds (2:03 AM on February 1).  The second pause was 3 seconds in duration (2:53 PM on January 20).  Neither of  these were patient triggered events.  Also intermittently noted junctional rhythm and possible idioventricular rhythm observed in the setting of bradycardia.   Limited Echo 07/05/2022:  1. Left ventricular ejection fraction, by estimation, is 65 to 70%. The  left ventricle has normal function. The  left ventricle has no regional  wall motion abnormalities. There is moderate concentric left ventricular  hypertrophy. Indeterminate diastolic  filling due to E-A fusion.   2. Right ventricular systolic function was not well visualized. The right  ventricular size is not well visualized. There is normal pulmonary artery  systolic pressure. The estimated right ventricular systolic pressure is  12.1 mmHg.   3. The mitral valve is degenerative. Trivial mitral valve regurgitation.  No evidence of mitral stenosis.   4. 21 mm Magna pericardial bioprosthetic aortic valve. Vmax 2.5 m/s, MG  14 mmHg, EOA 1.96 cm2, DI 0.62. Trivial regurgitation noted. The aortic  valve has been repaired/replaced. Aortic valve regurgitation is trivial.  Procedure Date: 12/10/2007.   5. The inferior vena cava is normal in size with greater than 50%  respiratory variability, suggesting right atrial pressure of 3 mmHg.   Comparison(s): No significant change from prior study.   Lexiscan  on 12/23/2017: There was no ST segment deviation noted during stress. Sinus rhythm with right bundle branch block and frequent PACs seen throughout study. Defect 1: There is a defect present in the mid anterior and apical anterior location. Images appear to be improved with stress consistent with reversed redistribution. There are no significant ischemic territories. This is a low risk study. Nuclear stress EF: 48%.  Physical Exam:    VS:  BP 114/60 (BP Location: Left Arm, Cuff Size: Normal)   Pulse 71   Ht 5' 3 (1.6 m)   Wt 236 lb (107 kg)   LMP  (LMP Unknown)   SpO2 93% Comment: 2 LPM via [facility]  BMI 41.81 kg/m     Wt Readings from Last 3 Encounters:  06/21/24 236 lb (107 kg)  03/19/24 240 lb 12.8 oz (109.2 kg)  02/17/24 243 lb 11.2 oz (110.5 kg)   GEN: Obese, 83 y.o. female in no acute distress, wearing chronic O2 HEENT: Normal NECK: No JVD; No carotid bruits CARDIAC: S1/S2, RRR, Grade 2/6 murmur, no rubs, no gallops; 2+  pulses RESPIRATORY:  Overall clear and diminished, without wheezing or rhonchi  MUSCULOSKELETAL:  non pitting edema,  lymphedema R>L (chronic per patient's report and stable per her report); No deformity  SKIN: Warm and dry NEUROLOGIC:  Alert and oriented x 3 PSYCHIATRIC:  Normal affect   ASSESSMENT & PLAN:    In order of problems listed above:  Chronic diastolic CHF Stage C, NYHA class II symptoms. Weight is down, leg swelling has improved. Continue current GDMT. Patient verbalized understanding.  GDMT limited with kidney disease. Low sodium diet, fluid restriction <2L, and daily weights encouraged. Educated to contact our office for weight gain of 2 lbs overnight or 5 lbs in one week.   CAD, s/p CABG in 2009 Stable with no anginal symptoms. No indication for ischemic evaluation. Continue current medication regimen. Heart healthy diet and regular cardiovascular exercise as tolerated encouraged.   Persistent A-fib, tachybradycardia syndrome, s/p PPM Denies any tachycardia or palpitations. Denies any issues.  Continue Eliquis  2.5 mg BID, on appropriate dosage.  Normal device check seen most recently in clinic.  Heart healthy diet and regular cardiovascular exercise as tolerated encouraged.  Follow-up with EP as scheduled.  AS, s/p bioprosthetic AVR Echo 12/2022 revealed  21 mm magna pericardial bioprosthetic aortic valve with overall normal valvular function.  Continue current medication regimen. SBE prophylaxis discussed. Heart healthy diet and regular cardiovascular exercise as tolerated encouraged.  HTN Blood pressure stable.  Discussed to monitor BP at home at least 2 hours after medications and sitting for 5-10 minutes. Continue current medication regimen.  Low salt, heart healthy diet recommended.  HLD Past lipid panel revealed LDL 85. Continue atorvastatin  and fenofibrate . Heart healthy diet and regular cardiovascular exercise as tolerated encouraged.   COPD, DOE Breathing is  improved per her report.  Continue current medication regimen. Continue to f/u with Pulmonology and PCP. Continue oxygen  therapy.   CKD stage IV Recent sCr was improved. Avoid nephrotoxic agents. No medication changes at this time. Continue to follow-up with Nephrology.    9. Lymphedema Improved since last office visit, she does have chronic lymphedema to RLE than LLE, pt says it has been chronic and stable since her surgery in 2009. Continue current medication regimen. Low salt, heart healthy diet recommended. Leg elevation recommended.    10.  Low magnesium  level, medication management Most recent magnesium  level 1.5.  Will begin magnesium  oxide 200 mg daily and check magnesium  level in 2 weeks.  Disposition: Follow-up with MD or APP in 2-3 months or sooner if anything changes.   Medication Adjustments/Labs and Tests Ordered: Current medicines are reviewed at length with the patient today.  Concerns regarding medicines are outlined above.  No orders of the defined types were placed in this encounter.  No orders of the defined types were placed in this encounter.   There are no Patient Instructions on file for this visit.   Signed, Almarie Crate, NP  "

## 2024-06-21 NOTE — Patient Instructions (Signed)

## 2024-06-22 ENCOUNTER — Other Ambulatory Visit: Payer: Self-pay | Admitting: Cardiology

## 2024-06-22 NOTE — Telephone Encounter (Signed)
 Prescription refill request for Eliquis  received. Indication: AF Last office visit: 06/22/24  FORBES Crate NP Scr: 1.71 on 04/26/24  Epic Age: 83 Weight: 107kg  Based on above findings Eliquis  2.5mg  twice daily is the appropriate dose.  Refill approved.

## 2024-07-02 ENCOUNTER — Other Ambulatory Visit: Payer: Self-pay | Admitting: Nurse Practitioner

## 2024-07-02 ENCOUNTER — Other Ambulatory Visit (HOSPITAL_COMMUNITY): Payer: Self-pay

## 2024-07-02 ENCOUNTER — Ambulatory Visit: Payer: Medicare PPO

## 2024-07-02 ENCOUNTER — Telehealth: Payer: Self-pay | Admitting: Pharmacy Technician

## 2024-07-02 DIAGNOSIS — I5032 Chronic diastolic (congestive) heart failure: Secondary | ICD-10-CM

## 2024-07-02 NOTE — Telephone Encounter (Signed)
 Pharmacy Patient Advocate Encounter   Received notification from Fax that prior authorization for Jardiance  is required/requested.   Insurance verification completed.   The patient is insured through [employer].   Per test claim: PA required; PA submitted to above mentioned insurance via Latent Key/confirmation #/EOC [id number] Status is pending

## 2024-07-02 NOTE — Telephone Encounter (Signed)
 Pharmacy Patient Advocate Encounter  Received notification from [employer] that Prior Authorization for Jardiance  has been APPROVED from 06/24/24 to 06/23/25. Ran test claim, Copay is $40.00- one month. This test claim was processed through [facility]- copay amounts may vary at other pharmacies due to pharmacy/plan contracts, or as the patient moves through the different stages of their insurance plan.   PA #/Case ID/Reference #: [id number]

## 2024-07-04 LAB — CUP PACEART REMOTE DEVICE CHECK
Battery Remaining Longevity: 160 mo
Battery Voltage: 3.06 V
Brady Statistic RA Percent Paced: 0 %
Brady Statistic RV Percent Paced: 7.28 %
Date Time Interrogation Session: 20260108184539
Implantable Lead Connection Status: 753985
Implantable Lead Connection Status: 753985
Implantable Lead Implant Date: 20241010
Implantable Lead Implant Date: 20241010
Implantable Lead Location: 753859
Implantable Lead Location: 753860
Implantable Lead Model: 3830
Implantable Lead Model: 5076
Implantable Pulse Generator Implant Date: 20241010
Lead Channel Impedance Value: 342 Ohm
Lead Channel Impedance Value: 456 Ohm
Lead Channel Impedance Value: 570 Ohm
Lead Channel Impedance Value: 627 Ohm
Lead Channel Pacing Threshold Amplitude: 0.625 V
Lead Channel Pacing Threshold Amplitude: 0.75 V
Lead Channel Pacing Threshold Pulse Width: 0.4 ms
Lead Channel Pacing Threshold Pulse Width: 0.4 ms
Lead Channel Sensing Intrinsic Amplitude: 28.625 mV
Lead Channel Sensing Intrinsic Amplitude: 28.625 mV
Lead Channel Sensing Intrinsic Amplitude: 3.5 mV
Lead Channel Sensing Intrinsic Amplitude: 3.5 mV
Lead Channel Setting Pacing Amplitude: 1.5 V
Lead Channel Setting Pacing Amplitude: 2 V
Lead Channel Setting Pacing Pulse Width: 0.4 ms
Lead Channel Setting Sensing Sensitivity: 1.2 mV
Zone Setting Status: 755011

## 2024-07-06 ENCOUNTER — Ambulatory Visit: Payer: Self-pay | Admitting: Cardiovascular Disease

## 2024-07-06 NOTE — Progress Notes (Signed)
 Remote PPM Transmission

## 2024-12-06 ENCOUNTER — Ambulatory Visit: Admitting: Nurse Practitioner
